# Patient Record
Sex: Female | Born: 1955 | Race: Black or African American | Hispanic: No | Marital: Single | Smoking: Former smoker
Health system: Southern US, Community
[De-identification: ages and names within clinical notes are randomized; demographics above are authoritative.]

## PROBLEM LIST (undated history)

## (undated) DIAGNOSIS — H548 Legal blindness, as defined in USA: Secondary | ICD-10-CM

## (undated) DIAGNOSIS — I10 Essential (primary) hypertension: Secondary | ICD-10-CM

## (undated) DIAGNOSIS — G459 Transient cerebral ischemic attack, unspecified: Secondary | ICD-10-CM

## (undated) DIAGNOSIS — E119 Type 2 diabetes mellitus without complications: Secondary | ICD-10-CM

## (undated) DIAGNOSIS — S99929A Unspecified injury of unspecified foot, initial encounter: Secondary | ICD-10-CM

## (undated) HISTORY — PX: OTHER SURGICAL HISTORY: SHX169

---

## 2020-09-17 ENCOUNTER — Other Ambulatory Visit: Payer: Self-pay

## 2020-09-17 ENCOUNTER — Inpatient Hospital Stay (HOSPITAL_COMMUNITY)
Admission: EM | Admit: 2020-09-17 | Discharge: 2020-09-30 | DRG: 637 | Disposition: A | Discharge status: Skilled Nursing Facility | Payer: Medicare Other | Attending: Internal Medicine | Admitting: Internal Medicine

## 2020-09-17 ENCOUNTER — Emergency Department (HOSPITAL_COMMUNITY): Payer: Medicare Other

## 2020-09-17 ENCOUNTER — Encounter (HOSPITAL_COMMUNITY): Payer: Self-pay | Admitting: Emergency Medicine

## 2020-09-17 ENCOUNTER — Observation Stay (HOSPITAL_COMMUNITY): Payer: Medicare Other

## 2020-09-17 DIAGNOSIS — E1010 Type 1 diabetes mellitus with ketoacidosis without coma: Secondary | ICD-10-CM | POA: Diagnosis not present

## 2020-09-17 DIAGNOSIS — R748 Abnormal levels of other serum enzymes: Secondary | ICD-10-CM | POA: Diagnosis present

## 2020-09-17 DIAGNOSIS — M79674 Pain in right toe(s): Secondary | ICD-10-CM

## 2020-09-17 DIAGNOSIS — M7989 Other specified soft tissue disorders: Secondary | ICD-10-CM

## 2020-09-17 DIAGNOSIS — Z9119 Patient's noncompliance with other medical treatment and regimen: Secondary | ICD-10-CM

## 2020-09-17 DIAGNOSIS — F32A Depression, unspecified: Secondary | ICD-10-CM | POA: Diagnosis present

## 2020-09-17 DIAGNOSIS — Z7902 Long term (current) use of antithrombotics/antiplatelets: Secondary | ICD-10-CM

## 2020-09-17 DIAGNOSIS — Z87891 Personal history of nicotine dependence: Secondary | ICD-10-CM

## 2020-09-17 DIAGNOSIS — R4182 Altered mental status, unspecified: Secondary | ICD-10-CM

## 2020-09-17 DIAGNOSIS — Z515 Encounter for palliative care: Secondary | ICD-10-CM

## 2020-09-17 DIAGNOSIS — Z823 Family history of stroke: Secondary | ICD-10-CM

## 2020-09-17 DIAGNOSIS — R778 Other specified abnormalities of plasma proteins: Secondary | ICD-10-CM | POA: Diagnosis present

## 2020-09-17 DIAGNOSIS — Z794 Long term (current) use of insulin: Secondary | ICD-10-CM

## 2020-09-17 DIAGNOSIS — I10 Essential (primary) hypertension: Secondary | ICD-10-CM | POA: Diagnosis present

## 2020-09-17 DIAGNOSIS — R7989 Other specified abnormal findings of blood chemistry: Secondary | ICD-10-CM | POA: Diagnosis present

## 2020-09-17 DIAGNOSIS — E43 Unspecified severe protein-calorie malnutrition: Secondary | ICD-10-CM | POA: Diagnosis present

## 2020-09-17 DIAGNOSIS — E785 Hyperlipidemia, unspecified: Secondary | ICD-10-CM | POA: Diagnosis present

## 2020-09-17 DIAGNOSIS — G9340 Encephalopathy, unspecified: Secondary | ICD-10-CM | POA: Diagnosis present

## 2020-09-17 DIAGNOSIS — Z8673 Personal history of transient ischemic attack (TIA), and cerebral infarction without residual deficits: Secondary | ICD-10-CM

## 2020-09-17 DIAGNOSIS — G9341 Metabolic encephalopathy: Secondary | ICD-10-CM | POA: Diagnosis present

## 2020-09-17 DIAGNOSIS — H548 Legal blindness, as defined in USA: Secondary | ICD-10-CM | POA: Diagnosis present

## 2020-09-17 DIAGNOSIS — G928 Other toxic encephalopathy: Secondary | ICD-10-CM | POA: Diagnosis present

## 2020-09-17 DIAGNOSIS — Z79899 Other long term (current) drug therapy: Secondary | ICD-10-CM

## 2020-09-17 DIAGNOSIS — E876 Hypokalemia: Secondary | ICD-10-CM | POA: Diagnosis present

## 2020-09-17 DIAGNOSIS — F05 Delirium due to known physiological condition: Secondary | ICD-10-CM | POA: Diagnosis present

## 2020-09-17 DIAGNOSIS — E1110 Type 2 diabetes mellitus with ketoacidosis without coma: Secondary | ICD-10-CM | POA: Diagnosis present

## 2020-09-17 DIAGNOSIS — Z20822 Contact with and (suspected) exposure to covid-19: Secondary | ICD-10-CM | POA: Diagnosis present

## 2020-09-17 DIAGNOSIS — Z781 Physical restraint status: Secondary | ICD-10-CM

## 2020-09-17 DIAGNOSIS — D72829 Elevated white blood cell count, unspecified: Secondary | ICD-10-CM | POA: Diagnosis present

## 2020-09-17 DIAGNOSIS — M6282 Rhabdomyolysis: Secondary | ICD-10-CM | POA: Diagnosis present

## 2020-09-17 DIAGNOSIS — Z9114 Patient's other noncompliance with medication regimen: Secondary | ICD-10-CM

## 2020-09-17 DIAGNOSIS — R627 Adult failure to thrive: Secondary | ICD-10-CM | POA: Diagnosis present

## 2020-09-17 DIAGNOSIS — E860 Dehydration: Secondary | ICD-10-CM | POA: Diagnosis present

## 2020-09-17 HISTORY — DX: Transient cerebral ischemic attack, unspecified: G45.9

## 2020-09-17 HISTORY — DX: Unspecified injury of unspecified foot, initial encounter: S99.929A

## 2020-09-17 HISTORY — DX: Type 2 diabetes mellitus without complications: E11.9

## 2020-09-17 HISTORY — DX: Essential (primary) hypertension: I10

## 2020-09-17 HISTORY — DX: Legal blindness, as defined in USA: H54.8

## 2020-09-17 LAB — CBC WITH DIFFERENTIAL/PLATELET
Abs Immature Granulocytes: 0.17 10*3/uL — ABNORMAL HIGH (ref 0.00–0.07)
Basophils Absolute: 0 10*3/uL (ref 0.0–0.1)
Basophils Relative: 0 %
Eosinophils Absolute: 0 10*3/uL (ref 0.0–0.5)
Eosinophils Relative: 0 %
HCT: 41.8 % (ref 36.0–46.0)
Hemoglobin: 12.4 g/dL (ref 12.0–15.0)
Immature Granulocytes: 1 %
Lymphocytes Relative: 6 %
Lymphs Abs: 1.3 10*3/uL (ref 0.7–4.0)
MCH: 24.9 pg — ABNORMAL LOW (ref 26.0–34.0)
MCHC: 29.7 g/dL — ABNORMAL LOW (ref 30.0–36.0)
MCV: 84.1 fL (ref 80.0–100.0)
Monocytes Absolute: 1.4 10*3/uL — ABNORMAL HIGH (ref 0.1–1.0)
Monocytes Relative: 7 %
Neutro Abs: 17.9 10*3/uL — ABNORMAL HIGH (ref 1.7–7.7)
Neutrophils Relative %: 86 %
Platelets: 254 10*3/uL (ref 150–400)
RBC: 4.97 MIL/uL (ref 3.87–5.11)
RDW: 15.3 % (ref 11.5–15.5)
WBC: 20.7 10*3/uL — ABNORMAL HIGH (ref 4.0–10.5)
nRBC: 0 % (ref 0.0–0.2)

## 2020-09-17 LAB — URINALYSIS, ROUTINE W REFLEX MICROSCOPIC
Bilirubin Urine: NEGATIVE
Glucose, UA: >=500 mg/dL — AB
Ketones, ur: 80 mg/dL — AB
Leukocytes,Ua: NEGATIVE
Nitrite: NEGATIVE
Protein, ur: NEGATIVE mg/dL
Specific Gravity, Urine: 1.024 (ref 1.005–1.030)
pH: 5 (ref 5.0–8.0)

## 2020-09-17 LAB — BASIC METABOLIC PANEL
Anion gap: 20 — ABNORMAL HIGH (ref 5–15)
BUN: 25 mg/dL — ABNORMAL HIGH (ref 8–23)
CO2: 11 mmol/L — ABNORMAL LOW (ref 22–32)
Calcium: 9.6 mg/dL (ref 8.9–10.3)
Chloride: 111 mmol/L (ref 98–111)
Creatinine, Ser: 1.16 mg/dL — ABNORMAL HIGH (ref 0.44–1.00)
GFR, Estimated: 53 mL/min — ABNORMAL LOW (ref >60)
Glucose, Bld: 437 mg/dL — ABNORMAL HIGH (ref 70–99)
Potassium: 4.7 mmol/L (ref 3.5–5.1)
Sodium: 142 mmol/L (ref 135–145)

## 2020-09-17 LAB — COMPREHENSIVE METABOLIC PANEL
ALT: 30 U/L (ref 0–44)
AST: 40 U/L (ref 15–41)
Albumin: 4.3 g/dL (ref 3.5–5.0)
Alkaline Phosphatase: 76 U/L (ref 38–126)
Anion gap: 24 — ABNORMAL HIGH (ref 5–15)
BUN: 23 mg/dL (ref 8–23)
CO2: 11 mmol/L — ABNORMAL LOW (ref 22–32)
Calcium: 10 mg/dL (ref 8.9–10.3)
Chloride: 106 mmol/L (ref 98–111)
Creatinine, Ser: 1.11 mg/dL — ABNORMAL HIGH (ref 0.44–1.00)
GFR, Estimated: 56 mL/min — ABNORMAL LOW (ref >60)
Glucose, Bld: 510 mg/dL (ref 70–99)
Potassium: 4.7 mmol/L (ref 3.5–5.1)
Sodium: 141 mmol/L (ref 135–145)
Total Bilirubin: 1.9 mg/dL — ABNORMAL HIGH (ref 0.3–1.2)
Total Protein: 7.6 g/dL (ref 6.5–8.1)

## 2020-09-17 LAB — RAPID URINE DRUG SCREEN, HOSP PERFORMED
Amphetamines: NOT DETECTED
Barbiturates: NOT DETECTED
Benzodiazepines: NOT DETECTED
Cocaine: NOT DETECTED
Opiates: NOT DETECTED
Tetrahydrocannabinol: NOT DETECTED

## 2020-09-17 LAB — CBG MONITORING, ED
Glucose-Capillary: 141 mg/dL — ABNORMAL HIGH (ref 70–99)
Glucose-Capillary: 144 mg/dL — ABNORMAL HIGH (ref 70–99)
Glucose-Capillary: 257 mg/dL — ABNORMAL HIGH (ref 70–99)
Glucose-Capillary: 420 mg/dL — ABNORMAL HIGH (ref 70–99)
Glucose-Capillary: 447 mg/dL — ABNORMAL HIGH (ref 70–99)
Glucose-Capillary: 460 mg/dL — ABNORMAL HIGH (ref 70–99)
Glucose-Capillary: 471 mg/dL — ABNORMAL HIGH (ref 70–99)

## 2020-09-17 LAB — BLOOD GAS, VENOUS
Acid-base deficit: 14.1 mmol/L — ABNORMAL HIGH (ref 0.0–2.0)
Bicarbonate: 11.4 mmol/L — ABNORMAL LOW (ref 20.0–28.0)
O2 Saturation: 76.3 %
Patient temperature: 98.6
pCO2, Ven: 26.1 mmHg — ABNORMAL LOW (ref 44.0–60.0)
pH, Ven: 7.262 (ref 7.250–7.430)
pO2, Ven: 47.7 mmHg — ABNORMAL HIGH (ref 32.0–45.0)

## 2020-09-17 LAB — RESP PANEL BY RT-PCR (FLU A&B, COVID) ARPGX2
Influenza A by PCR: NEGATIVE
Influenza B by PCR: NEGATIVE
SARS Coronavirus 2 by RT PCR: NEGATIVE

## 2020-09-17 LAB — I-STAT CHEM 8, ED
BUN: 23 mg/dL (ref 8–23)
Calcium, Ion: 1.23 mmol/L (ref 1.15–1.40)
Chloride: 112 mmol/L — ABNORMAL HIGH (ref 98–111)
Creatinine, Ser: 0.7 mg/dL (ref 0.44–1.00)
Glucose, Bld: 522 mg/dL (ref 70–99)
HCT: 42 % (ref 36.0–46.0)
Hemoglobin: 14.3 g/dL (ref 12.0–15.0)
Potassium: 4.9 mmol/L (ref 3.5–5.1)
Sodium: 142 mmol/L (ref 135–145)
TCO2: 13 mmol/L — ABNORMAL LOW (ref 22–32)

## 2020-09-17 LAB — TROPONIN I (HIGH SENSITIVITY): Troponin I (High Sensitivity): 41 ng/L — ABNORMAL HIGH (ref <18)

## 2020-09-17 LAB — LACTIC ACID, PLASMA
Lactic Acid, Venous: 3.1 mmol/L (ref 0.5–1.9)
Lactic Acid, Venous: 2.4 mmol/L (ref 0.5–1.9)

## 2020-09-17 LAB — BETA-HYDROXYBUTYRIC ACID: Beta-Hydroxybutyric Acid: >8 mmol/L — ABNORMAL HIGH (ref 0.05–0.27)

## 2020-09-17 LAB — ETHANOL: Alcohol, Ethyl (B): <10 mg/dL (ref <10)

## 2020-09-17 LAB — CK: Total CK: 529 U/L — ABNORMAL HIGH (ref 38–234)

## 2020-09-17 MED ORDER — DEXTROSE IN LACTATED RINGERS 5 % IV SOLN: INTRAVENOUS | Status: DC

## 2020-09-17 MED ORDER — LACTATED RINGERS IV SOLN: INTRAVENOUS | Status: DC

## 2020-09-17 MED ORDER — INSULIN REGULAR(HUMAN) IN NACL 100-0.9 UT/100ML-% IV SOLN
INTRAVENOUS | Status: DC
  Administered 2020-09-17: 14 [IU]/h via INTRAVENOUS
  Administered 2020-09-18: 2.2 [IU]/h via INTRAVENOUS
  Filled 2020-09-17 (×2): qty 100

## 2020-09-17 MED ORDER — DEXTROSE 50 % IV SOLN
0.0000 mL | INTRAVENOUS | Status: DC | PRN
  Administered 2020-09-22: 50 mL via INTRAVENOUS

## 2020-09-17 MED ORDER — LORAZEPAM 2 MG/ML IJ SOLN
0.5000 mg | Freq: Once | INTRAMUSCULAR | Status: AC
  Administered 2020-09-17: 0.5 mg via INTRAVENOUS
  Filled 2020-09-17: qty 1

## 2020-09-17 MED ORDER — ONDANSETRON HCL 4 MG/2ML IJ SOLN
4.0000 mg | Freq: Once | INTRAMUSCULAR | Status: AC
  Administered 2020-09-17: 4 mg via INTRAVENOUS
  Filled 2020-09-17: qty 2

## 2020-09-17 MED ORDER — SODIUM CHLORIDE 0.9 % IV BOLUS
1000.0000 mL | Freq: Once | INTRAVENOUS | Status: AC
  Administered 2020-09-17: 1000 mL via INTRAVENOUS

## 2020-09-17 MED ORDER — LORAZEPAM 2 MG/ML IJ SOLN
0.5000 mg | Freq: Once | INTRAMUSCULAR | Status: AC
  Administered 2020-09-17: 0.5 mg via INTRAVENOUS

## 2020-09-17 MED ORDER — POTASSIUM CHLORIDE 10 MEQ/100ML IV SOLN
10.0000 meq | INTRAVENOUS | Status: AC
  Administered 2020-09-17 (×2): 10 meq via INTRAVENOUS
  Filled 2020-09-17 (×2): qty 100

## 2020-09-17 NOTE — ED Notes (Signed)
Attempted to take patient to CT, unsuccessful due to patients agitation and movement will try again later. Provider notified

## 2020-09-17 NOTE — ED Notes (Signed)
Patient transported to CT 

## 2020-09-17 NOTE — ED Provider Notes (Signed)
[facility]   CSN: [id number] Arrival date & time: 09/17/20  1717     History Chief Complaint  Patient presents with   Altered Mental Status    Donna AlarCheryl Stairs is a 65 y.o. female.  HPI      64yo female Type 1 DM, htn, depression, history of medication noncompliance presents with concern for altered mental status.   Per EMS they were called out for welfare check when family had not heard from her and found her laying on the floor, covered in feces and urine and altered. Initially only responsive to painful stimuli but becoming more awake and agitated during transport. IV fluid gien.  History limited by altered mental status.  Per sister on phone:   [place] [facility] [place], family begged her to not because she does not take care of herself, particularly with her insulin. She does not take it as she is supposed to, doesn't eat the way she should, tries to live off of lettuce and shrimp.  Ambulance was at her mother's house 3 times a week in [place] due to hypoglycemic episodes when she was living with her mother. Either takes too much insulin or not enough. Family was worried about her moving to [place] as they would not be able to help her.  Lives alone, no family here.  Has been a diabetic since she was 65yo. Thinks depression played role in he rmoving.   No drugs, no etoh while she was at her mom's, no cigarettes Coffee, tea, was drinking beer/wine occasional   They last spoke with her 2 days ago and she did not describe any medical concerns. Sister facetimed with her Sunday for brother's birthday and she was doing well.  Sister does report she has a history of leaving the hospital AMA once she has started improving.    Past Medical History:  Diagnosis Date   Diabetes [facility](HCC)    Hypertension     Patient Active Problem List   Diagnosis Date Noted   Essential hypertension 09/18/2020   Acute metabolic encephalopathy  09/18/2020   Elevated troponin 09/18/2020   Elevated CK 09/18/2020   Elevated lactic acid level 09/18/2020   DKA (diabetic ketoacidosis) (HCC) 09/17/2020    History reviewed. No pertinent surgical history.   OB History   No obstetric history on file.     History reviewed. No pertinent family history.  Social History   Tobacco Use   Smoking status: Never Smoker   Smokeless tobacco: Never Used  Substance Use Topics   Alcohol use: Not Currently    Home Medications Prior to Admission medications   Medication Sig Start Date End Date Taking? Authorizing Provider  amLODipine (NORVASC) 10 MG tablet Take 10 mg by mouth daily. 09/10/20   [provider]  atorvastatin (LIPITOR) 40 MG tablet Take 40 mg by mouth daily. 06/13/20   [provider]  clobetasol cream (TEMOVATE) 0.05 % Apply 1 application topically every other day. 09/05/20   [provider]  clopidogrel (PLAVIX) 75 MG tablet Take 75 mg by mouth daily. 06/13/20   [provider]  hydrocortisone valerate cream (WESTCORT) 0.2 % Apply 1 application topically See admin instructions. APPLY DAILY TO FACE FOR 5 DAY, THEN EVERY OTHER DAY FOR A WEEK, THEN REPEAT 09/08/20   [provider]  insulin lispro (HUMALOG) 100 UNIT/ML KwikPen SMARTSIG:Unit(s) SUB-Q As Directed 09/11/20   [provider]  LANTUS SOLOSTAR 100 UNIT/ML Solostar Pen Inject 8 Units  into the skin at bedtime. 09/05/20   [provider]  lisinopril (ZESTRIL) 20 MG tablet Take 20 mg by mouth daily. 05/30/20   [provider]    Allergies    Patient has no allergy information on record.  Review of Systems   Review of Systems  Unable to perform ROS: Mental status change  Constitutional: Negative for fever (per family 2 days ago).  Respiratory: Negative for cough (per family she did not report this 2 days ago).     Physical Exam Updated Vital Signs BP (!) 141/60    Pulse (!) 113    Temp 99.3  F (37.4 C) (Axillary)    Resp 16    Ht 5\' 5"  (1.651 m)    Wt 65.8 kg    SpO2 99%    BMI 24.13 kg/m   Physical Exam Vitals and nursing note reviewed.  Constitutional:      General: She is not in acute distress.    Appearance: She is well-developed and well-nourished. She is not diaphoretic.  HENT:     Head: Normocephalic.     Comments: Small erythematous mark on forehead Dry mucous membranes Eyes:     Extraocular Movements: EOM normal.     Conjunctiva/sclera: Conjunctivae normal.     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 Tachycardia present.     Pulses: Intact distal pulses.     Heart sounds: Normal heart sounds. No murmur heard. No friction rub. No gallop.   Pulmonary:     Effort: Pulmonary effort is normal. Tachypnea present. No respiratory distress.     Breath sounds: Normal breath sounds. No wheezing or rales.  Abdominal:     General: There is no distension.     Palpations: Abdomen is soft.     Tenderness: There is no abdominal tenderness. There is no guarding.  Musculoskeletal:        General: No tenderness or edema.     Cervical back: Normal range of motion.  Skin:    General: Skin is warm and dry.     Findings: No erythema or rash (a few erythematous papules lower leg).  Neurological:     Mental Status: She is alert.     Comments: States name, location, sleepy, not answering other questions     ED Results / Procedures / Treatments   Labs (all labs ordered are listed, but only abnormal results are displayed) Labs Reviewed  BETA-HYDROXYBUTYRIC ACID - Abnormal; Notable for the following components:      Result Value   Beta-Hydroxybutyric Acid >8.00 (*)    All other components within normal limits  CBC WITH DIFFERENTIAL/PLATELET - Abnormal; Notable for the following components:   WBC 20.7 (*)    MCH 24.9 (*)    MCHC 29.7 (*)    Neutro Abs 17.9 (*)    Monocytes Absolute 1.4 (*)    Abs Immature  Granulocytes 0.17 (*)    All other components within normal limits  URINALYSIS, ROUTINE W REFLEX MICROSCOPIC - Abnormal; Notable for the following components:   Color, Urine STRAW (*)    Glucose, UA >=500 (*)    Hgb urine dipstick SMALL (*)    Ketones, ur 80 (*)    Bacteria, UA RARE (*)    All other components within normal limits  LACTIC ACID, PLASMA - Abnormal; Notable for the following components:   Lactic Acid, Venous 3.1 (*)    All other components  within normal limits  COMPREHENSIVE METABOLIC PANEL - Abnormal; Notable for the following components:   CO2 11 (*)    Glucose, Bld 510 (*)    Creatinine, Ser 1.11 (*)    Total Bilirubin 1.9 (*)    GFR, Estimated 56 (*)    Anion gap 24 (*)    All other components within normal limits  CK - Abnormal; Notable for the following components:   Total CK 529 (*)    All other components within normal limits  BLOOD GAS, VENOUS - Abnormal; Notable for the following components:   pCO2, Ven 26.1 (*)    pO2, Ven 47.7 (*)    Bicarbonate 11.4 (*)    Acid-base deficit 14.1 (*)    All other components within normal limits  BASIC METABOLIC PANEL - Abnormal; Notable for the following components:   CO2 11 (*)    Glucose, Bld 437 (*)    BUN 25 (*)    Creatinine, Ser 1.16 (*)    GFR, Estimated 53 (*)    Anion gap 20 (*)    All other components within normal limits  LACTIC ACID, PLASMA - Abnormal; Notable for the following components:   Lactic Acid, Venous 2.4 (*)    All other components within normal limits  LACTIC ACID, PLASMA - Abnormal; Notable for the following components:   Lactic Acid, Venous 3.6 (*)    All other components within normal limits  BASIC METABOLIC PANEL - Abnormal; Notable for the following components:   Sodium 146 (*)    Chloride 116 (*)    CO2 15 (*)    Glucose, Bld 139 (*)    All other components within normal limits  BASIC METABOLIC PANEL - Abnormal; Notable for the following components:   Sodium 147 (*)    Chloride  120 (*)    CO2 20 (*)    Glucose, Bld 146 (*)    All other components within normal limits  BETA-HYDROXYBUTYRIC ACID - Abnormal; Notable for the following components:   Beta-Hydroxybutyric Acid 3.59 (*)    All other components within normal limits  HEMOGLOBIN A1C - Abnormal; Notable for the following components:   Hgb A1c MFr Bld 7.0 (*)    All other components within normal limits  CK - Abnormal; Notable for the following components:   Total CK 941 (*)    All other components within normal limits  MAGNESIUM - Abnormal; Notable for the following components:   Magnesium 2.5 (*)    All other components within normal limits  GLUCOSE, CAPILLARY - Abnormal; Notable for the following components:   Glucose-Capillary 124 (*)    All other components within normal limits  GLUCOSE, CAPILLARY - Abnormal; Notable for the following components:   Glucose-Capillary 165 (*)    All other components within normal limits  GLUCOSE, CAPILLARY - Abnormal; Notable for the following components:   Glucose-Capillary 156 (*)    All other components within normal limits  GLUCOSE, CAPILLARY - Abnormal; Notable for the following components:   Glucose-Capillary 125 (*)    All other components within normal limits  GLUCOSE, CAPILLARY - Abnormal; Notable for the following components:   Glucose-Capillary 135 (*)    All other components within normal limits  GLUCOSE, CAPILLARY - Abnormal; Notable for the following components:   Glucose-Capillary 165 (*)    All other components within normal limits  GLUCOSE, CAPILLARY - Abnormal; Notable for the following components:   Glucose-Capillary 133 (*)    All other components within  normal limits  CBG MONITORING, ED - Abnormal; Notable for the following components:   Glucose-Capillary 471 (*)    All other components within normal limits  I-STAT CHEM 8, ED - Abnormal; Notable for the following components:   Chloride 112 (*)    Glucose, Bld 522 (*)    TCO2 13 (*)     All other components within normal limits  CBG MONITORING, ED - Abnormal; Notable for the following components:   Glucose-Capillary 447 (*)    All other components within normal limits  CBG MONITORING, ED - Abnormal; Notable for the following components:   Glucose-Capillary 460 (*)    All other components within normal limits  CBG MONITORING, ED - Abnormal; Notable for the following components:   Glucose-Capillary 420 (*)    All other components within normal limits  CBG MONITORING, ED - Abnormal; Notable for the following components:   Glucose-Capillary 257 (*)    All other components within normal limits  CBG MONITORING, ED - Abnormal; Notable for the following components:   Glucose-Capillary 144 (*)    All other components within normal limits  CBG MONITORING, ED - Abnormal; Notable for the following components:   Glucose-Capillary 141 (*)    All other components within normal limits  TROPONIN I (HIGH SENSITIVITY) - Abnormal; Notable for the following components:   Troponin I (High Sensitivity) 41 (*)    All other components within normal limits  TROPONIN I (HIGH SENSITIVITY) - Abnormal; Notable for the following components:   Troponin I (High Sensitivity) 333 (*)    All other components within normal limits  TROPONIN I (HIGH SENSITIVITY) - Abnormal; Notable for the following components:   Troponin I (High Sensitivity) 383 (*)    All other components within normal limits  RESP PANEL BY RT-PCR (FLU A&B, COVID) ARPGX2  MRSA PCR SCREENING  URINE CULTURE  RAPID URINE DRUG SCREEN, HOSP PERFORMED  ETHANOL  AMMONIA  PHOSPHORUS  LACTIC ACID, PLASMA  HIV ANTIBODY (ROUTINE TESTING W REFLEX)  BASIC METABOLIC PANEL  BASIC METABOLIC PANEL  BETA-HYDROXYBUTYRIC ACID  BETA-HYDROXYBUTYRIC ACID  VITAMIN B1  TROPONIN I (HIGH SENSITIVITY)    EKG EKG Interpretation  Date/Time:  Wednesday September 17 2020 18:28:56 EST Ventricular Rate:  126 PR Interval:    QRS Duration: 79 QT  Interval:  309 QTC Calculation: 448 R Axis:   89 Text Interpretation: Sinus tachycardia LAE, consider biatrial enlargement Borderline right axis deviation Minimal ST depression, anterolateral leads No previous ECGs available Confirmed by Alvira Monday ([id number]) on 09/17/2020 10:27:13 PM   Radiology CT Head Wo Contrast  Result Date: 09/17/2020 CLINICAL DATA:  Found on floor altered EXAM: CT HEAD WITHOUT CONTRAST TECHNIQUE: Contiguous axial images were obtained from the base of the skull through the vertex without intravenous contrast. COMPARISON:  None. FINDINGS: Brain: No acute territorial infarction, hemorrhage, or intracranial mass. Mild atrophy. Minimal white matter hypodensity likely chronic small vessel ischemic change. The ventricles are nonenlarged Vascular: No hyperdense vessels.  Carotid vascular calcification Skull: Normal. Negative for fracture or focal lesion. Sinuses/Orbits: Abnormal hyperdense right globe with scleral band. Other: None IMPRESSION: 1. No CT evidence for acute intracranial abnormality. 2. Atrophy and minimal small vessel ischemic changes of the white matter. 3. Abnormal hyperdense right globe, correlate with eye exam Electronically Signed   By: Jasmine Pang M.D.   On: 09/17/2020 23:40   CT Cervical Spine Wo Contrast  Result Date: 09/17/2020 CLINICAL DATA:  Found on floor EXAM: CT CERVICAL SPINE WITHOUT CONTRAST TECHNIQUE:  Multidetector CT imaging of the cervical spine was performed without intravenous contrast. Multiplanar CT image reconstructions were also generated. COMPARISON:  None. FINDINGS: Alignment: Sagittal alignment shows trace anterolisthesis C3 on C4. Facet alignment is within normal limits. There is marked rotation of C1 on C2 presumably related to head positioning. Skull base and vertebrae: No acute fracture. No primary bone lesion or focal pathologic process. Soft tissues and spinal canal: No prevertebral fluid or swelling. No visible canal hematoma. Disc  levels: Mild degenerative changes C4-C5 and C6-C7. Facet degenerative change at multiple levels Upper chest: Negative. Other: None IMPRESSION: No fracture identified. There is marked rotation of C1 on C2 which is presumably related to head positioning. Electronically Signed   By: Jasmine Pang M.D.   On: 09/17/2020 23:46   DG Chest Portable 1 View  Result Date: 09/17/2020 CLINICAL DATA:  Altered level of consciousness, found down EXAM: PORTABLE CHEST 1 VIEW COMPARISON:  None. FINDINGS: The heart size and mediastinal contours are within normal limits. Both lungs are clear. The visualized skeletal structures are unremarkable. IMPRESSION: No active disease. Electronically Signed   By: Sharlet Salina M.D.   On: 09/17/2020 18:33   DG Foot 2 Views Right  Result Date: 09/17/2020 CLINICAL DATA:  Found down, hyperglycemia, pain and swelling right foot EXAM: RIGHT FOOT - 2 VIEW COMPARISON:  None. FINDINGS: Frontal and lateral views of the right foot are obtained. Postsurgical changes are seen from prior ankle and hindfoot fusion. There is diffuse osteoarthritis throughout the midfoot and forefoot. Hammertoe deformities are noted. There are no acute displaced fractures. Diffuse soft tissue edema. No destructive bony lesions or periosteal reaction to suggest osteomyelitis. IMPRESSION: 1. Postsurgical changes from ankle and hindfoot fusion. 2. Diffuse osteoarthritis.  No acute or destructive bony lesions. 3. Diffuse soft tissue swelling. Electronically Signed   By: Sharlet Salina M.D.   On: 09/17/2020 22:56    Procedures .Critical Care Performed by: Alvira Monday, MD Authorized by: Alvira Monday, MD   Critical care provider statement:    Critical care time (minutes):  45   Critical care was time spent personally by me on the following activities:  Discussions with consultants, evaluation of patient's response to treatment, examination of patient, ordering and performing treatments and interventions,  ordering and review of laboratory studies, ordering and review of radiographic studies, pulse oximetry, re-evaluation of patient's condition, obtaining history from patient or surrogate and review of old charts     Medications Ordered in ED Medications  insulin regular, human (MYXREDLIN) 100 units/ 100 mL infusion ( Intravenous Infusion Verify 09/18/20 0806)  lactated ringers infusion (0 mLs Intravenous Stopped 09/17/20 2305)  dextrose 5 % in lactated ringers infusion ( Intravenous New Bag/Given 09/17/20 2309)  dextrose 50 % solution 0-50 mL (has no administration in time range)  lactated ringers infusion (0 mLs Intravenous Stopped 09/18/20 0139)  dextrose 5 % in lactated ringers infusion (0 mLs Intravenous Hold 09/18/20 0053)  Chlorhexidine Gluconate Cloth 2 % PADS 6 each (has no administration in time range)  chlorhexidine (PERIDEX) 0.12 % solution 15 mL (15 mLs Mouth Rinse Given 09/18/20 0139)  MEDLINE mouth rinse (has no administration in time range)  MEDLINE mouth rinse (has no administration in time range)  thiamine (B-1) injection 100 mg (has no administration in time range)  sodium chloride 0.9 % bolus 1,000 mL (0 mLs Intravenous Stopped 09/17/20 2023)  LORazepam (ATIVAN) injection 0.5 mg (0.5 mg Intravenous Given 09/17/20 1832)  potassium chloride 10 mEq in 100 mL IVPB (  0 mEq Intravenous Stopped 09/17/20 2200)  ondansetron (ZOFRAN) injection 4 mg (4 mg Intravenous Given 09/17/20 1945)  LORazepam (ATIVAN) injection 0.5 mg (0.5 mg Intravenous Given 09/17/20 2158)  sodium chloride 0.9 % bolus 1,000 mL (0 mLs Intravenous Stopped 09/17/20 2300)  sodium chloride 0.9 % bolus 1,000 mL ( Intravenous Infusion Verify 09/18/20 0806)    ED Course  I have reviewed the triage vital signs and the nursing notes.  Pertinent labs & imaging results that were available during my care of the patient were reviewed by me and considered in my medical decision making (see chart for details).    MDM Rules/Calculators/A&P                           64yo female Type 1 DM, htn, depression, history of medication noncompliance presents with concern for altered mental status.  Labs consistent with DKA and per sister she has a history of nonadherence to insulin regimens and suspect this is etiology. When they last spoke with her she had no infectious symptoms, does not have signs of UTI or pneumonia.  Suspect leukocytosis and lactic acidosis are secondary to DKA and dehydration.  Given IV fluids, insulin gtt.  Is encephalopathic however protecting her airway and improving from arrival. Suspect encephalopathy also secondary to hyperglycemia and DKA.  Admitted to hospitalist service for further care.  CT head and CSpine ordered given possible fall.   Final Clinical Impression(s) / ED Diagnoses Final diagnoses:  Diabetic ketoacidosis without coma associated with type 1 diabetes mellitus (HCC)  Encephalopathy    Rx / DC Orders ED Discharge Orders    None       Alvira Monday, MD 09/18/20 [phone]

## 2020-09-17 NOTE — ED Triage Notes (Signed)
BIBA Per EMS:  Pt coming from home with altered mental status. Pt was found on floor by ems. Pt CBG was 474 upon EMS arrival. Pt hx diabetes. 20G R hand

## 2020-09-17 NOTE — H&P (Signed)
Donna Harrell [id number] DOB: October 06, 1955 DOA: 09/17/2020    PCP: Oneita HurtPcp, No   Outpatient Specialists:  NONE    Patient arrived to ER on 09/17/20 at 1717 Referred by Attending Alvira MondaySchlossman, Erin, MD   Patient coming from: home Lives alone,     Chief Complaint:  Chief Complaint  Patient presents with   Altered Mental Status    HPI: Donna AlarCheryl Harrell is a 65 y.o. female with medical history significant of DM1, depression, hypertension    Presented with confusion.  Patient does not have regular follow-up.  She lives alone per family has not been consistent in taking her insulin. Was found down after family requested safety check.  On EMS arrival noted to be covered in feces and urine.  Unclear how long she has been in that state. On EMS blood sugar was 474 [place] here on her own in December against her family's wishes. Family stated that she has had very poor diet mainly eating lettuce and shrimp. 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 likely secondary to medical noncompliance resulting in frequent hypoglycemic episodes Family denies that she has history of alcohol or drugs or smoking      Initial COVID TEST  NEGATIVE   Lab Results  Component Value Date   SARSCOV2NAA NEGATIVE 09/17/2020     Regarding pertinent Chronic problems:     HTN on unsure if taking any medications  DM 1- supposed to be on insulin but at this time unable to provide dosages or type of insulin she is taking    While in ER: Noted to be in DKA   Tachycardic up to 121 Beta hydroxybutyric acid above 8 Noted to have leukocytosis likely secondary to hemoconcentration no evidence of infection was noted no fever CK elevated at 529 Given IV bolus fluids  Patient was started on insulin drip Attempted to obtain CT of the head but patient was noncompliant pulling IVs and unable to lay still     Hospitalist was called for admission for DKA  The following Work  up has been ordered so far:  Orders Placed This Encounter  Procedures   Urine culture   Resp Panel by RT-PCR (Flu A&B, Covid) Nasopharyngeal Swab   DG Chest Portable 1 View   CT Head Wo Contrast   CT Cervical Spine Wo Contrast   CBC with Differential (PNL)   Urinalysis, Routine w reflex microscopic   Lactic acid, plasma   Comprehensive metabolic panel   Rapid urine drug screen (hospital performed)   Ethanol   CK   Blood gas, venous   Basic metabolic panel   Diet NPO time specified   Cardiac monitoring   Initiate Carrier Fluid Protocol   Notify physician   If present, discontinue Insulin Pump after IV Insulin is initiated.   Do NOT use lab glucose values in [facility].  If CBG meter reads "Critical High", enter 600.   Upon IV fluid bolus completion, place order for STAT BMET (LAB15) and call provider with results.   Notify physician   If present, discontinue Insulin Pump after IV Insulin is initiated.   Do NOT use lab glucose values in [employer].  If CBG meter reads "Critical High", enter 600.   IV insulin infusion with sufficient glucose should be continued until MD determines acidosis is corrected and places transition orders.   Upon IV fluid bolus completion, place order for STAT BMET (LAB15) and call provider with results.   Consult to  hospitalist   Airborne and Contact precautions   Pulse oximetry, continuous   CBG monitoring, ED   CBG monitoring, ED   I-stat chem 8, ED (not at [facility] or [facility])   CBG monitoring, ED   CBG monitoring, ED   CBG monitoring, ED   CBG monitoring, ED   ED EKG   Insert peripheral IV   Insert peripheral IV    Following Medications were ordered in ER: Medications  insulin regular, human (MYXREDLIN) 100 units/ 100 mL infusion (14 Units/hr Intravenous New Bag/Given 09/17/20 1900)  lactated ringers infusion ( Intravenous New Bag/Given 09/17/20 1849)  dextrose 5 % in lactated ringers infusion (0 mLs Intravenous Hold  09/17/20 1945)  dextrose 50 % solution 0-50 mL (has no administration in time range)  LORazepam (ATIVAN) injection 0.5 mg (has no administration in time range)  sodium chloride 0.9 % bolus 1,000 mL (0 mLs Intravenous Stopped 09/17/20 2023)  LORazepam (ATIVAN) injection 0.5 mg (0.5 mg Intravenous Given 09/17/20 1832)  potassium chloride 10 mEq in 100 mL IVPB (10 mEq Intravenous New Bag/Given 09/17/20 1858)  ondansetron (ZOFRAN) injection 4 mg (4 mg Intravenous Given 09/17/20 1945)        Consult Orders  (From admission, onward)         Start     Ordered   09/17/20 2128  Consult to hospitalist  Once       Provider:  (Not yet assigned)  Question Answer Comment  Place call to: [facility]   Reason for Consult Admit      09/17/20 2127          Significant initial  Findings: Abnormal Labs Reviewed  BETA-HYDROXYBUTYRIC ACID - Abnormal; Notable for the following components:      Result Value   Beta-Hydroxybutyric Acid >8.00 (*)    All other components within normal limits  CBC WITH DIFFERENTIAL/PLATELET - Abnormal; Notable for the following components:   WBC 20.7 (*)    MCH 24.9 (*)    MCHC 29.7 (*)    Neutro Abs 17.9 (*)    Monocytes Absolute 1.4 (*)    Abs Immature Granulocytes 0.17 (*)    All other components within normal limits  URINALYSIS, ROUTINE W REFLEX MICROSCOPIC - Abnormal; Notable for the following components:   Color, Urine STRAW (*)    Glucose, UA >=500 (*)    Hgb urine dipstick SMALL (*)    Ketones, ur 80 (*)    Bacteria, UA RARE (*)    All other components within normal limits  LACTIC ACID, PLASMA - Abnormal; Notable for the following components:   Lactic Acid, Venous 3.1 (*)    All other components within normal limits  COMPREHENSIVE METABOLIC PANEL - Abnormal; Notable for the following components:   CO2 11 (*)    Glucose, Bld 510 (*)    Creatinine, Ser 1.11 (*)    Total Bilirubin 1.9 (*)    GFR, Estimated 56 (*)    Anion gap 24 (*)    All other  components within normal limits  CK - Abnormal; Notable for the following components:   Total CK 529 (*)    All other components within normal limits  BLOOD GAS, VENOUS - Abnormal; Notable for the following components:   pCO2, Ven 26.1 (*)    pO2, Ven 47.7 (*)    Bicarbonate 11.4 (*)    Acid-base deficit 14.1 (*)    All other components within normal limits  BASIC METABOLIC PANEL - Abnormal; Notable for the  following components:   CO2 11 (*)    Glucose, Bld 437 (*)    BUN 25 (*)    Creatinine, Ser 1.16 (*)    GFR, Estimated 53 (*)    Anion gap 20 (*)    All other components within normal limits  CBG MONITORING, ED - Abnormal; Notable for the following components:   Glucose-Capillary 471 (*)    All other components within normal limits  I-STAT CHEM 8, ED - Abnormal; Notable for the following components:   Chloride 112 (*)    Glucose, Bld 522 (*)    TCO2 13 (*)    All other components within normal limits  CBG MONITORING, ED - Abnormal; Notable for the following components:   Glucose-Capillary 447 (*)    All other components within normal limits  CBG MONITORING, ED - Abnormal; Notable for the following components:   Glucose-Capillary 460 (*)    All other components within normal limits  CBG MONITORING, ED - Abnormal; Notable for the following components:   Glucose-Capillary 420 (*)    All other components within normal limits    Otherwise labs showing:    Recent Labs  Lab 09/17/20 1720 09/17/20 1829 09/17/20 2045  NA 141 142 142  K 4.7 4.9 4.7  CO2 11*  --  11*  GLUCOSE 510* 522* 437*  BUN 23 23 25*  CREATININE 1.11* 0.70 1.16*  CALCIUM 10.0  --  9.6    Cr    Up from baseline see below Lab Results  Component Value Date   CREATININE 1.16 (H) 09/17/2020   CREATININE 0.70 09/17/2020   CREATININE 1.11 (H) 09/17/2020    Recent Labs  Lab 09/17/20 1720  AST 40  ALT 30  ALKPHOS 76  BILITOT 1.9*  PROT 7.6  ALBUMIN 4.3   Lab Results  Component Value Date    CALCIUM 9.6 09/17/2020        WBC      Component Value Date/Time   WBC 20.7 (H) 09/17/2020 1720   LYMPHSABS 1.3 09/17/2020 1720   MONOABS 1.4 (H) 09/17/2020 1720   EOSABS 0.0 09/17/2020 1720   BASOSABS 0.0 09/17/2020 1720     Plt: Lab Results  Component Value Date   PLT 254 09/17/2020     Lactic Acid, Venous    Component Value Date/Time   LATICACIDVEN 3.1 (HH) 09/17/2020 1932   Lactic Acid, Venous    Component Value Date/Time   LATICACIDVEN 2.4 (HH) 09/17/2020 2310    Venous  Blood Gas result:  pH 7.262 pCO2 26    ABG    Component Value Date/Time   HCO3 11.4 (L) 09/17/2020 2045   TCO2 13 (L) 09/17/2020 1829   ACIDBASEDEF 14.1 (H) 09/17/2020 2045   O2SAT 76.3 09/17/2020 2045      HG/HCT  stable,       Component Value Date/Time   HGB 14.3 09/17/2020 1829   HCT 42.0 09/17/2020 1829   MCV 84.1 09/17/2020 1720      Troponin 41 Cardiac Panel (last 3 results) Recent Labs    09/17/20 1720  CKTOTAL 529*       ECG: Ordered Personally reviewed by me showing: HR : 126 Rhythm:  Sinus tachycardia    , no evidence of ischemic changes QTC 448    DM  labs:  HbA1C: No results for input(s): HGBA1C in the last 8760 hours.     CBG (last 3)  Recent Labs    09/17/20 2154 09/17/20 2302 09/17/20 2354  GLUCAP 257* 144* 141*       UA no evidence of UTI      Urine analysis:    Component Value Date/Time   COLORURINE STRAW (A) 09/17/2020 1932   APPEARANCEUR CLEAR 09/17/2020 1932   LABSPEC 1.024 09/17/2020 1932   PHURINE 5.0 09/17/2020 1932   GLUCOSEU >=500 (A) 09/17/2020 1932   HGBUR SMALL (A) 09/17/2020 1932   BILIRUBINUR NEGATIVE 09/17/2020 1932   KETONESUR 80 (A) 09/17/2020 1932   PROTEINUR NEGATIVE 09/17/2020 1932   NITRITE NEGATIVE 09/17/2020 1932   LEUKOCYTESUR NEGATIVE 09/17/2020 1932    CT HEAD  Ordered   CXR - NON acute  Right foot - imaging ordered    ED Triage Vitals  Enc Vitals Group     BP 09/17/20 1807 (!) 153/71     Pulse  Rate 09/17/20 1807 (!) 126     Resp 09/17/20 1807 13     Temp 09/17/20 1807 98.7 F (37.1 C)     Temp Source 09/17/20 1807 Rectal     SpO2 09/17/20 1807 100 %     Weight 09/17/20 1856 145 lb (65.8 kg)     Height 09/17/20 1856  (1.651 m)     Head Circumference --      Peak Flow --      Pain Score 09/17/20 1757 0     Pain Loc --      Pain Edu? --      Excl. in GC? --   TMAX(24)@       Latest  Blood pressure (!) 143/115, pulse (!) 122, temperature 98.7 F (37.1 C), temperature source Rectal, resp. rate 19, height  (1.651 m), weight 65.8 kg, SpO2 100 %.      Review of Systems:    Pertinent positives include: confusion  Constitutional:  No weight loss, night sweats, Fevers, chills, fatigue, weight loss  HEENT:  No headaches, Difficulty swallowing,Tooth/dental problems,Sore throat,  No sneezing, itching, ear ache, nasal congestion, post nasal drip,  Cardio-vascular:  No chest pain, Orthopnea, PND, anasarca, dizziness, palpitations.no Bilateral lower extremity swelling  GI:  No heartburn, indigestion, abdominal pain, nausea, vomiting, diarrhea, change in bowel habits, loss of appetite, melena, blood in stool, hematemesis Resp:  no shortness of breath at rest. No dyspnea on exertion, No excess mucus, no productive cough, No non-productive cough, No coughing up of blood.No change in color of mucus.No wheezing. Skin:  no rash or lesions. No jaundice GU:  no dysuria, change in color of urine, no urgency or frequency. No straining to urinate.  No flank pain.  Musculoskeletal:  No joint pain or no joint swelling. No decreased range of motion. No back pain.  Psych:  No change in mood or affect. No depression or anxiety. No memory loss.  Neuro: no localizing neurological complaints, no tingling, no weakness, no double vision, no gait abnormality, no slurred speech, no confusion  All systems reviewed and apart from HOPI all are negative  Past Medical History:  History  reviewed. No pertinent past medical history.     Social History:  Ambulatory   Independently     reports that she has never smoked. She has never used smokeless tobacco. She reports previous alcohol use. No history on file for drug use.     Family History: Unable to obtain due to pt mental status Allergies: patient unable to provide, family unreachable by phone  Prior to Admission medications   Not on File   Physical Exam: Vitals with BMI  09/17/2020 09/17/2020 09/17/2020  Height - - -  Weight - - -  BMI - - -  Systolic 143 122 604  Diastolic 115 63 79  Pulse 122 540 127     1. General:  in   Acute distress  agitated  Chronically ill  -appearing 2. Psychological: Alert not Oriented 3. Head/ENT:     Dry Mucous Membranes                          Head Non traumatic, neck supple                            Poor Dentition                               PERLA 4. SKIN:  decreased Skin turgor,  Skin clean Dry and intact no rash 5. Heart: Regular rate and rhythm no  Murmur, no Rub or gallop 6. Lungs:  no wheezes or crackles   7. Abdomen: Soft,  non-tender, Non distended bowel sounds present 8. Lower extremities: no clubbing, cyanosis,trace edema 9. Neurologically Grossly intact, moving all 4 extremities equally   10. MSK: Normal range of motion   All other LABS:     Recent Labs  Lab 09/17/20 1720 09/17/20 1829  WBC 20.7*  --   NEUTROABS 17.9*  --   HGB 12.4 14.3  HCT 41.8 42.0  MCV 84.1  --   PLT 254  --      Recent Labs  Lab 09/17/20 1720 09/17/20 1829 09/17/20 2045  NA 141 142 142  K 4.7 4.9 4.7  CL 106 112* 111  CO2 11*  --  11*  GLUCOSE 510* 522* 437*  BUN 23 23 25*  CREATININE 1.11* 0.70 1.16*  CALCIUM 10.0  --  9.6     Recent Labs  Lab 09/17/20 1720  AST 40  ALT 30  ALKPHOS 76  BILITOT 1.9*  PROT 7.6  ALBUMIN 4.3       Cultures: No results found for: SDES, SPECREQUEST, CULT, REPTSTATUS   Radiological Exams on Admission: DG Chest Portable  1 View  Result Date: 09/17/2020 CLINICAL DATA:  Altered level of consciousness, found down EXAM: PORTABLE CHEST 1 VIEW COMPARISON:  None. FINDINGS: The heart size and mediastinal contours are within normal limits. Both lungs are clear. The visualized skeletal structures are unremarkable. IMPRESSION: No active disease. Electronically Signed   By: Sharlet Salina M.D.   On: 09/17/2020 18:33    Chart has been reviewed    Assessment/Plan  65 y.o. female with medical history significant of DM1, depression, hypertension  Admitted for DKA and acute encephalopathy  Present on Admission:  DKA (diabetic ketoacidosis) (HCC) - will admit per DKA  protocol, obtain serial BMET, start on glucosestabalizer, aggressive IVF.   Change IVF to D5 1/2Na after BG <250 .  So far work up of possible causes of DKA/HSS with CXR, ECG one set of cardiac enzymes, UA.   Most likely cause been noncompliance Monitor in Stepdown. Replace potassium as needed.    Consult diabetes coordinator     Essential hypertension - continue to monitor BP currently stable   Acute metabolic encephalopathy -likely combination of dehydration DKA, unclear baseline Check ammonia level CT head showed no acute intracranial abnormalities If patient able to tolerate may need MRI in the future  CT head abnormal- right eye hyperdensity of unclear significance Pupil appeared to be reactive patient unable to provide any history She does have underlying poorly controlled diabetes At this point would not tolerate ophthalmological examination Would obtain ophthalmology consult in the a.m. once patient mental status hopefully improves No evidence of trauma around the eye noted   Elevated troponin -no evidence of ischemia on EKG continue to follow cycle cardiac enzymes obtain echogram   Elevated CK -rehydrate and continue to follow   Elevated lactic acid level likely in the setting of dehydration will rehydrate and  follow  Leukocytosis unclear etiology at this point no evidence of infectious process we will continue to follow  Patient was on Plavix will need to speak with family once able to to obtain more history of why patient is taking it. Given abnormal CT hold off on Plavix for tonight until cleared by ophthalmology  Other plan as per orders.  DVT prophylaxis:  SCD     Code Status:    Code Status: Not on file FULL CODE     Family Communication:   Family not at  Bedside  attempted to call with no answer  Disposition Plan:       To home once workup is complete and patient is stable   Following barriers for discharge:                            Electrolytes corrected                                                         Will need consultants to evaluate patient prior to discharge                         Would benefit from PT/OT eval prior to DC  Ordered                                       Diabetes care coordinator                  Consults called: none, will need ophthalmology consult in AM   Admission status:  ED Disposition    ED Disposition Condition Comment   Admit  Hospital Area: [facility] [[id number]]  Level of Care: Stepdown [14]  Admit to SDU based on following criteria: Hemodynamic compromise or significant risk of instability:  Patient requiring short term acute titration and management of vasoactive drips, and invasive monitoring (i.e., CVP and Arterial line).  Covid Evaluation: Confirmed COVID Negative  Diagnosis: DKA (diabetic ketoacidosis) (HCC) [891694]  Admitting Physician: Therisa Doyne [[id number]]  Attending Physician: Therisa Doyne [[id number]]       Obs     Level of care         SDU tele indefinitely please discontinue once patient no longer qualifies COVID-19 Labs    No results found for: SARSCOV2NAA   Precautions: admitted as  Covid Negative     PPE: Used by the provider:   N95  eye Goggles,  Gloves     Pal Shell 09/17/2020, 12:50 AM    [facility]     after 2  AM please page floor coverage PA If 7AM-7PM, please contact the day team taking care of the patient using [email or web address]   Patient was evaluated in the context of the global COVID-19 pandemic, which necessitated consideration that the patient might be at risk for infection with the SARS-CoV-2 virus that causes COVID-19. Institutional protocols and algorithms that pertain to the evaluation of patients at risk for COVID-19 are in a state of rapid change based on information released by regulatory bodies including the CDC and federal and state organizations. These policies and algorithms were followed during the patient's care.

## 2020-09-17 NOTE — ED Notes (Signed)
XR at bedside

## 2020-09-18 ENCOUNTER — Inpatient Hospital Stay (HOSPITAL_COMMUNITY): Payer: Medicare Other

## 2020-09-18 ENCOUNTER — Encounter (HOSPITAL_COMMUNITY): Payer: Self-pay | Admitting: Internal Medicine

## 2020-09-18 ENCOUNTER — Observation Stay (HOSPITAL_COMMUNITY): Payer: Medicare Other

## 2020-09-18 ENCOUNTER — Observation Stay (HOSPITAL_COMMUNITY)
Admit: 2020-09-18 | Discharge: 2020-09-18 | Disposition: A | Discharge status: Home / Self Care | Payer: Medicare Other | Attending: Family Medicine | Admitting: Family Medicine

## 2020-09-18 ENCOUNTER — Other Ambulatory Visit: Payer: Self-pay

## 2020-09-18 DIAGNOSIS — G9340 Encephalopathy, unspecified: Secondary | ICD-10-CM | POA: Diagnosis present

## 2020-09-18 DIAGNOSIS — Z9114 Patient's other noncompliance with medication regimen: Secondary | ICD-10-CM | POA: Diagnosis not present

## 2020-09-18 DIAGNOSIS — R7989 Other specified abnormal findings of blood chemistry: Secondary | ICD-10-CM

## 2020-09-18 DIAGNOSIS — G9341 Metabolic encephalopathy: Secondary | ICD-10-CM | POA: Diagnosis present

## 2020-09-18 DIAGNOSIS — R000 Tachycardia, unspecified: Secondary | ICD-10-CM

## 2020-09-18 DIAGNOSIS — R627 Adult failure to thrive: Secondary | ICD-10-CM | POA: Diagnosis present

## 2020-09-18 DIAGNOSIS — E785 Hyperlipidemia, unspecified: Secondary | ICD-10-CM | POA: Diagnosis present

## 2020-09-18 DIAGNOSIS — E876 Hypokalemia: Secondary | ICD-10-CM | POA: Diagnosis present

## 2020-09-18 DIAGNOSIS — D72829 Elevated white blood cell count, unspecified: Secondary | ICD-10-CM | POA: Diagnosis present

## 2020-09-18 DIAGNOSIS — I10 Essential (primary) hypertension: Secondary | ICD-10-CM

## 2020-09-18 DIAGNOSIS — M6282 Rhabdomyolysis: Secondary | ICD-10-CM | POA: Diagnosis present

## 2020-09-18 DIAGNOSIS — Z515 Encounter for palliative care: Secondary | ICD-10-CM | POA: Diagnosis not present

## 2020-09-18 DIAGNOSIS — E1010 Type 1 diabetes mellitus with ketoacidosis without coma: Secondary | ICD-10-CM | POA: Diagnosis present

## 2020-09-18 DIAGNOSIS — H548 Legal blindness, as defined in USA: Secondary | ICD-10-CM | POA: Diagnosis present

## 2020-09-18 DIAGNOSIS — R748 Abnormal levels of other serum enzymes: Secondary | ICD-10-CM | POA: Diagnosis not present

## 2020-09-18 DIAGNOSIS — F32A Depression, unspecified: Secondary | ICD-10-CM | POA: Diagnosis present

## 2020-09-18 DIAGNOSIS — R630 Anorexia: Secondary | ICD-10-CM | POA: Diagnosis not present

## 2020-09-18 DIAGNOSIS — R778 Other specified abnormalities of plasma proteins: Secondary | ICD-10-CM

## 2020-09-18 DIAGNOSIS — Z20822 Contact with and (suspected) exposure to covid-19: Secondary | ICD-10-CM | POA: Diagnosis present

## 2020-09-18 DIAGNOSIS — Z823 Family history of stroke: Secondary | ICD-10-CM | POA: Diagnosis not present

## 2020-09-18 DIAGNOSIS — M7989 Other specified soft tissue disorders: Secondary | ICD-10-CM | POA: Diagnosis not present

## 2020-09-18 DIAGNOSIS — E860 Dehydration: Secondary | ICD-10-CM | POA: Diagnosis present

## 2020-09-18 DIAGNOSIS — Z8673 Personal history of transient ischemic attack (TIA), and cerebral infarction without residual deficits: Secondary | ICD-10-CM | POA: Diagnosis not present

## 2020-09-18 DIAGNOSIS — Z7902 Long term (current) use of antithrombotics/antiplatelets: Secondary | ICD-10-CM | POA: Diagnosis not present

## 2020-09-18 DIAGNOSIS — G928 Other toxic encephalopathy: Secondary | ICD-10-CM | POA: Diagnosis present

## 2020-09-18 DIAGNOSIS — Z794 Long term (current) use of insulin: Secondary | ICD-10-CM | POA: Diagnosis not present

## 2020-09-18 DIAGNOSIS — F05 Delirium due to known physiological condition: Secondary | ICD-10-CM | POA: Diagnosis present

## 2020-09-18 DIAGNOSIS — R609 Edema, unspecified: Secondary | ICD-10-CM | POA: Diagnosis not present

## 2020-09-18 DIAGNOSIS — Z9119 Patient's noncompliance with other medical treatment and regimen: Secondary | ICD-10-CM | POA: Diagnosis not present

## 2020-09-18 DIAGNOSIS — R4182 Altered mental status, unspecified: Secondary | ICD-10-CM | POA: Diagnosis present

## 2020-09-18 DIAGNOSIS — E43 Unspecified severe protein-calorie malnutrition: Secondary | ICD-10-CM | POA: Diagnosis present

## 2020-09-18 DIAGNOSIS — Z87891 Personal history of nicotine dependence: Secondary | ICD-10-CM | POA: Diagnosis not present

## 2020-09-18 DIAGNOSIS — Z79899 Other long term (current) drug therapy: Secondary | ICD-10-CM | POA: Diagnosis not present

## 2020-09-18 LAB — BETA-HYDROXYBUTYRIC ACID
Beta-Hydroxybutyric Acid: 3.59 mmol/L — ABNORMAL HIGH (ref 0.05–0.27)
Beta-Hydroxybutyric Acid: 0.55 mmol/L — ABNORMAL HIGH (ref 0.05–0.27)
Beta-Hydroxybutyric Acid: 0.16 mmol/L (ref 0.05–0.27)

## 2020-09-18 LAB — BASIC METABOLIC PANEL
Anion gap: 15 (ref 5–15)
Anion gap: 11 (ref 5–15)
Anion gap: 8 (ref 5–15)
BUN: 22 mg/dL (ref 8–23)
BUN: 18 mg/dL (ref 8–23)
BUN: 16 mg/dL (ref 8–23)
CO2: 15 mmol/L — ABNORMAL LOW (ref 22–32)
CO2: 16 mmol/L — ABNORMAL LOW (ref 22–32)
CO2: 21 mmol/L — ABNORMAL LOW (ref 22–32)
Calcium: 9.4 mg/dL (ref 8.9–10.3)
Calcium: 9.2 mg/dL (ref 8.9–10.3)
Calcium: 9.5 mg/dL (ref 8.9–10.3)
Chloride: 116 mmol/L — ABNORMAL HIGH (ref 98–111)
Chloride: 119 mmol/L — ABNORMAL HIGH (ref 98–111)
Chloride: 119 mmol/L — ABNORMAL HIGH (ref 98–111)
Creatinine, Ser: 0.86 mg/dL (ref 0.44–1.00)
Creatinine, Ser: 0.81 mg/dL (ref 0.44–1.00)
Creatinine, Ser: 0.79 mg/dL (ref 0.44–1.00)
GFR, Estimated: >60 mL/min (ref >60)
GFR, Estimated: >60 mL/min (ref >60)
GFR, Estimated: >60 mL/min (ref >60)
Glucose, Bld: 139 mg/dL — ABNORMAL HIGH (ref 70–99)
Glucose, Bld: 281 mg/dL — ABNORMAL HIGH (ref 70–99)
Glucose, Bld: 186 mg/dL — ABNORMAL HIGH (ref 70–99)
Potassium: 4.8 mmol/L (ref 3.5–5.1)
Potassium: 4.8 mmol/L (ref 3.5–5.1)
Potassium: 3.8 mmol/L (ref 3.5–5.1)
Sodium: 146 mmol/L — ABNORMAL HIGH (ref 135–145)
Sodium: 146 mmol/L — ABNORMAL HIGH (ref 135–145)
Sodium: 148 mmol/L — ABNORMAL HIGH (ref 135–145)

## 2020-09-18 LAB — PHOSPHORUS: Phosphorus: 2.9 mg/dL (ref 2.5–4.6)

## 2020-09-18 LAB — GLUCOSE, CAPILLARY
Glucose-Capillary: 124 mg/dL — ABNORMAL HIGH (ref 70–99)
Glucose-Capillary: 165 mg/dL — ABNORMAL HIGH (ref 70–99)
Glucose-Capillary: 156 mg/dL — ABNORMAL HIGH (ref 70–99)
Glucose-Capillary: 125 mg/dL — ABNORMAL HIGH (ref 70–99)
Glucose-Capillary: 135 mg/dL — ABNORMAL HIGH (ref 70–99)
Glucose-Capillary: 165 mg/dL — ABNORMAL HIGH (ref 70–99)
Glucose-Capillary: 133 mg/dL — ABNORMAL HIGH (ref 70–99)
Glucose-Capillary: 138 mg/dL — ABNORMAL HIGH (ref 70–99)
Glucose-Capillary: 173 mg/dL — ABNORMAL HIGH (ref 70–99)
Glucose-Capillary: 187 mg/dL — ABNORMAL HIGH (ref 70–99)
Glucose-Capillary: 204 mg/dL — ABNORMAL HIGH (ref 70–99)
Glucose-Capillary: 182 mg/dL — ABNORMAL HIGH (ref 70–99)
Glucose-Capillary: 198 mg/dL — ABNORMAL HIGH (ref 70–99)
Glucose-Capillary: 164 mg/dL — ABNORMAL HIGH (ref 70–99)
Glucose-Capillary: 186 mg/dL — ABNORMAL HIGH (ref 70–99)
Glucose-Capillary: 162 mg/dL — ABNORMAL HIGH (ref 70–99)
Glucose-Capillary: 147 mg/dL — ABNORMAL HIGH (ref 70–99)
Glucose-Capillary: 179 mg/dL — ABNORMAL HIGH (ref 70–99)
Glucose-Capillary: 185 mg/dL — ABNORMAL HIGH (ref 70–99)
Glucose-Capillary: 176 mg/dL — ABNORMAL HIGH (ref 70–99)
Glucose-Capillary: 196 mg/dL — ABNORMAL HIGH (ref 70–99)
Glucose-Capillary: 209 mg/dL — ABNORMAL HIGH (ref 70–99)

## 2020-09-18 LAB — BASIC METABOLIC PANEL WITH GFR
Anion gap: 7 (ref 5–15)
BUN: 20 mg/dL (ref 8–23)
CO2: 20 mmol/L — ABNORMAL LOW (ref 22–32)
Calcium: 9 mg/dL (ref 8.9–10.3)
Chloride: 120 mmol/L — ABNORMAL HIGH (ref 98–111)
Creatinine, Ser: 0.82 mg/dL (ref 0.44–1.00)
GFR, Estimated: >60 mL/min
Glucose, Bld: 146 mg/dL — ABNORMAL HIGH (ref 70–99)
Potassium: 3.9 mmol/L (ref 3.5–5.1)
Sodium: 147 mmol/L — ABNORMAL HIGH (ref 135–145)

## 2020-09-18 LAB — TROPONIN I (HIGH SENSITIVITY)
Troponin I (High Sensitivity): 333 ng/L (ref <18)
Troponin I (High Sensitivity): 383 ng/L (ref <18)
Troponin I (High Sensitivity): 652 ng/L (ref <18)
Troponin I (High Sensitivity): 683 ng/L
Troponin I (High Sensitivity): 698 ng/L (ref <18)
Troponin I (High Sensitivity): 759 ng/L (ref <18)

## 2020-09-18 LAB — ECHOCARDIOGRAM COMPLETE
Area-P 1/2: 3.27 cm2
Height: 65 in
S' Lateral: 1.7 cm
Weight: 2320 [oz_av]

## 2020-09-18 LAB — MRSA PCR SCREENING: MRSA by PCR: NEGATIVE

## 2020-09-18 LAB — HEMOGLOBIN A1C
Hgb A1c MFr Bld: 7 % — ABNORMAL HIGH (ref 4.8–5.6)
Mean Plasma Glucose: 154.2 mg/dL

## 2020-09-18 LAB — MAGNESIUM: Magnesium: 2.5 mg/dL — ABNORMAL HIGH (ref 1.7–2.4)

## 2020-09-18 LAB — HIV ANTIBODY (ROUTINE TESTING W REFLEX): HIV Screen 4th Generation wRfx: NONREACTIVE

## 2020-09-18 LAB — AMMONIA: Ammonia: 11 umol/L (ref 9–35)

## 2020-09-18 LAB — CK: Total CK: 941 U/L — ABNORMAL HIGH (ref 38–234)

## 2020-09-18 LAB — LACTIC ACID, PLASMA
Lactic Acid, Venous: 3.6 mmol/L (ref 0.5–1.9)
Lactic Acid, Venous: 1.1 mmol/L (ref 0.5–1.9)

## 2020-09-18 MED ORDER — ORAL CARE MOUTH RINSE
15.0000 mL | Freq: Two times a day (BID) | OROMUCOSAL | Status: DC
  Administered 2020-09-19: 15 mL via OROMUCOSAL

## 2020-09-18 MED ORDER — ACETAMINOPHEN 650 MG RE SUPP
650.0000 mg | Freq: Four times a day (QID) | RECTAL | Status: DC | PRN
  Administered 2020-09-18: 650 mg via RECTAL
  Filled 2020-09-18: qty 1

## 2020-09-18 MED ORDER — THIAMINE HCL 100 MG/ML IJ SOLN: 100.0000 mg | Freq: Every day | INTRAMUSCULAR | Status: DC

## 2020-09-18 MED ORDER — HALOPERIDOL LACTATE 5 MG/ML IJ SOLN
5.0000 mg | Freq: Once | INTRAMUSCULAR | Status: AC
  Administered 2020-09-18: 5 mg via INTRAVENOUS
  Filled 2020-09-18: qty 1

## 2020-09-18 MED ORDER — ORAL CARE MOUTH RINSE
15.0000 mL | Freq: Two times a day (BID) | OROMUCOSAL | Status: DC
  Administered 2020-09-18 – 2020-09-26 (×13): 15 mL via OROMUCOSAL

## 2020-09-18 MED ORDER — THIAMINE HCL 100 MG/ML IJ SOLN: 500.0000 mg | Freq: Three times a day (TID) | INTRAVENOUS | Status: DC

## 2020-09-18 MED ORDER — SODIUM CHLORIDE 0.9 % IV SOLN
INTRAVENOUS | Status: DC | PRN
  Administered 2020-09-18 (×2): 500 mL via INTRAVENOUS

## 2020-09-18 MED ORDER — THIAMINE HCL 100 MG/ML IJ SOLN
500.0000 mg | Freq: Three times a day (TID) | INTRAVENOUS | Status: AC
  Administered 2020-09-18 – 2020-09-20 (×9): 500 mg via INTRAVENOUS
  Filled 2020-09-18 (×9): qty 5

## 2020-09-18 MED ORDER — METOPROLOL TARTRATE 5 MG/5ML IV SOLN
2.5000 mg | Freq: Three times a day (TID) | INTRAVENOUS | Status: DC
  Administered 2020-09-18: 2.5 mg via INTRAVENOUS
  Filled 2020-09-18: qty 5

## 2020-09-18 MED ORDER — CHLORHEXIDINE GLUCONATE 0.12 % MT SOLN
15.0000 mL | Freq: Two times a day (BID) | OROMUCOSAL | Status: DC
  Administered 2020-09-18 – 2020-09-29 (×17): 15 mL via OROMUCOSAL
  Filled 2020-09-18 (×19): qty 15

## 2020-09-18 MED ORDER — DEXTROSE IN LACTATED RINGERS 5 % IV SOLN: INTRAVENOUS | Status: DC

## 2020-09-18 MED ORDER — SODIUM CHLORIDE 0.9 % IV BOLUS
1000.0000 mL | Freq: Once | INTRAVENOUS | Status: AC
  Administered 2020-09-18: 1000 mL via INTRAVENOUS

## 2020-09-18 MED ORDER — CHLORHEXIDINE GLUCONATE CLOTH 2 % EX PADS
6.0000 | MEDICATED_PAD | Freq: Every day | CUTANEOUS | Status: DC
  Administered 2020-09-18 – 2020-09-25 (×8): 6 via TOPICAL

## 2020-09-18 MED ORDER — LACTATED RINGERS IV SOLN: INTRAVENOUS | Status: DC

## 2020-09-18 NOTE — Progress Notes (Signed)
Spoke with RN. MRI on hold until RN is able to come down.

## 2020-09-18 NOTE — Progress Notes (Signed)
Date and time results received: 09/18/20 1300 (use smartphrase ".now" to insert current time)  Test: troponin  Critical Value: 698   Name of Provider Notified: [place]    Orders Received? Or Actions Taken?: Actions Taken: continue to carefully monitor

## 2020-09-18 NOTE — Procedures (Signed)
Patient Name: Reniyah Gootee  MRN: [id number]  Epilepsy Attending: Charlsie Quest  Referring Physician/Provider: Dr Mauro Kaufmann Date: 09/18/2020 Duration: 24.21 mins  Patient history: 65 year old female with altered mental status.  EEG evaluate for seizures.  Level of alertness: Awake, asleep  AEDs during EEG study: None  Technical aspects: This EEG study was done with scalp electrodes positioned according to the 10-20 International system of electrode placement. Electrical activity was acquired at a sampling rate of 500Hz  and reviewed with a high frequency filter of 70Hz  and a low frequency filter of 1Hz . EEG data were recorded continuously and digitally stored.   Description: No clear posterior dominant rhythm was seen.  Sleep was characterized by vertex waves, sleep spindles (12 to 14 Hz), maximal frontocentral region.  EEG showed continuous generalized 5 to 6 Hz theta slowing as well as intermittent generalized 2 to 3 Hz delta slowing. Hyperventilation and photic stimulation were not performed.     ABNORMALITY -Continuous slow, generalized  IMPRESSION: This study is suggestive of moderate diffuse encephalopathy, nonspecific etiology. No seizures or epileptiform discharges were seen throughout the recording.  Hadlei Stitt 

## 2020-09-18 NOTE — Progress Notes (Signed)
[facility]  PROGRESS NOTE  Donna Harrell ESP:[id number] DOB: 1955/11/11 DOA: 09/17/2020 PCP: Pcp, No   Brief HPI:   65 year old female with medical history of diabetes mellitus type 1, depression, hypertension, was presented with confusion.  Patient lives alone per family and has not been consistent taking her insulin.  Also patient has been very insistent on losing diet and has been eating lettuce and shame.  She has had multiple episodes of hypoglycemia as per sister and many times EMS has been called for that.  She is also not compliant with taking insulin.  When EMS was called to home she was found covered in feces and urine.  As per patient's sister for past 2 days they were unable to contact her. In the ED patient was found to be in DKA also altered mental status.  She was started on IV insulin per DKA protocol.  CT head was unremarkable.    Subjective   Patient seen and examined, she is more alert however not oriented x3.  She just mumbles, not following commands.   Assessment/Plan:     1. Metabolic encephalopathy-unclear etiology, likely recurrent hypoglycemic episodes.  As per patient's sister she was having multiple hypoglycemic episodes at home for which EMS was called repeatedly.  Patient was insistent on losing weight and has been only eating lettuce and shrimp 4 days.  Also not been compliant with insulin.  CT head was unremarkable, will obtain MRI brain, EEG.  I have called and discussed with neurology, Dr. Amada Jupiter, will also start her on thiamine 500 mg IV every 8 hours for 3 days.  Thiamine level has already been drawn.  We will follow labs.  Ammonia level is 11, HIV nonreactive.  Encephalopathy could also be due to DKA, will continue to follow. 2. Diabetic ketoacidosis-resolved, patient presented with diabetic ketoacidosis.  Started on IV insulin per DKA protocol.  We will continue with IV insulin, she still has acidosis with CO2 16.  We will continue with IV  insulin till acidosis resolved. 3. Elevated troponin/?  NSTEMI-patient's troponin went up to 683, EKG showed nonspecific ST changes.  Will consult cardiology for further recommendations. 4. Elevated CK-likely from being on the floor for 2 days.  Started on rehydration as above for DKA.  Follow CK level in a.m. 5. Lactic acidosis-patient has complained anion gap metabolic acidosis as well as non-anion gap metabolic acidosis.  Likely from dehydration.  Will follow lactic acid level after rehydration. 6. 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  Patient's right eyeball was found to be hyperdense on CT head.  I called and discussed with patient's sister who says that she had 2 surgeries on right eye but her vision could not be restored.      COVID-19 Labs  No results for input(s): DDIMER, FERRITIN, LDH, CRP in the last 72 hours.  Lab Results  Component Value Date   SARSCOV2NAA NEGATIVE 09/17/2020     Scheduled medications:   . chlorhexidine  15 mL Mouth Rinse BID  . Chlorhexidine Gluconate Cloth  6 each Topical Daily  . mouth rinse  15 mL Mouth Rinse q12n4p  . mouth rinse  15 mL Mouth Rinse q12n4p         CBG: Recent Labs  Lab 09/18/20 0802 09/18/20 0908 09/18/20 1010 09/18/20 1111 09/18/20 1212  GLUCAP 133* 138* 173* 187* 204*    SpO2: 100 %    CBC: Recent Labs  Lab 09/17/20 1720 09/17/20 1829  WBC 20.7*  --  NEUTROABS 17.9*  --   HGB 12.4 14.3  HCT 41.8 42.0  MCV 84.1  --   PLT 254  --     Basic Metabolic Panel: Recent Labs  Lab 09/17/20 1720 09/17/20 1829 09/17/20 2045 09/18/20 0130 09/18/20 0757 09/18/20 1118  NA 141 142 142 146* 147* 146*  K 4.7 4.9 4.7 4.8 3.9 4.8  CL 106 112* 111 116* 120* 119*  CO2 11*  --  11* 15* 20* 16*  GLUCOSE 510* 522* 437* 139* 146* 281*  BUN 23 23 25* 22 20 18   CREATININE 1.11* 0.70 1.16* 0.86 0.82 0.81  CALCIUM 10.0  --  9.6 9.4 9.0 9.2  MG  --   --   --  2.5*  --   --   PHOS  --   --   --  2.9   --   --      Liver Function Tests: Recent Labs  Lab 09/17/20 1720  AST 40  ALT 30  ALKPHOS 76  BILITOT 1.9*  PROT 7.6  ALBUMIN 4.3     Antibiotics: Anti-infectives (From admission, onward)   None       DVT prophylaxis: SCDs  Code Status: Full code  Family Communication: Discussed with patient sister on phone   Consultants:    Procedures:      Objective   Vitals:   09/18/20 0900 09/18/20 1000 09/18/20 1100 09/18/20 1200  BP: (!) 173/64 (!) 167/58 (!) 164/65 (!) 173/62  Pulse: (!) 106 (!) 106 (!) 106 (!) 106  Resp: 15 15 17 17   Temp:    99.5 F (37.5 C)  TempSrc:    Axillary  SpO2: 100% 100% 100% 100%  Weight:      Height:        Intake/Output Summary (Last 24 hours) at 09/18/2020 1321 Last data filed at 09/18/2020 0806 Gross per 24 hour  Intake 5072.85 ml  Output 200 ml  Net 4872.85 ml    03/01 1901 - 03/03 0700 In: 3792.5 [I.V.:592.5] Out: 200 [Urine:200]  Filed Weights   09/17/20 1856  Weight: 65.8 kg    Physical Examination:    General: Appears lethargic  Cardiovascular: S1-S2, regular, no murmur auscultated  Respiratory: Clear to auscultation bilaterally  Abdomen: Abdomen is soft, nontender, no organomegaly  Extremities: No edema in the lower extremities,  Neurologic: Alert, not oriented x3, withdraws extremities to pain, not following commands   Status is: Inpatient  Dispo: The patient is from: Home              Anticipated d/c is to: Skilled nursing facility              Anticipated d/c date is: 09/22/2020              Patient currently not stable for discharge  Barrier to discharge-ongoing treatment for metabolic encephalopathy      Data Reviewed:   Recent Results (from the past 240 hour(s))  Resp Panel by RT-PCR (Flu A&B, Covid) Nasopharyngeal Swab     Status: None   Collection Time: 09/17/20  8:45 PM   Specimen: Nasopharyngeal Swab; Nasopharyngeal(NP) swabs in vial transport medium  Result Value Ref Range  Status   SARS Coronavirus 2 by RT PCR NEGATIVE NEGATIVE Final    Comment: (NOTE) SARS-CoV-2 target nucleic acids are NOT DETECTED.  The SARS-CoV-2 RNA is generally detectable in upper respiratory specimens during the acute phase of infection. The lowest concentration of SARS-CoV-2 viral copies this assay can detect  is 138 copies/mL. A negative result does not preclude SARS-Cov-2 infection and should not be used as the sole basis for treatment or other patient management decisions. A negative result may occur with  improper specimen collection/handling, submission of specimen other than nasopharyngeal swab, presence of viral mutation(s) within the areas targeted by this assay, and inadequate number of viral copies(<138 copies/mL). A negative result must be combined with clinical observations, patient history, and epidemiological information. The expected result is Negative.  Fact Sheet for Patients:  BloggerCourse.com  Fact Sheet for Healthcare Providers:  SeriousBroker.it  This test is no t yet approved or cleared by the [place] FDA and  has been authorized for detection and/or diagnosis of SARS-CoV-2 by FDA under an Emergency Use Authorization (EUA). This EUA will remain  in effect (meaning this test can be used) for the duration of the COVID-19 declaration under Section 564(b)(1) of the Act, 21 U.S.C.section 360bbb-3(b)(1), unless the authorization is terminated  or revoked sooner.       Influenza A by PCR NEGATIVE NEGATIVE Final   Influenza B by PCR NEGATIVE NEGATIVE Final    Comment: (NOTE) The Xpert Xpress SARS-CoV-2/FLU/RSV plus assay is intended as an aid in the diagnosis of influenza from Nasopharyngeal swab specimens and should not be used as a sole basis for treatment. Nasal washings and aspirates are unacceptable for Xpert Xpress SARS-CoV-2/FLU/RSV testing.  Fact Sheet for  Patients: BloggerCourse.com  Fact Sheet for Healthcare Providers: SeriousBroker.it  This test is not yet approved or cleared by the [place] FDA and has been authorized for detection and/or diagnosis of SARS-CoV-2 by FDA under an Emergency Use Authorization (EUA). This EUA will remain in effect (meaning this test can be used) for the duration of the COVID-19 declaration under Section 564(b)(1) of the Act, 21 U.S.C. section 360bbb-3(b)(1), unless the authorization is terminated or revoked.  Performed at [facility], [address] [place], [place] [address]   MRSA PCR Screening     Status: None   Collection Time: 09/18/20 12:21 AM   Specimen: Nasopharyngeal  Result Value Ref Range Status   MRSA by PCR NEGATIVE NEGATIVE Final    Comment:        The GeneXpert MRSA Assay (FDA approved for NASAL specimens only), is one component of a comprehensive MRSA colonization surveillance program. It is not intended to diagnose MRSA infection nor to guide or monitor treatment for MRSA infections. Performed at [facility], [address], [place], [place] [address]     No results for input(s): LIPASE, AMYLASE in the last 168 hours. Recent Labs  Lab 09/18/20 0130  AMMONIA 11    Cardiac Enzymes: Recent Labs  Lab 09/17/20 1720 09/18/20 0130  CKTOTAL 529* 941*   BNP (last 3 results) No results for input(s): BNP in the last 8760 hours.  ProBNP (last 3 results) No results for input(s): PROBNP in the last 8760 hours.  Studies:  CT Head Wo Contrast  Result Date: 09/17/2020 CLINICAL DATA:  Found on floor altered EXAM: CT HEAD WITHOUT CONTRAST TECHNIQUE: Contiguous axial images were obtained from the base of the skull through the vertex without intravenous contrast. COMPARISON:  None. FINDINGS: Brain: No acute territorial infarction, hemorrhage, or intracranial mass. Mild atrophy. Minimal  white matter hypodensity likely chronic small vessel ischemic change. The ventricles are nonenlarged Vascular: No hyperdense vessels.  Carotid vascular calcification Skull: Normal. Negative for fracture or focal lesion. Sinuses/Orbits: Abnormal hyperdense right globe with scleral band. Other: None IMPRESSION:  1. No CT evidence for acute intracranial abnormality. 2. Atrophy and minimal small vessel ischemic changes of the white matter. 3. Abnormal hyperdense right globe, correlate with eye exam Electronically Signed   By: Jasmine PangKim  Fujinaga M.D.   On: 09/17/2020 23:40   CT Cervical Spine Wo Contrast  Result Date: 09/17/2020 CLINICAL DATA:  Found on floor EXAM: CT CERVICAL SPINE WITHOUT CONTRAST TECHNIQUE: Multidetector CT imaging of the cervical spine was performed without intravenous contrast. Multiplanar CT image reconstructions were also generated. COMPARISON:  None. FINDINGS: Alignment: Sagittal alignment shows trace anterolisthesis C3 on C4. Facet alignment is within normal limits. There is marked rotation of C1 on C2 presumably related to head positioning. Skull base and vertebrae: No acute fracture. No primary bone lesion or focal pathologic process. Soft tissues and spinal canal: No prevertebral fluid or swelling. No visible canal hematoma. Disc levels: Mild degenerative changes C4-C5 and C6-C7. Facet degenerative change at multiple levels Upper chest: Negative. Other: None IMPRESSION: No fracture identified. There is marked rotation of C1 on C2 which is presumably related to head positioning. Electronically Signed   By: Jasmine PangKim  Fujinaga M.D.   On: 09/17/2020 23:46   DG Chest Portable 1 View  Result Date: 09/17/2020 CLINICAL DATA:  Altered level of consciousness, found down EXAM: PORTABLE CHEST 1 VIEW COMPARISON:  None. FINDINGS: The heart size and mediastinal contours are within normal limits. Both lungs are clear. The visualized skeletal structures are unremarkable. IMPRESSION: No active disease.  Electronically Signed   By: Sharlet SalinaMichael  Brown M.D.   On: 09/17/2020 18:33   DG Foot 2 Views Right  Result Date: 09/17/2020 CLINICAL DATA:  Found down, hyperglycemia, pain and swelling right foot EXAM: RIGHT FOOT - 2 VIEW COMPARISON:  None. FINDINGS: Frontal and lateral views of the right foot are obtained. Postsurgical changes are seen from prior ankle and hindfoot fusion. There is diffuse osteoarthritis throughout the midfoot and forefoot. Hammertoe deformities are noted. There are no acute displaced fractures. Diffuse soft tissue edema. No destructive bony lesions or periosteal reaction to suggest osteomyelitis. IMPRESSION: 1. Postsurgical changes from ankle and hindfoot fusion. 2. Diffuse osteoarthritis.  No acute or destructive bony lesions. 3. Diffuse soft tissue swelling. Electronically Signed   By: Sharlet SalinaMichael  Brown M.D.   On: 09/17/2020 22:56       Abegail Kloeppel S Pasty Manninen   [facility] If 7PM-7AM, please contact night-coverage at [email or web address], Office  [phone]   09/18/2020, 1:21 PM  LOS: 0 days

## 2020-09-18 NOTE — Progress Notes (Signed)
Inpatient Diabetes Program Recommendations  AACE/ADA: New Consensus Statement on Inpatient Glycemic Control (2015)  Target Ranges:  Prepandial:   less than 140 mg/dL      Peak postprandial:   less than 180 mg/dL (1-2 hours)      Critically ill patients:  140 - 180 mg/dL   Lab Results  Component Value Date   GLUCAP 186 (H) 09/18/2020   HGBA1C 7.0 (H) 09/18/2020    Review of Glycemic Control  Diabetes history: DM1 Outpatient Diabetes medications: Lantus 8 units QHS, Humalog - ? dose Current orders for Inpatient glycemic control: IV insulin  HgbA1C - 7% DKA and AMS Continues to be disoriented. Has soft wrist restraints. Will see when appropriate. Hx multiple hypoglycemic episodes at home. Pt trying to lose weight and only eating shrimp and lettuce. Hx non-compliance with insulin. Blind in R eye.  AG - 11   CO2 16  Inpatient Diabetes Program Recommendations:     Continue IV insulin until acidosis resolved.  Give Lantus 2H prior to discontinuation of insulin drip when appropriate, along with Novolog 0-9 units Q4H. Will need meal coverage when eating.  Will follow.   Thank you. Ailene Ards, RD, LDN, CDE Inpatient Diabetes Coordinator [phone]

## 2020-09-18 NOTE — Progress Notes (Signed)
Date and time results received: 09/18/20 1012 (use smartphrase ".now" to insert current time)  Test: troponin Critical Value: 683  Name of Provider Notified: [place]  Orders Received? Or Actions Taken?: Actions Taken: continue to carefully monitor

## 2020-09-18 NOTE — Consult Note (Addendum)
Cardiology Consultation:   Patient ID: Donna Harrell MRN: [id number]; DOB: [birth year]  Admit date: 09/17/2020 Date of Consult: 09/18/2020  PCP:  Aviva Kluver   [facility]  Cardiologist:  New to [facility] Advanced Practice Provider:  No care team member to display Electrophysiologist:  None    Patient Profile:   Donna Harrell is a 65 y.o. female with a hx of DM 1, hypertension, depression, and hyperlipidemia who is being seen today for the evaluation of elevated troponin at the request of Dr. Sharl Ma.  History of Present Illness:   Donna Harrell is a 65 year old female with past medical history of DM 1, hypertension, depression, and hyperlipidemia.  She was found down on the floor in her home on 09/17/2020 after family requested a safety check.  At the time of EMS arrival, she was covered in feces and urine.  It is unclear how long the patient has been down as she is not coherent.  Apparently the patient moved to [place] on her own in December against the family's wishes.  She is noncompliant with her insulin and has a very poor diet.    On arrival to the hospital, glucose was 510.  Total CK 529.  High-sensitivity troponin 41.  White blood cell count 20.7.  Beta hydroxybutyrate acid > 8.  Alcohol level negative.  Urine drug screen negative.  EKG showed a sinus tachycardia with heart rate in the 120s, no significant ST-T wave changes.  Chest x-ray normal.  Urinalysis shows greater than 500 glucose, rare bacteria, elevated ketones but negative leukocyte.  Patient was aggressively hydrated and given insulin.  Overnight, high-sensitivity serial troponin trended up to 652.  Cardiology has been consulted for elevated troponin.  Note, CT of head and neck shows no fracture and no acute process in the head, there was a hyperdensity in the right eye, ophthalmology service consulted.  She is taking Plavix for some unknown reason.  Unfortunately patient cannot communicate and  I am unable to reach the sister either.  At this time, patient is aware that she is in the hospital however does not know which city or state she is in.  She denies any recent chest pain.  She cannot recall why she is on Plavix.   Past Medical History:  Diagnosis Date  . Diabetes (HCC)   . 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 Hypertension   . Legally blind    per sister  . TIA (transient ischemic attack)    per sister    Past Surgical History:  Procedure Laterality Date  . 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 Authorizing Provider  amLODipine (NORVASC) 10 MG tablet Take 10 mg by mouth daily. 09/10/20   [provider]  atorvastatin (LIPITOR) 40 MG tablet Take 40 mg by mouth daily. 06/13/20   [provider]  clobetasol cream (TEMOVATE) 0.05 % Apply 1 application topically every other day. 09/05/20   [provider]  clopidogrel (PLAVIX) 75 MG tablet Take 75 mg by mouth daily. 06/13/20   [provider]  hydrocortisone valerate cream (WESTCORT) 0.2 % Apply 1 application topically See admin instructions. APPLY DAILY TO FACE FOR 5 DAY, THEN EVERY OTHER DAY FOR A WEEK, THEN REPEAT 09/08/20  [provider]  insulin lispro (HUMALOG) 100 UNIT/ML KwikPen SMARTSIG:Unit(s) SUB-Q As Directed 09/11/20   [provider]  LANTUS SOLOSTAR 100 UNIT/ML Solostar Pen Inject 8 Units into the skin at bedtime. 09/05/20   [provider]  lisinopril (ZESTRIL) 20 MG tablet Take 20 mg by mouth daily. 05/30/20   [provider]    Inpatient Medications: Scheduled Meds: . chlorhexidine  15 mL Mouth Rinse BID  . Chlorhexidine Gluconate Cloth  6 each Topical Daily  . mouth rinse  15 mL Mouth Rinse q12n4p  . mouth rinse  15 mL Mouth Rinse  q12n4p   Continuous Infusions: . dextrose 5% lactated ringers 125 mL/hr at 09/17/20 2309  . dextrose 5% lactated ringers Stopped (09/18/20 0053)  . insulin 0.6 mL/hr at 09/18/20 0806  . lactated ringers Stopped (09/17/20 2305)  . lactated ringers Stopped (09/18/20 0139)  . thiamine injection     PRN Meds: dextrose  Allergies:   Not on File  Social History:   Social History   Socioeconomic History  . Marital status: Single    Spouse name: Not on file  . Number of children: Not on file  . Years of education: Not on file  . Highest education level: Not on file  Occupational History  . Not on file  Tobacco Use  . Smoking status: Former Games developermoker  . Smokeless tobacco: Never Used  . Tobacco comment: quit 20 years ago  Substance and Sexual Activity  . Alcohol use: Not Currently  . Drug use: Not Currently    Comment: used to use marijuana as a teenager  . Sexual activity: Not on file  Other Topics Concern  . Not on file  Social History Narrative  . Not on file   Social Determinants of Health   Financial Resource Strain: Not on file  Food Insecurity: Not on file  Transportation Needs: Not on file  Physical Activity: Not on file  Stress: Not on file  Social Connections: Not on file  Intimate Partner Violence: Not on file    Family History:   Family History  Problem Relation Age of Onset  . High blood pressure Mother   . High blood pressure Sister   . Stroke Sister      ROS:  Please see the history of present illness.   All other ROS reviewed and negative.     Physical Exam/Data:   Vitals:   09/18/20 0100 09/18/20 0400 09/18/20 0700 09/18/20 0800  BP: (!) 141/60  (!) 158/59 (!) 164/65  Pulse: (!) 113  (!) 101 (!) 101  Resp: 16  16 15   Temp:  99 F (37.2 C)  99.3 F (37.4 C)  TempSrc:  Axillary  Axillary  SpO2: 99%  100% 100%  Weight:      Height:        Intake/Output Summary (Last 24 hours) at 09/18/2020 1059 Last data filed at 09/18/2020 0806 Gross per  24 hour  Intake 5072.85 ml  Output 200 ml  Net 4872.85 ml   Last 3 Weights 09/17/2020  Weight (lbs) 145 lb  Weight (kg) 65.772 kg     Body mass index is 24.13 kg/m.  General: Confused, able to answer simple yes or no questions, cannot communicate very effectively or give any detail to the story HEENT: normal Lymph: no adenopathy Neck: no JVD Endocrine:  No thryomegaly Vascular: No carotid bruits; FA pulses 2+ bilaterally without bruits  Cardiac:  normal S1, S2; RRR; no murmur  Lungs:  clear to auscultation bilaterally, no wheezing, rhonchi or rales  Abd: soft, no hepatomegaly  Ext: no edema Musculoskeletal:  No deformities, BUE and BLE strength normal and equal Skin: warm and dry  Neuro:  CNs 2-12 intact, no focal abnormalities noted Psych:  Normal affect   EKG:  The EKG was personally reviewed and demonstrates: Sinus tachycardia, no significant ST-T wave changes Telemetry:  Telemetry was personally reviewed and demonstrates: Sinus tachycardia, initial heart rate in the 110s to 120s, overnight, heart rate came down to 100-110s.  Relevant CV Studies:  N/A  Laboratory Data:  High Sensitivity Troponin:   Recent Labs  Lab 09/17/20 1720 09/17/20 2356 09/18/20 0130 09/18/20 0757 09/18/20 1012  TROPONINIHS 41* 333* 383* 652* 683*     Chemistry Recent Labs  Lab 09/17/20 2045 09/18/20 0130 09/18/20 0757  NA 142 146* 147*  K 4.7 4.8 3.9  CL 111 116* 120*  CO2 11* 15* 20*  GLUCOSE 437* 139* 146*  BUN 25* 22 20  CREATININE 1.16* 0.86 0.82  CALCIUM 9.6 9.4 9.0  GFRNONAA 53* >60 >60  ANIONGAP 20* 15 7    Recent Labs  Lab 09/17/20 1720  PROT 7.6  ALBUMIN 4.3  AST 40  ALT 30  ALKPHOS 76  BILITOT 1.9*   Hematology Recent Labs  Lab 09/17/20 1720 09/17/20 1829  WBC 20.7*  --   RBC 4.97  --   HGB 12.4 14.3  HCT 41.8 42.0  MCV 84.1  --   MCH 24.9*  --   MCHC 29.7*  --   RDW 15.3  --   PLT 254  --    BNPNo results for input(s): BNP, PROBNP in the last  168 hours.  DDimer No results for input(s): DDIMER in the last 168 hours.   Radiology/Studies:  CT Head Wo Contrast  Result Date: 09/17/2020 CLINICAL DATA:  Found on floor altered EXAM: CT HEAD WITHOUT CONTRAST TECHNIQUE: Contiguous axial images were obtained from the base of the skull through the vertex without intravenous contrast. COMPARISON:  None. FINDINGS: Brain: No acute territorial infarction, hemorrhage, or intracranial mass. Mild atrophy. Minimal white matter hypodensity likely chronic small vessel ischemic change. The ventricles are nonenlarged Vascular: No hyperdense vessels.  Carotid vascular calcification Skull: Normal. Negative for fracture or focal lesion. Sinuses/Orbits: Abnormal hyperdense right globe with scleral band. Other: None IMPRESSION: 1. No CT evidence for acute intracranial abnormality. 2. Atrophy and minimal small vessel ischemic changes of the white matter. 3. Abnormal hyperdense right globe, correlate with eye exam Electronically Signed   By: Jasmine Pang M.D.   On: 09/17/2020 23:40   CT Cervical Spine Wo Contrast  Result Date: 09/17/2020 CLINICAL DATA:  Found on floor EXAM: CT CERVICAL SPINE WITHOUT CONTRAST TECHNIQUE: Multidetector CT imaging of the cervical spine was performed without intravenous contrast. Multiplanar CT image reconstructions were also generated. COMPARISON:  None. FINDINGS: Alignment: Sagittal alignment shows trace anterolisthesis C3 on C4. Facet alignment is within normal limits. There is marked rotation of C1 on C2 presumably related to head positioning. Skull base and vertebrae: No acute fracture. No primary bone lesion or focal pathologic process. Soft tissues and spinal canal: No prevertebral fluid or swelling. No visible canal hematoma. Disc levels: Mild degenerative changes C4-C5 and C6-C7. Facet degenerative change at multiple levels Upper chest: Negative. Other: None IMPRESSION: No fracture identified. There is marked rotation of C1 on C2 which  is presumably related to head positioning. Electronically Signed   By: Adrian Prows.D.  On: 09/17/2020 23:46   DG Chest Portable 1 View  Result Date: 09/17/2020 CLINICAL DATA:  Altered level of consciousness, found down EXAM: PORTABLE CHEST 1 VIEW COMPARISON:  None. FINDINGS: The heart size and mediastinal contours are within normal limits. Both lungs are clear. The visualized skeletal structures are unremarkable. IMPRESSION: No active disease. Electronically Signed   By: Sharlet Salina M.D.   On: 09/17/2020 18:33   DG Foot 2 Views Right  Result Date: 09/17/2020 CLINICAL DATA:  Found down, hyperglycemia, pain and swelling right foot EXAM: RIGHT FOOT - 2 VIEW COMPARISON:  None. FINDINGS: Frontal and lateral views of the right foot are obtained. Postsurgical changes are seen from prior ankle and hindfoot fusion. There is diffuse osteoarthritis throughout the midfoot and forefoot. Hammertoe deformities are noted. There are no acute displaced fractures. Diffuse soft tissue edema. No destructive bony lesions or periosteal reaction to suggest osteomyelitis. IMPRESSION: 1. Postsurgical changes from ankle and hindfoot fusion. 2. Diffuse osteoarthritis.  No acute or destructive bony lesions. 3. Diffuse soft tissue swelling. Electronically Signed   By: Sharlet Salina M.D.   On: 09/17/2020 22:56     Assessment and Plan:   1. Elevated troponin  -At this point, patient can barely communicate.  She did mention she has no chest pain, however her story is not reliable  -Heart rate in the 110s to 120s on arrival, now is down to 100-110s.  -Elevated troponin could be driven by the tachycardia, however will need more information from family and upcoming echocardiogram before further decision  -Overall, patient is a very poor candidate for any invasive intervention as she is not compliant.   2. Diabetic ketoacidosis: Managed by hospitalist service.  Getting insulin IV infusion and aggressive  hydration.  3. Hypertension: On amlodipine and lisinopril at home.  4. Hyperlipidemia: On Lipitor 40 mg daily at home  5. History of noncompliance: known history of noncompliance per family, which make management of her diabetes very difficult.  6. ?  Plavix usage for unknown reason: Attempted reaching out to her sisters this morning was unsuccessful, I will reach out to the sister again   Addendum: I was eventually able to reach the patient's sister Donna Harrell.  She is unaware of any previous cardiac issue but does mention she has possible mini strokes.  She received majority of her previous care in [place] prior to moving to [place].  Most recent record is likely resides with [facility]  7. Hyperdensity in the right eye: Seen on CT of the head.  Ophthalmology evaluation  8. Social situation: Patient was found down on the floor covered in urine and feces.  She apparently moved to [place] in December against family's wishes, she does not seems to have any local family support.   COVID status: negative on 3/2   Risk Assessment/Risk Scores:     TIMI Risk Score for Unstable Angina or Non-ST Elevation MI:   The patient's TIMI risk score is 2, which indicates a 8% risk of all cause mortality, new or recurrent myocardial infarction or need for urgent revascularization in the next 14 days.          For questions or updates, please contact [facility] Please consult [email or web address] for contact info under    Ramond Dial, [place]  09/18/2020 10:59 AM

## 2020-09-18 NOTE — Plan of Care (Signed)
  Problem: Safety: Goal: Non-violent Restraint(s) Outcome: Progressing   Problem: Education: Goal: Knowledge of General Education information will improve Description: Including pain rating scale, medication(s)/side effects and non-pharmacologic comfort measures Outcome: Progressing   Problem: Health Behavior/Discharge Planning: Goal: Ability to manage health-related needs will improve Outcome: Progressing   Problem: Clinical Measurements: Goal: Ability to maintain clinical measurements within normal limits will improve Outcome: Progressing Goal: Will remain free from infection Outcome: Progressing Goal: Diagnostic test results will improve Outcome: Progressing Goal: Respiratory complications will improve Outcome: Progressing Goal: Cardiovascular complication will be avoided Outcome: Progressing   Problem: Activity: Goal: Risk for activity intolerance will decrease Outcome: Progressing   Problem: Nutrition: Goal: Adequate nutrition will be maintained Outcome: Progressing   Problem: Coping: Goal: Level of anxiety will decrease Outcome: Progressing   Problem: Elimination: Goal: Will not experience complications related to bowel motility Outcome: Progressing Goal: Will not experience complications related to urinary retention Outcome: Progressing   Problem: Pain Managment: Goal: General experience of comfort will improve Outcome: Progressing   Problem: Safety: Goal: Ability to remain free from injury will improve Outcome: Progressing   Problem: Skin Integrity: Goal: Risk for impaired skin integrity will decrease Outcome: Progressing   Problem: Education: Goal: Ability to describe self-care measures that may prevent or decrease complications (Diabetes Survival Skills Education) will improve Outcome: Progressing Goal: Individualized Educational Video(s) Outcome: Progressing   Problem: Cardiac: Goal: Ability to maintain an adequate cardiac output will  improve Outcome: Progressing   Problem: Health Behavior/Discharge Planning: Goal: Ability to identify and utilize available resources and services will improve Outcome: Progressing Goal: Ability to manage health-related needs will improve Outcome: Progressing   Problem: Fluid Volume: Goal: Ability to achieve a balanced intake and output will improve Outcome: Progressing   Problem: Metabolic: Goal: Ability to maintain appropriate glucose levels will improve Outcome: Progressing   Problem: Nutritional: Goal: Maintenance of adequate nutrition will improve Outcome: Progressing Goal: Maintenance of adequate weight for body size and type will improve Outcome: Progressing   Problem: Respiratory: Goal: Will regain and/or maintain adequate ventilation Outcome: Progressing   Problem: Urinary Elimination: Goal: Ability to achieve and maintain adequate renal perfusion and functioning will improve Outcome: Progressing

## 2020-09-18 NOTE — Progress Notes (Signed)
  Echocardiogram 2D Echocardiogram has been performed.  Donna Harrell 09/18/2020, 12:26 PM

## 2020-09-18 NOTE — Progress Notes (Signed)
Spoke with RN. Unable to come down at 6pm. Will attempt at 9pm. Per RN, MRI may have to be attempted tomorrow.

## 2020-09-18 NOTE — Progress Notes (Signed)
Initial Nutrition Assessment  DOCUMENTATION CODES:   Not applicable  INTERVENTION:  - diet advancement as medically feasible.  NUTRITION DIAGNOSIS:   Inadequate oral intake related to inability to eat as evidenced by NPO status.  GOAL:   Patient will meet greater than or equal to 90% of their needs  MONITOR:   Diet advancement,Labs,Weight trends  REASON FOR ASSESSMENT:   Consult Assessment of nutrition requirement/status  ASSESSMENT:   65 year old female with medical history of type 1 DM, depression, and HTN. She presented to the ED with confusion. Her family reported that she lives alone. She was found down at the time of a wellness check and was found covered in urine and feces. Her family reported that she has been eating poorly and mainly consuming shrimp and lettuce. She moved to [place] in 06/2020. She was admitted for DKA and AMS. CT head was unremarkable.  She has been NPO since admission. Patient noted to be disoriented x4. No family/visitors present.   Patient laying in bed with soft wrist restraints and bilateral mittens. She awakes to name call x2. She denies abdominal pain, nausea, or abdominal pressure. She is unable to provide any other information but states "milk" multiple times.  Weight yesterday was 145 lb and appears to be a stated weight. No other weights recorded in the chart.   Per notes: - metabolic encephalopathy--s/p EEG and Neurologist reported it is suggestive of moderate diffuse encephalopathy without seizures or epileptiform - DKA--resolved - questionable NSTEMI - lactic acidosis    Labs reviewed; CBGs: 125-204 mg/dl since midnight, Na: 300 mmol/l, Cl: 120 mmol/l. Medications reviewed; 500 mg IV thiamine TID x3 days (3/3-3/5). IVF; D5-LR @ 125 ml/hr (510 kcal).    NUTRITION - FOCUSED PHYSICAL EXAM:  completed; no muscle or fat depletions, no edema noted at this time.   Diet Order:   Diet Order            Diet NPO time  specified  Diet effective now                 EDUCATION NEEDS:   Not appropriate for education at this time  Skin:  Skin Assessment: Reviewed RN Assessment  Last BM:  PTA/unknown  Height:   Ht Readings from Last 1 Encounters:  09/17/20 5\' 5"  (1.651 m)    Weight:   Wt Readings from Last 1 Encounters:  09/17/20 65.8 kg    Estimated Nutritional Needs:  Kcal:  1900-2100 kcal Protein:  85-100 grams Fluid:  >/= 2.2 L/day      11/17/20, MS, RD, LDN, CNSC Inpatient Clinical Dietitian RD pager # available in AMION  After hours/weekend pager # available in [facility]

## 2020-09-18 NOTE — Progress Notes (Signed)
Date and time results received: 09/18/20 0901 (use smartphrase ".now" to insert current time)  Test: Troponin  Critical Value: 652   Name of Provider Notified: [place]   Orders Received? Or Actions Taken?: repeat in 2 hours   RN will continue to carefully monitor

## 2020-09-18 NOTE — Progress Notes (Signed)
EEG completed, results pending. 

## 2020-09-18 NOTE — TOC Initial Note (Signed)
Transition of Care [facility] - Initial/Assessment Note    Patient Details  Name: Donna Harrell MRN: [id number] Date of Birth: 08/12/1955  Transition of Care [facility]) CM/SW Contact:    Golda Acre, RN Phone Number: 09/18/2020, 7:44 AM  Clinical Narrative:                  65 y.o. female with medical history significant of DM1, depression, hypertension    Presented with confusion.  Patient does not have regular follow-up.  She lives alone per family has not been consistent in taking her insulin. Was found down after family requested safety check.  On EMS arrival noted to be covered in feces and urine.  Unclear how long she has been in that state. On EMS blood sugar was 474 Patient originally not from [place] but moved here on her own in December against her family's wishes. Family stated that she has had very poor diet mainly eating lettuce and shrimp. 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 likely secondary to medical noncompliance resulting in frequent hypoglycemic episodes Family denies that she has history of alcohol or drugs or smoking      Initial COVID TEST  NEGATIVE  Plan to folloe for progression and toc needs May need diabetic coordinator to see Expected Discharge Plan: Home/Self Care Barriers to Discharge: Continued Medical Work up   Patient Goals and CMS Choice Patient states their goals for this hospitalization and ongoing recovery are:: to go back home      Expected Discharge Plan and Services Expected Discharge Plan: Home/Self Care   Discharge Planning Services: CM Consult   Living arrangements for the past 2 months: Apartment                                      Prior Living Arrangements/Services Living arrangements for the past 2 months: Apartment Lives with:: Self Patient language and need for interpreter reviewed:: Yes Do you feel safe going back to the place where you live?: Yes      Need for Family Participation in Patient Care: No  (Comment) Care giver support system in place?: No (comment)   Criminal Activity/Legal Involvement Pertinent to Current Situation/Hospitalization: No - Comment as needed  Activities of Daily Living      Permission Sought/Granted                  Emotional Assessment Appearance:: Appears stated age Attitude/Demeanor/Rapport: Engaged Affect (typically observed): Calm Orientation: : Oriented to Place,Oriented to Self,Oriented to  Time,Oriented to Situation Alcohol / Substance Use: Not Applicable Psych Involvement: No (comment)  Admission diagnosis:  DKA (diabetic ketoacidosis) (HCC) [E11.10] Pain and swelling of toe of right foot [id number], M79.89] Patient Active Problem List   Diagnosis Date Noted  . Essential hypertension 09/18/2020  . Acute metabolic encephalopathy 09/18/2020  . Elevated troponin 09/18/2020  . Elevated CK 09/18/2020  . Elevated lactic acid level 09/18/2020  . DKA (diabetic ketoacidosis) (HCC) 09/17/2020   PCP:  Oneita Hurt, No Pharmacy:   [id number] [place] - [id number] AT [facility] OF [facility] [address] [place] [address] Phone: [phone] Fax: [phone]     Social Determinants of Health (SDOH) Interventions    Readmission Risk Interventions No flowsheet data found.

## 2020-09-18 NOTE — Progress Notes (Signed)
Spoke with RN, patient still unable to travel to MRI at this time.

## 2020-09-18 NOTE — Progress Notes (Signed)
Date and time results received: 09/18/20 1624 (use smartphrase ".now" to insert current time)  Test: Troponin Critical Value: 759  Name of Provider Notified: [place]  Orders Received? Or Actions Taken?: Actions Taken: continue to carefully monitor, no escalation of care at this time

## 2020-09-19 ENCOUNTER — Other Ambulatory Visit: Payer: Self-pay

## 2020-09-19 DIAGNOSIS — R748 Abnormal levels of other serum enzymes: Secondary | ICD-10-CM | POA: Diagnosis not present

## 2020-09-19 DIAGNOSIS — E1010 Type 1 diabetes mellitus with ketoacidosis without coma: Secondary | ICD-10-CM | POA: Diagnosis not present

## 2020-09-19 DIAGNOSIS — R778 Other specified abnormalities of plasma proteins: Secondary | ICD-10-CM | POA: Diagnosis not present

## 2020-09-19 LAB — GLUCOSE, CAPILLARY
Glucose-Capillary: 105 mg/dL — ABNORMAL HIGH (ref 70–99)
Glucose-Capillary: 113 mg/dL — ABNORMAL HIGH (ref 70–99)
Glucose-Capillary: 116 mg/dL — ABNORMAL HIGH (ref 70–99)
Glucose-Capillary: 128 mg/dL — ABNORMAL HIGH (ref 70–99)
Glucose-Capillary: 129 mg/dL — ABNORMAL HIGH (ref 70–99)
Glucose-Capillary: 139 mg/dL — ABNORMAL HIGH (ref 70–99)
Glucose-Capillary: 143 mg/dL — ABNORMAL HIGH (ref 70–99)
Glucose-Capillary: 150 mg/dL — ABNORMAL HIGH (ref 70–99)
Glucose-Capillary: 157 mg/dL — ABNORMAL HIGH (ref 70–99)
Glucose-Capillary: 157 mg/dL — ABNORMAL HIGH (ref 70–99)
Glucose-Capillary: 161 mg/dL — ABNORMAL HIGH (ref 70–99)
Glucose-Capillary: 164 mg/dL — ABNORMAL HIGH (ref 70–99)
Glucose-Capillary: 166 mg/dL — ABNORMAL HIGH (ref 70–99)
Glucose-Capillary: 170 mg/dL — ABNORMAL HIGH (ref 70–99)
Glucose-Capillary: 173 mg/dL — ABNORMAL HIGH (ref 70–99)
Glucose-Capillary: 173 mg/dL — ABNORMAL HIGH (ref 70–99)
Glucose-Capillary: 184 mg/dL — ABNORMAL HIGH (ref 70–99)
Glucose-Capillary: 184 mg/dL — ABNORMAL HIGH (ref 70–99)
Glucose-Capillary: 186 mg/dL — ABNORMAL HIGH (ref 70–99)
Glucose-Capillary: 495 mg/dL — ABNORMAL HIGH (ref 70–99)
Glucose-Capillary: 99 mg/dL (ref 70–99)

## 2020-09-19 LAB — BASIC METABOLIC PANEL
Anion gap: 9 (ref 5–15)
Anion gap: 11 (ref 5–15)
BUN: 7 mg/dL — ABNORMAL LOW (ref 8–23)
BUN: 6 mg/dL — ABNORMAL LOW (ref 8–23)
CO2: 22 mmol/L (ref 22–32)
CO2: 20 mmol/L — ABNORMAL LOW (ref 22–32)
Calcium: 9.5 mg/dL (ref 8.9–10.3)
Calcium: 9.3 mg/dL (ref 8.9–10.3)
Chloride: 116 mmol/L — ABNORMAL HIGH (ref 98–111)
Chloride: 116 mmol/L — ABNORMAL HIGH (ref 98–111)
Creatinine, Ser: 0.61 mg/dL (ref 0.44–1.00)
Creatinine, Ser: 0.65 mg/dL (ref 0.44–1.00)
GFR, Estimated: >60 mL/min (ref >60)
GFR, Estimated: >60 mL/min (ref >60)
Glucose, Bld: 157 mg/dL — ABNORMAL HIGH (ref 70–99)
Glucose, Bld: 116 mg/dL — ABNORMAL HIGH (ref 70–99)
Potassium: 3.8 mmol/L (ref 3.5–5.1)
Potassium: 4 mmol/L (ref 3.5–5.1)
Sodium: 147 mmol/L — ABNORMAL HIGH (ref 135–145)
Sodium: 147 mmol/L — ABNORMAL HIGH (ref 135–145)

## 2020-09-19 LAB — COMPREHENSIVE METABOLIC PANEL
ALT: 32 U/L (ref 0–44)
AST: 70 U/L — ABNORMAL HIGH (ref 15–41)
Albumin: 3.4 g/dL — ABNORMAL LOW (ref 3.5–5.0)
Alkaline Phosphatase: 58 U/L (ref 38–126)
Anion gap: 11 (ref 5–15)
BUN: 10 mg/dL (ref 8–23)
CO2: 18 mmol/L — ABNORMAL LOW (ref 22–32)
Calcium: 9.6 mg/dL (ref 8.9–10.3)
Chloride: 119 mmol/L — ABNORMAL HIGH (ref 98–111)
Creatinine, Ser: 0.74 mg/dL (ref 0.44–1.00)
GFR, Estimated: >60 mL/min (ref >60)
Glucose, Bld: 133 mg/dL — ABNORMAL HIGH (ref 70–99)
Potassium: 5.4 mmol/L — ABNORMAL HIGH (ref 3.5–5.1)
Sodium: 148 mmol/L — ABNORMAL HIGH (ref 135–145)
Total Bilirubin: 0.9 mg/dL (ref 0.3–1.2)
Total Protein: 6.2 g/dL — ABNORMAL LOW (ref 6.5–8.1)

## 2020-09-19 LAB — URINE CULTURE

## 2020-09-19 LAB — CBC
HCT: 40.4 % (ref 36.0–46.0)
Hemoglobin: 12.4 g/dL (ref 12.0–15.0)
MCH: 25.1 pg — ABNORMAL LOW (ref 26.0–34.0)
MCHC: 30.7 g/dL (ref 30.0–36.0)
MCV: 81.6 fL (ref 80.0–100.0)
Platelets: 167 10*3/uL (ref 150–400)
RBC: 4.95 MIL/uL (ref 3.87–5.11)
RDW: 15.5 % (ref 11.5–15.5)
WBC: 14.8 10*3/uL — ABNORMAL HIGH (ref 4.0–10.5)
nRBC: 0 % (ref 0.0–0.2)

## 2020-09-19 LAB — CK: Total CK: 2089 U/L — ABNORMAL HIGH (ref 38–234)

## 2020-09-19 LAB — HEMOGLOBIN A1C
Hgb A1c MFr Bld: 7.1 % — ABNORMAL HIGH (ref 4.8–5.6)
Mean Plasma Glucose: 157.07 mg/dL

## 2020-09-19 LAB — TROPONIN I (HIGH SENSITIVITY): Troponin I (High Sensitivity): 313 ng/L (ref <18)

## 2020-09-19 LAB — BETA-HYDROXYBUTYRIC ACID
Beta-Hydroxybutyric Acid: 1.38 mmol/L — ABNORMAL HIGH (ref 0.05–0.27)
Beta-Hydroxybutyric Acid: 0.23 mmol/L (ref 0.05–0.27)

## 2020-09-19 LAB — POTASSIUM: Potassium: 3.7 mmol/L (ref 3.5–5.1)

## 2020-09-19 MED ORDER — HYDRALAZINE HCL 20 MG/ML IJ SOLN
10.0000 mg | INTRAMUSCULAR | Status: DC | PRN
  Administered 2020-09-19 – 2020-09-23 (×6): 10 mg via INTRAVENOUS
  Filled 2020-09-19 (×7): qty 1

## 2020-09-19 MED ORDER — INSULIN GLARGINE 100 UNIT/ML ~~LOC~~ SOLN
10.0000 [IU] | Freq: Every day | SUBCUTANEOUS | Status: DC
  Administered 2020-09-19 – 2020-09-29 (×11): 10 [IU] via SUBCUTANEOUS
  Filled 2020-09-19 (×12): qty 0.1

## 2020-09-19 MED ORDER — METOPROLOL TARTRATE 5 MG/5ML IV SOLN
5.0000 mg | Freq: Four times a day (QID) | INTRAVENOUS | Status: DC
  Administered 2020-09-19 – 2020-09-27 (×31): 5 mg via INTRAVENOUS
  Filled 2020-09-19 (×31): qty 5

## 2020-09-19 MED ORDER — LACTATED RINGERS IV BOLUS: 1000.0000 mL | Freq: Once | INTRAVENOUS | Status: DC

## 2020-09-19 MED ORDER — LORAZEPAM 2 MG/ML IJ SOLN
1.0000 mg | Freq: Once | INTRAMUSCULAR | Status: AC
  Administered 2020-09-19: 1 mg via INTRAVENOUS
  Filled 2020-09-19: qty 1

## 2020-09-19 MED ORDER — INSULIN ASPART 100 UNIT/ML ~~LOC~~ SOLN
0.0000 [IU] | SUBCUTANEOUS | Status: DC
  Administered 2020-09-19: 1 [IU] via SUBCUTANEOUS
  Administered 2020-09-19 – 2020-09-20 (×2): 2 [IU] via SUBCUTANEOUS
  Administered 2020-09-20: 7 [IU] via SUBCUTANEOUS
  Administered 2020-09-20: 3 [IU] via SUBCUTANEOUS
  Administered 2020-09-20 (×2): 2 [IU] via SUBCUTANEOUS
  Administered 2020-09-21: 1 [IU] via SUBCUTANEOUS
  Administered 2020-09-21: 2 [IU] via SUBCUTANEOUS
  Administered 2020-09-21: 1 [IU] via SUBCUTANEOUS
  Administered 2020-09-21 (×2): 3 [IU] via SUBCUTANEOUS
  Administered 2020-09-22: 1 [IU] via SUBCUTANEOUS

## 2020-09-19 MED ORDER — HALOPERIDOL LACTATE 5 MG/ML IJ SOLN
5.0000 mg | Freq: Once | INTRAMUSCULAR | Status: AC
  Administered 2020-09-19: 5 mg via INTRAVENOUS
  Filled 2020-09-19: qty 1

## 2020-09-19 NOTE — Progress Notes (Signed)
Pt has remained very restless/agitated throughout the night, attempting to get OOB and thrashing around in the bed. This RN received verbal orders from Dr. Sharl Ma to renew bilateral soft wrist restraints and give a one time dose of 1 mg ativan IVP for agitation. This RN will continue to carefully monitor pt.

## 2020-09-19 NOTE — Progress Notes (Signed)
Inpatient Diabetes Program Recommendations  AACE/ADA: New Consensus Statement on Inpatient Glycemic Control (2015)  Target Ranges:  Prepandial:   less than 140 mg/dL      Peak postprandial:   less than 180 mg/dL (1-2 hours)      Critically ill patients:  140 - 180 mg/dL   Lab Results  Component Value Date   GLUCAP 157 (H) 09/19/2020   HGBA1C 7.0 (H) 09/18/2020    Review of Glycemic Control Results for Donna Harrell, Donna Harrell (MRN [id number]) as of 09/19/2020 10:22  Ref. Range 09/19/2020 05:08 09/19/2020 06:08 09/19/2020 06:56 09/19/2020 09:05  Glucose-Capillary Latest Ref Range: 70 - 99 mg/dL 315 (H) 176 (H) 160 (H) 157 (H)    Diabetes history: DM1 Outpatient Diabetes medications: Lantus 8 units QHS, Humalog - ? dose Current orders for Inpatient glycemic control: IV insulin  HgbA1C - 7% DKA and AMS Continues to be disoriented. Has soft wrist restraints. Will see when appropriate. Hx multiple hypoglycemic episodes at home. Pt trying to lose weight and only eating shrimp and lettuce. Hx non-compliance with insulin. Blind in R eye.  AG - 11   CO2 16  Inpatient Diabetes Program Recommendations:     Continue IV insulin until acidosis resolved.  When ready to transition consider:  -Lantus 12 units two hours prior to discontinuation of insulin drip, then QD follow - along with Novolog 0-9 units Q4H. Will need meal coverage when eating.  Will follow.   Thanks, Lujean Rave, MSN, RNC-OB Diabetes Coordinator [phone] (8a-5p)

## 2020-09-19 NOTE — Progress Notes (Signed)
Patient's SBP maintaining in 180's. Dr. Sharl Ma notified by this RN; awaiting orders at this time. Q4 BMP results also sent to provider. RN will continue to carefully monitor pt.

## 2020-09-19 NOTE — Evaluation (Signed)
SLP Cancellation Note  Patient Details Name: Donna Harrell MRN: [id number] DOB: 1956-04-23   Cancelled treatment:       Reason Eval/Treat Not Completed: Other (comment);Fatigue/lethargy limiting ability to participate (She is very agitated and restless not following commands per RN, will reattempt later today.)  Rolena Infante, MS [facility] SLP Acute Rehab Services Office [phone] Pager [phone]   Chales Abrahams 09/19/2020, 12:47 PM

## 2020-09-19 NOTE — Progress Notes (Addendum)
[facility]  PROGRESS NOTE  Donna Harrell ZOX:[id number] DOB: [birth year] DOA: 09/17/2020 PCP: Pcp, No   Brief HPI:   65 year old female with medical history of diabetes mellitus type 1, depression, hypertension, was presented with confusion.  Patient lives alone per family and has not been consistent taking her insulin.  Also patient has been very insistent on losing diet and has been eating lettuce and shame.  She has had multiple episodes of hypoglycemia as per sister and many times EMS has been called for that.  She is also not compliant with taking insulin.  When EMS was called to home she was found covered in feces and urine.  As per patient's sister for past 2 days they were unable to contact her. In the ED patient was found to be in DKA also altered mental status.  She was started on IV insulin per DKA protocol.  CT head was unremarkable.    Subjective   Patient seen and examined, she is more alert, answering questions appropriately.  She knows her name, she knows she is in the hospital and was able to tell me the correct year.  MRI brain was unremarkable.   Assessment/Plan:     1. Metabolic encephalopathy-slowly improving, likely due to underlying DKA also having recurrent hypoglycemic episodes as per family.  As per patient's sister she was having multiple hypoglycemic episodes at home for which EMS was called repeatedly.  Patient was insistent on losing weight and has been only eating lettuce and shrimp 4 days.  Also not been compliant with insulin.  CT head was unremarkable, MRI and EEG were obtained.  MRI was unremarkable, EEG showed diffuse slowing, no epileptiform discharges noted.   I called and discussed with neurology, Dr. Amada Jupiter, patient started on  thiamine 500 mg IV every 8 hours for 3 days.  Thiamine level has already been drawn.  We will follow labs.  Ammonia level is 11, HIV nonreactive.   2. Diabetic ketoacidosis-resolved, patient presented with diabetic  ketoacidosis.  Started on IV insulin per DKA protocol.  We will continue with IV insulin, acidosis is slowly improving.  Will get swallow evaluation, before switching her on p.o. diet.  We will continue with IV insulin for now. 3. Elevated troponin/?  NSTEMI-patient's troponin went up to 683, EKG showed nonspecific ST changes.  Cardiology was consulted, no intervention recommended at this time. 4. Elevated CK-likely from being on the floor for 2 days.  Started on rehydration as above for DKA.  Follow CK level in a.m. 5. Lactic acidosis-patient has complained anion gap metabolic acidosis as well as non-anion gap metabolic acidosis.  Likely from dehydration.  Will follow lactic acid level after rehydration. 6. 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  Patient's right eyeball was found to be hyperdense on CT head.  I called and discussed with patient's sister who says that she had 2 surgeries on right eye but her vision could not be restored. 7. Parotid gland lesion-MRI shows 1.6 cm lesion within the superficial lobe of the left parotid gland, partially visualized.  Could be artifact versus primary neoplasm.  Dedicated CT soft tissue neck with contrast suggested when patient is more able to tolerate.      COVID-19 Labs  No results for input(s): DDIMER, FERRITIN, LDH, CRP in the last 72 hours.  Lab Results  Component Value Date   SARSCOV2NAA NEGATIVE 09/17/2020     Scheduled medications:   . chlorhexidine  15 mL Mouth Rinse BID  . Chlorhexidine  Gluconate Cloth  6 each Topical Daily  . mouth rinse  15 mL Mouth Rinse q12n4p  . mouth rinse  15 mL Mouth Rinse q12n4p  . metoprolol tartrate  5 mg Intravenous Q6H         CBG: Recent Labs  Lab 09/19/20 0508 09/19/20 0608 09/19/20 0656 09/19/20 0905 09/19/20 1057  GLUCAP 166* 157* 143* 157* 129*    SpO2: 100 %    CBC: Recent Labs  Lab 09/17/20 1720 09/17/20 1829 09/19/20 0217  WBC 20.7*  --  14.8*  NEUTROABS  17.9*  --   --   HGB 12.4 14.3 12.4  HCT 41.8 42.0 40.4  MCV 84.1  --  81.6  PLT 254  --  167    Basic Metabolic Panel: Recent Labs  Lab 09/18/20 0130 09/18/20 0757 09/18/20 1118 09/18/20 1624 09/19/20 0217 09/19/20 0439 09/19/20 0923  NA 146* 147* 146* 148* 148*  --  147*  K 4.8 3.9 4.8 3.8 5.4* 3.7 3.8  CL 116* 120* 119* 119* 119*  --  116*  CO2 15* 20* 16* 21* 18*  --  22  GLUCOSE 139* 146* 281* 186* 133*  --  157*  BUN 22 20 18 16 10   --  7*  CREATININE 0.86 0.82 0.81 0.79 0.74  --  0.61  CALCIUM 9.4 9.0 9.2 9.5 9.6  --  9.5  MG 2.5*  --   --   --   --   --   --   PHOS 2.9  --   --   --   --   --   --      Liver Function Tests: Recent Labs  Lab 09/17/20 1720 09/19/20 0217  AST 40 70*  ALT 30 32  ALKPHOS 76 58  BILITOT 1.9* 0.9  PROT 7.6 6.2*  ALBUMIN 4.3 3.4*     Antibiotics: Anti-infectives (From admission, onward)   None       DVT prophylaxis: SCDs  Code Status: Full code  Family Communication: Discussed with patient sister on phone   Consultants:    Procedures:      Objective   Vitals:   09/19/20 0700 09/19/20 0800 09/19/20 0900 09/19/20 1000  BP: (!) 181/69 (!) 167/77 (!) 164/136 129/61  Pulse: 80 70 84 64  Resp: 12 12 16 14   Temp:  98 F (36.7 C)    TempSrc:      SpO2: 100% 100% 100% 100%  Weight:      Height:        Intake/Output Summary (Last 24 hours) at 09/19/2020 1103 Last data filed at 09/19/2020 0944 Gross per 24 hour  Intake 3619.91 ml  Output 600 ml  Net 3019.91 ml    03/02 1901 - 03/04 0700 In: 7846.7 [I.V.:3218] Out: 800 [Urine:800]  Filed Weights   09/17/20 1856  Weight: 65.8 kg    Physical Examination:    General-appears in no acute distress  Heart-S1-S2, regular, no murmur auscultated  Lungs-clear to auscultation bilaterally, no wheezing or crackles auscultated  Abdomen-soft, nontender, no organomegaly  Extremities-no edema in the lower extremities  Neuro-alert, oriented x 3, moving  all extremities   Status is: Inpatient  Dispo: The patient is from: Home              Anticipated d/c is to: Skilled nursing facility              Anticipated d/c date is: 09/22/2020  Patient currently not stable for discharge  Barrier to discharge-ongoing treatment for metabolic encephalopathy      Data Reviewed:   Recent Results (from the past 240 hour(s))  Urine culture     Status: Abnormal   Collection Time: 09/17/20  7:32 PM   Specimen: Urine, Random  Result Value Ref Range Status   Specimen Description   Final    URINE, RANDOM Performed at [facility], [address], [place], [place] [address]    Special Requests   Final    NONE Performed at [facility], [address], [place], [place] [address]    Culture MULTIPLE SPECIES PRESENT, SUGGEST RECOLLECTION (A)  Final   Report Status 09/19/2020 FINAL  Final  Resp Panel by RT-PCR (Flu A&B, Covid) Nasopharyngeal Swab     Status: None   Collection Time: 09/17/20  8:45 PM   Specimen: Nasopharyngeal Swab; Nasopharyngeal(NP) swabs in vial transport medium  Result Value Ref Range Status   SARS Coronavirus 2 by RT PCR NEGATIVE NEGATIVE Final    Comment: (NOTE) SARS-CoV-2 target nucleic acids are NOT DETECTED.  The SARS-CoV-2 RNA is generally detectable in upper respiratory specimens during the acute phase of infection. The lowest concentration of SARS-CoV-2 viral copies this assay can detect is 138 copies/mL. A negative result does not preclude SARS-Cov-2 infection and should not be used as the sole basis for treatment or other patient management decisions. A negative result may occur with  improper specimen collection/handling, submission of specimen other than nasopharyngeal swab, presence of viral mutation(s) within the areas targeted by this assay, and inadequate number of viral copies(<138 copies/mL). A negative result must be combined with clinical observations,  patient history, and epidemiological information. The expected result is Negative.  Fact Sheet for Patients:  BloggerCourse.com  Fact Sheet for Healthcare Providers:  SeriousBroker.it  This test is no t yet approved or cleared by the [place] FDA and  has been authorized for detection and/or diagnosis of SARS-CoV-2 by FDA under an Emergency Use Authorization (EUA). This EUA will remain  in effect (meaning this test can be used) for the duration of the COVID-19 declaration under Section 564(b)(1) of the Act, 21 U.S.C.section 360bbb-3(b)(1), unless the authorization is terminated  or revoked sooner.       Influenza A by PCR NEGATIVE NEGATIVE Final   Influenza B by PCR NEGATIVE NEGATIVE Final    Comment: (NOTE) The Xpert Xpress SARS-CoV-2/FLU/RSV plus assay is intended as an aid in the diagnosis of influenza from Nasopharyngeal swab specimens and should not be used as a sole basis for treatment. Nasal washings and aspirates are unacceptable for Xpert Xpress SARS-CoV-2/FLU/RSV testing.  Fact Sheet for Patients: BloggerCourse.com  Fact Sheet for Healthcare Providers: SeriousBroker.it  This test is not yet approved or cleared by the [place] FDA and has been authorized for detection and/or diagnosis of SARS-CoV-2 by FDA under an Emergency Use Authorization (EUA). This EUA will remain in effect (meaning this test can be used) for the duration of the COVID-19 declaration under Section 564(b)(1) of the Act, 21 U.S.C. section 360bbb-3(b)(1), unless the authorization is terminated or revoked.  Performed at [facility], [address] [place], [place] [address]   MRSA PCR Screening     Status: None   Collection Time: 09/18/20 12:21 AM   Specimen: Nasopharyngeal  Result Value Ref Range Status   MRSA by PCR NEGATIVE NEGATIVE Final    Comment:  The  GeneXpert MRSA Assay (FDA approved for NASAL specimens only), is one component of a comprehensive MRSA colonization surveillance program. It is not intended to diagnose MRSA infection nor to guide or monitor treatment for MRSA infections. Performed at [facility], [address] [place], [place] [address]     No results for input(s): LIPASE, AMYLASE in the last 168 hours. Recent Labs  Lab 09/18/20 0130  AMMONIA 11    Cardiac Enzymes: Recent Labs  Lab 09/17/20 1720 09/18/20 0130  CKTOTAL 529* 941*   BNP (last 3 results) No results for input(s): BNP in the last 8760 hours.  ProBNP (last 3 results) No results for input(s): PROBNP in the last 8760 hours.  Studies:  CT Head Wo Contrast  Result Date: 09/17/2020 CLINICAL DATA:  Found on floor altered EXAM: CT HEAD WITHOUT CONTRAST TECHNIQUE: Contiguous axial images were obtained from the base of the skull through the vertex without intravenous contrast. COMPARISON:  None. FINDINGS: Brain: No acute territorial infarction, hemorrhage, or intracranial mass. Mild atrophy. Minimal white matter hypodensity likely chronic small vessel ischemic change. The ventricles are nonenlarged Vascular: No hyperdense vessels.  Carotid vascular calcification Skull: Normal. Negative for fracture or focal lesion. Sinuses/Orbits: Abnormal hyperdense right globe with scleral band. Other: None IMPRESSION: 1. No CT evidence for acute intracranial abnormality. 2. Atrophy and minimal small vessel ischemic changes of the white matter. 3. Abnormal hyperdense right globe, correlate with eye exam Electronically Signed   By: Jasmine Pang M.D.   On: 09/17/2020 23:40   CT Cervical Spine Wo Contrast  Result Date: 09/17/2020 CLINICAL DATA:  Found on floor EXAM: CT CERVICAL SPINE WITHOUT CONTRAST TECHNIQUE: Multidetector CT imaging of the cervical spine was performed without intravenous contrast. Multiplanar CT image reconstructions were also  generated. COMPARISON:  None. FINDINGS: Alignment: Sagittal alignment shows trace anterolisthesis C3 on C4. Facet alignment is within normal limits. There is marked rotation of C1 on C2 presumably related to head positioning. Skull base and vertebrae: No acute fracture. No primary bone lesion or focal pathologic process. Soft tissues and spinal canal: No prevertebral fluid or swelling. No visible canal hematoma. Disc levels: Mild degenerative changes C4-C5 and C6-C7. Facet degenerative change at multiple levels Upper chest: Negative. Other: None IMPRESSION: No fracture identified. There is marked rotation of C1 on C2 which is presumably related to head positioning. Electronically Signed   By: Jasmine Pang M.D.   On: 09/17/2020 23:46   MR BRAIN WO CONTRAST  Result Date: 09/18/2020 CLINICAL DATA:  Initial evaluation for acute mental status change. EXAM: MRI HEAD WITHOUT CONTRAST TECHNIQUE: Multiplanar, multiecho pulse sequences of the brain and surrounding structures were obtained without intravenous contrast. COMPARISON:  Prior head CT from 09/17/2020. FINDINGS: Brain: Examination technically limited as the patient was unable to tolerate the full length of the exam. Diffusion-weighted imaging, FLAIR, provided images are markedly degraded by motion artifact. Cerebral volume within normal limits for age. No abnormal foci of restricted diffusion to suggest acute or subacute ischemia. Gray-white matter differentiation maintained. No encephalomalacia to suggest chronic cortical infarction. No evidence for acute or chronic intracranial hemorrhage. No visible mass lesion, mass effect or midline shift. No hydrocephalus or extra-axial fluid collection. And gradient echo sequence only were performed. Additionally, Vascular: Major intracranial vascular flow voids are grossly maintained at the skull base. Skull and upper cervical spine: Craniocervical junction not well assessed on this limited examination. Bone marrow signal  intensity grossly within normal limits. No scalp soft tissue abnormality. Sinuses/Orbits:  Postsurgical changes present at the right globe. Globes orbital soft tissues demonstrate no acute finding. Paranasal sinuses are grossly clear. No mastoid effusion. Other: There is question of a 1.6 cm FLAIR hyperintense lesion within the superficial lobe of the left parotid gland (series 7, image 1), partially visualized. Finding not entirely certain on this limited examination. IMPRESSION: 1. Technically limited exam due to the patient's inability to tolerate the full length of the study and motion artifact. 2. Grossly negative brain MRI for age. No acute intracranial abnormality identified. 3. Question 1.6 cm lesion within the superficial lobe of the left parotid gland, partially visualized. While this finding may be artifactual in nature, a possible primary parotid neoplasm could also be considered. Further evaluation with dedicated CT soft tissue neck with contrast suggested for further evaluation when the patient is able to tolerate. Electronically Signed   By: Rise Mu M.D.   On: 09/18/2020 21:43   DG Chest Portable 1 View  Result Date: 09/17/2020 CLINICAL DATA:  Altered level of consciousness, found down EXAM: PORTABLE CHEST 1 VIEW COMPARISON:  None. FINDINGS: The heart size and mediastinal contours are within normal limits. Both lungs are clear. The visualized skeletal structures are unremarkable. IMPRESSION: No active disease. Electronically Signed   By: Sharlet Salina M.D.   On: 09/17/2020 18:33   DG Foot 2 Views Right  Result Date: 09/17/2020 CLINICAL DATA:  Found down, hyperglycemia, pain and swelling right foot EXAM: RIGHT FOOT - 2 VIEW COMPARISON:  None. FINDINGS: Frontal and lateral views of the right foot are obtained. Postsurgical changes are seen from prior ankle and hindfoot fusion. There is diffuse osteoarthritis throughout the midfoot and forefoot. Hammertoe deformities are noted. There  are no acute displaced fractures. Diffuse soft tissue edema. No destructive bony lesions or periosteal reaction to suggest osteomyelitis. IMPRESSION: 1. Postsurgical changes from ankle and hindfoot fusion. 2. Diffuse osteoarthritis.  No acute or destructive bony lesions. 3. Diffuse soft tissue swelling. Electronically Signed   By: Sharlet Salina M.D.   On: 09/17/2020 22:56   EEG adult  Result Date: 09/18/2020 Charlsie Quest, MD     09/18/2020  2:12 PM Patient Name: Donna Harrell MRN: [id number] Epilepsy Attending: Charlsie Quest Referring Physician/Provider: Dr Mauro Kaufmann Date: 09/18/2020 Duration: 24.21 mins Patient history: 65 year old female with altered mental status.  EEG evaluate for seizures. Level of alertness: Awake, asleep AEDs during EEG study: None Technical aspects: This EEG study was done with scalp electrodes positioned according to the 10-20 International system of electrode placement. Electrical activity was acquired at a sampling rate of 500Hz  and reviewed with a high frequency filter of 70Hz  and a low frequency filter of 1Hz . EEG data were recorded continuously and digitally stored. Description: No clear posterior dominant rhythm was seen.  Sleep was characterized by vertex waves, sleep spindles (12 to 14 Hz), maximal frontocentral region.  EEG showed continuous generalized 5 to 6 Hz theta slowing as well as intermittent generalized 2 to 3 Hz delta slowing. Hyperventilation and photic stimulation were not performed.   ABNORMALITY -Continuous slow, generalized IMPRESSION: This study is suggestive of moderate diffuse encephalopathy, nonspecific etiology. No seizures or epileptiform discharges were seen throughout the recording.   ECHOCARDIOGRAM COMPLETE  Result Date: 09/18/2020    ECHOCARDIOGRAM REPORT   Patient Name:   Donna Harrell Date of Exam: 09/18/2020 Medical Rec #:  11/18/2020       Height:       65.0 in Accession #:  [id number]      Weight:       145.0 lb Date of  Birth:  [birth year]      BSA:          1.725 m Patient Age:    64 years        BP:           164/65 mmHg Patient Gender: F               HR:           106 bpm. Exam Location:  Inpatient Procedure: 2D Echo, Cardiac Doppler and Color Doppler Indications:    Elevated Troponin.  History:        Patient has no prior history of Echocardiogram examinations.                 TIA; Risk Factors:Hypertension and Diabetes.  Sonographer:    Elmarie Shiley Dance Referring Phys: [id number] ANASTASSIA DOUTOVA IMPRESSIONS  1. Left ventricular ejection fraction, by estimation, is >75%. The left ventricle has hyperdynamic function. The left ventricle has no regional wall motion abnormalities. There is mild concentric left ventricular hypertrophy. Left ventricular diastolic parameters are consistent with Grade I diastolic dysfunction (impaired relaxation).  2. Right ventricular systolic function is normal. The right ventricular size is normal. There is normal pulmonary artery systolic pressure.  3. The mitral valve is normal in structure. Trivial mitral valve regurgitation.  4. The aortic valve is tricuspid. Aortic valve regurgitation is not visualized. No aortic stenosis is present.  5. The inferior vena cava is normal in size with greater than 50% respiratory variability, suggesting right atrial pressure of 3 mmHg. Comparison(s): No prior Echocardiogram. FINDINGS  Left Ventricle: Left ventricular ejection fraction, by estimation, is >75%. The left ventricle has hyperdynamic function. The left ventricle has no regional wall motion abnormalities. The left ventricular internal cavity size was normal in size. There is mild concentric left ventricular hypertrophy. Left ventricular diastolic parameters are consistent with Grade I diastolic dysfunction (impaired relaxation). Right Ventricle: The right ventricular size is normal. No increase in right ventricular wall thickness. Right ventricular systolic function is normal. There is normal pulmonary  artery systolic pressure. The tricuspid regurgitant velocity is 2.51 m/s, and  with an assumed right atrial pressure of 3 mmHg, the estimated right ventricular systolic pressure is 28.2 mmHg. Left Atrium: Left atrial size was normal in size. Right Atrium: Right atrial size was normal in size. Pericardium: There is no evidence of pericardial effusion. Mitral Valve: The mitral valve is normal in structure. There is mild thickening of the mitral valve leaflet(s). Trivial mitral valve regurgitation. Tricuspid Valve: The tricuspid valve is normal in structure. Tricuspid valve regurgitation is trivial. Aortic Valve: The aortic valve is tricuspid. Aortic valve regurgitation is not visualized. No aortic stenosis is present. Pulmonic Valve: The pulmonic valve was normal in structure. Pulmonic valve regurgitation is not visualized. Aorta: The aortic root and ascending aorta are structurally normal, with no evidence of dilitation. Venous: The inferior vena cava is normal in size with greater than 50% respiratory variability, suggesting right atrial pressure of 3 mmHg. IAS/Shunts: No atrial level shunt detected by color flow Doppler.  LEFT VENTRICLE PLAX 2D LVIDd:         3.70 cm  Diastology LVIDs:         1.70 cm  LV e' medial:    7.07 cm/s LV PW:         1.20 cm  LV E/e' medial:  15.3 LV  IVS:        1.00 cm  LV e' lateral:   11.20 cm/s LVOT diam:     1.90 cm  LV E/e' lateral: 9.6 LV SV:         81 LV SV Index:   47 LVOT Area:     2.84 cm  RIGHT VENTRICLE             IVC RV Basal diam:  2.50 cm     IVC diam: 1.80 cm RV S prime:     14.50 cm/s TAPSE (M-mode): 2.8 cm LEFT ATRIUM             Index       RIGHT ATRIUM           Index LA diam:        3.70 cm 2.14 cm/m  RA Area:     13.20 cm LA Vol (A2C):   35.6 ml 20.63 ml/m RA Volume:   30.60 ml  17.73 ml/m LA Vol (A4C):   32.1 ml 18.60 ml/m LA Biplane Vol: 34.1 ml 19.76 ml/m  AORTIC VALVE LVOT Vmax:   130.00 cm/s LVOT Vmean:  91.000 cm/s LVOT VTI:    0.284 m  AORTA Ao Root  diam: 2.60 cm Ao Asc diam:  2.70 cm MITRAL VALVE                TRICUSPID VALVE MV Area (PHT): 3.27 cm     TR Peak grad:   25.2 mmHg MV Decel Time: 232 msec     TR Vmax:        251.00 cm/s MV E velocity: 108.00 cm/s MV A velocity: 129.00 cm/s  SHUNTS MV E/A ratio:  0.84         Systemic VTI:  0.28 m                             Systemic Diam: 1.90 cm Laurance Flatten MD Electronically signed by Laurance Flatten MD Signature Date/Time: 09/18/2020/2:52:51 PM    Final        Meredeth Ide   [facility] If 7PM-7AM, please contact night-coverage at [email or web address], Office  [phone]   09/19/2020, 11:03 AM  LOS: 1 day

## 2020-09-19 NOTE — Evaluation (Signed)
SLP Cancellation Note  Patient Details Name: Donna Harrell MRN: [id number] DOB: Oct 18, 1955   Cancelled treatment:       Reason Eval/Treat Not Completed: Other (comment);Fatigue/lethargy limiting ability to participate   2nd attempt to see pt, pt now lethargic, did not awake to attempt po intake even when slid up in bed and with moderate verbal/tactile stim.  Will continue efforts.   Chales Abrahams 09/19/2020, 3:22 PM

## 2020-09-19 NOTE — Progress Notes (Signed)
Per [facility] instruction, insulin gtt and maintenance fluids were stopped by this RN d/t CBG being 105. Dr Sharl Ma paged by this RN to ask if he wants to transition pt to SQ insulin.   Dr Sharl Ma also notified of CK being elevated at 2089.   RN awaiting orders at this time and will continue to carefully monitor pt. Next CBG will be checked at 1300 per protocol.

## 2020-09-20 DIAGNOSIS — R778 Other specified abnormalities of plasma proteins: Secondary | ICD-10-CM | POA: Diagnosis not present

## 2020-09-20 DIAGNOSIS — G9341 Metabolic encephalopathy: Secondary | ICD-10-CM | POA: Diagnosis not present

## 2020-09-20 DIAGNOSIS — E1010 Type 1 diabetes mellitus with ketoacidosis without coma: Secondary | ICD-10-CM | POA: Diagnosis not present

## 2020-09-20 DIAGNOSIS — R748 Abnormal levels of other serum enzymes: Secondary | ICD-10-CM | POA: Diagnosis not present

## 2020-09-20 LAB — GLUCOSE, CAPILLARY
Glucose-Capillary: 159 mg/dL — ABNORMAL HIGH (ref 70–99)
Glucose-Capillary: 159 mg/dL — ABNORMAL HIGH (ref 70–99)
Glucose-Capillary: 193 mg/dL — ABNORMAL HIGH (ref 70–99)
Glucose-Capillary: 208 mg/dL — ABNORMAL HIGH (ref 70–99)
Glucose-Capillary: 209 mg/dL — ABNORMAL HIGH (ref 70–99)
Glucose-Capillary: 319 mg/dL — ABNORMAL HIGH (ref 70–99)

## 2020-09-20 LAB — BASIC METABOLIC PANEL
Anion gap: 11 (ref 5–15)
BUN: 5 mg/dL — ABNORMAL LOW (ref 8–23)
CO2: 24 mmol/L (ref 22–32)
Calcium: 9.3 mg/dL (ref 8.9–10.3)
Chloride: 105 mmol/L (ref 98–111)
Creatinine, Ser: 0.58 mg/dL (ref 0.44–1.00)
GFR, Estimated: >60 mL/min (ref >60)
Glucose, Bld: 166 mg/dL — ABNORMAL HIGH (ref 70–99)
Potassium: 3.1 mmol/L — ABNORMAL LOW (ref 3.5–5.1)
Sodium: 140 mmol/L (ref 135–145)

## 2020-09-20 LAB — VITAMIN B12: Vitamin B-12: 374 pg/mL (ref 180–914)

## 2020-09-20 LAB — TSH: TSH: 2.478 u[IU]/mL (ref 0.350–4.500)

## 2020-09-20 LAB — CK: Total CK: 2316 U/L — ABNORMAL HIGH (ref 38–234)

## 2020-09-20 MED ORDER — HYDROXYZINE HCL 50 MG/ML IM SOLN
25.0000 mg | Freq: Four times a day (QID) | INTRAMUSCULAR | Status: DC | PRN
Start: 2020-09-20 — End: 2020-09-24
  Administered 2020-09-20: 25 mg via INTRAMUSCULAR
  Filled 2020-09-20 (×4): qty 0.5

## 2020-09-20 MED ORDER — POTASSIUM CHLORIDE 10 MEQ/100ML IV SOLN
10.0000 meq | INTRAVENOUS | Status: AC
  Administered 2020-09-20 (×3): 10 meq via INTRAVENOUS
  Filled 2020-09-20 (×3): qty 100

## 2020-09-20 NOTE — Progress Notes (Signed)
[facility]  PROGRESS NOTE  Donna Harrell WUJ:[id number] DOB: 21-Oct-1955 DOA: 09/17/2020 PCP: Pcp, No   Brief HPI:   65 year old female with medical history of diabetes mellitus type 1, depression, hypertension, was presented with confusion.  Patient lives alone per family and has not been consistent taking her insulin.  Also patient has been very insistent on losing diet and has been eating lettuce and shame.  She has had multiple episodes of hypoglycemia as per sister and many times EMS has been called for that.  She is also not compliant with taking insulin.  When EMS was called to home she was found covered in feces and urine.  As per patient's sister for past 2 days they were unable to contact her.  In the ED patient was found to be in DKA also altered mental status.  She was started on IV insulin per DKA protocol.  CT head was unremarkable.    Subjective   Patient seen and examined, she is much more alert, communicative.  Says she is hungry.   Assessment/Plan:     1. Metabolic encephalopathy-resolved, likely multifactorial due to underlying DKA also having recurrent episodes of hypoglycemia at home.    As per patient's sister she was having multiple hypoglycemic episodes at home for which EMS was called repeatedly.  Patient was insistent on losing weight and has been only eating lettuce and shrimp 4 days.  Also not been compliant with insulin.  CT head was unremarkable, MRI and EEG were obtained.  MRI was unremarkable, EEG showed diffuse slowing, no epileptiform discharges noted.   I called and discussed with neurology, Dr. Amada JupiterKirkpatrick, patient started on  thiamine 500 mg IV every 8 hours for 3 days.  Thiamine level has already been drawn.  We will follow labs.  Ammonia level is 11, HIV nonreactive.   2. Diabetic ketoacidosis-resolved, patient presented with diabetic ketoacidosis.  Started on IV insulin per DKA protocol.  IV insulin was discontinued patient started on Lantus..   Will get swallow evaluation, before switching her on p.o. diet.  3. Hypokalemia-potassium was 3.1, will replace potassium with IV KCl 10 mg x 3.  Follow BMP in am. 4. Elevated troponin/?  NSTEMI-patient's troponin went up to 683, EKG showed nonspecific ST changes.  Cardiology was consulted, no intervention recommended at this time. 5. Elevated CK-likely from being on the floor for 2 days.  Started on rehydration as above for DKA.  CK is still elevated at 2300.  Continue IV fluids as above.  Will follow CK level in a.m.  6. Lactic acidosis-patient presented with both  anion gap metabolic acidosis as well as non-anion gap metabolic acidosis.  Likely from dehydration.  Resolved.  Bicarb is back to normal. 7. 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  Patient's right eyeball was found to be hyperdense on CT head.  I called and discussed with patient's sister who says that she had 2 surgeries on right eye but her vision could not be restored. 8. Parotid gland lesion-MRI shows 1.6 cm lesion within the superficial lobe of the left parotid gland, partially visualized.  Could be artifact versus primary neoplasm.  Dedicated CT soft tissue neck with contrast suggested when patient is more able to tolerate.      COVID-19 Labs  No results for input(s): DDIMER, FERRITIN, LDH, CRP in the last 72 hours.  Lab Results  Component Value Date   SARSCOV2NAA NEGATIVE 09/17/2020     Scheduled medications:   . chlorhexidine  15  mL Mouth Rinse BID  . Chlorhexidine Gluconate Cloth  6 each Topical Daily  . insulin aspart  0-9 Units Subcutaneous Q4H  . insulin glargine  10 Units Subcutaneous QHS  . mouth rinse  15 mL Mouth Rinse q12n4p  . metoprolol tartrate  5 mg Intravenous Q6H         CBG: Recent Labs  Lab 09/19/20 1911 09/19/20 2030 09/19/20 2258 09/20/20 0311 09/20/20 0740  GLUCAP 184* 186* 128* 159* 159*    SpO2: 100 %    CBC: Recent Labs  Lab 09/17/20 1720 09/17/20 1829  09/19/20 0217  WBC 20.7*  --  14.8*  NEUTROABS 17.9*  --   --   HGB 12.4 14.3 12.4  HCT 41.8 42.0 40.4  MCV 84.1  --  81.6  PLT 254  --  167    Basic Metabolic Panel: Recent Labs  Lab 09/18/20 0130 09/18/20 0757 09/18/20 1624 09/19/20 0217 09/19/20 0439 09/19/20 0923 09/19/20 1312 09/20/20 0311  NA 146*   < > 148* 148*  --  147* 147* 140  K 4.8   < > 3.8 5.4* 3.7 3.8 4.0 3.1*  CL 116*   < > 119* 119*  --  116* 116* 105  CO2 15*   < > 21* 18*  --  22 20* 24  GLUCOSE 139*   < > 186* 133*  --  157* 116* 166*  BUN 22   < > 16 10  --  7* 6* 5*  CREATININE 0.86   < > 0.79 0.74  --  0.61 0.65 0.58  CALCIUM 9.4   < > 9.5 9.6  --  9.5 9.3 9.3  MG 2.5*  --   --   --   --   --   --   --   PHOS 2.9  --   --   --   --   --   --   --    < > = values in this interval not displayed.     Liver Function Tests: Recent Labs  Lab 09/17/20 1720 09/19/20 0217  AST 40 70*  ALT 30 32  ALKPHOS 76 58  BILITOT 1.9* 0.9  PROT 7.6 6.2*  ALBUMIN 4.3 3.4*     Antibiotics: Anti-infectives (From admission, onward)   None       DVT prophylaxis: SCDs  Code Status: Full code  Family Communication: Discussed with patient sister on phone   Consultants:    Procedures:      Objective   Vitals:   09/20/20 0800 09/20/20 0804 09/20/20 0816 09/20/20 0915  BP: (!) 167/83  (!) 167/83 (!) 143/50  Pulse: 81   (!) 104  Resp: 15   (!) 24  Temp:  98.2 F (36.8 C)    TempSrc:  Oral    SpO2: 100%   100%  Weight:      Height:        Intake/Output Summary (Last 24 hours) at 09/20/2020 1011 Last data filed at 09/20/2020 0800 Gross per 24 hour  Intake 2039.99 ml  Output 2550 ml  Net -510.01 ml    03/03 1901 - 03/05 0700 In: 4406.8 [I.V.:4208.3] Out: 3150 [Urine:3150]  Filed Weights   09/17/20 1856  Weight: 65.8 kg    Physical Examination:    General-appears in no acute distress  Heart-S1-S2, regular, no murmur auscultated  Lungs-clear to auscultation bilaterally, no  wheezing or crackles auscultated  Abdomen-soft, nontender, no organomegaly  Extremities-no edema in the lower  extremities  Neuro-alert, oriented x3, no focal deficit noted   Status is: Inpatient  Dispo: The patient is from: Home              Anticipated d/c is to: Skilled nursing facility              Anticipated d/c date is: 09/22/2020              Patient currently not stable for discharge  Barrier to discharge-ongoing treatment for metabolic encephalopathy      Data Reviewed:   Recent Results (from the past 240 hour(s))  Urine culture     Status: Abnormal   Collection Time: 09/17/20  7:32 PM   Specimen: Urine, Random  Result Value Ref Range Status   Specimen Description   Final    URINE, RANDOM Performed at [facility], [address] [place], [place] [address]    Special Requests   Final    NONE Performed at [facility], [address], [place], [place] [address]    Culture MULTIPLE SPECIES PRESENT, SUGGEST RECOLLECTION (A)  Final   Report Status 09/19/2020 FINAL  Final  Resp Panel by RT-PCR (Flu A&B, Covid) Nasopharyngeal Swab     Status: None   Collection Time: 09/17/20  8:45 PM   Specimen: Nasopharyngeal Swab; Nasopharyngeal(NP) swabs in vial transport medium  Result Value Ref Range Status   SARS Coronavirus 2 by RT PCR NEGATIVE NEGATIVE Final    Comment: (NOTE) SARS-CoV-2 target nucleic acids are NOT DETECTED.  The SARS-CoV-2 RNA is generally detectable in upper respiratory specimens during the acute phase of infection. The lowest concentration of SARS-CoV-2 viral copies this assay can detect is 138 copies/mL. A negative result does not preclude SARS-Cov-2 infection and should not be used as the sole basis for treatment or other patient management decisions. A negative result may occur with  improper specimen collection/handling, submission of specimen other than nasopharyngeal swab, presence of viral mutation(s)  within the areas targeted by this assay, and inadequate number of viral copies(<138 copies/mL). A negative result must be combined with clinical observations, patient history, and epidemiological information. The expected result is Negative.  Fact Sheet for Patients:  BloggerCourse.com  Fact Sheet for Healthcare Providers:  SeriousBroker.it  This test is no t yet approved or cleared by the [place] FDA and  has been authorized for detection and/or diagnosis of SARS-CoV-2 by FDA under an Emergency Use Authorization (EUA). This EUA will remain  in effect (meaning this test can be used) for the duration of the COVID-19 declaration under Section 564(b)(1) of the Act, 21 U.S.C.section 360bbb-3(b)(1), unless the authorization is terminated  or revoked sooner.       Influenza A by PCR NEGATIVE NEGATIVE Final   Influenza B by PCR NEGATIVE NEGATIVE Final    Comment: (NOTE) The Xpert Xpress SARS-CoV-2/FLU/RSV plus assay is intended as an aid in the diagnosis of influenza from Nasopharyngeal swab specimens and should not be used as a sole basis for treatment. Nasal washings and aspirates are unacceptable for Xpert Xpress SARS-CoV-2/FLU/RSV testing.  Fact Sheet for Patients: BloggerCourse.com  Fact Sheet for Healthcare Providers: SeriousBroker.it  This test is not yet approved or cleared by the [place] FDA and has been authorized for detection and/or diagnosis of SARS-CoV-2 by FDA under an Emergency Use Authorization (EUA). This EUA will remain in effect (meaning this test can be used) for the duration of the COVID-19 declaration under Section 564(b)(1) of the Act,  21 U.S.C. section 360bbb-3(b)(1), unless the authorization is terminated or revoked.  Performed at [facility], [address] [place], [place] [address]   MRSA PCR Screening     Status: None    Collection Time: 09/18/20 12:21 AM   Specimen: Nasopharyngeal  Result Value Ref Range Status   MRSA by PCR NEGATIVE NEGATIVE Final    Comment:        The GeneXpert MRSA Assay (FDA approved for NASAL specimens only), is one component of a comprehensive MRSA colonization surveillance program. It is not intended to diagnose MRSA infection nor to guide or monitor treatment for MRSA infections. Performed at [facility], [address], [place], [place] [address]     No results for input(s): LIPASE, AMYLASE in the last 168 hours. Recent Labs  Lab 09/18/20 0130  AMMONIA 11    Cardiac Enzymes: Recent Labs  Lab 09/17/20 1720 09/18/20 0130 09/19/20 1123 09/20/20 0311  CKTOTAL 529* 941* 2,089* 2,316*   BNP (last 3 results) No results for input(s): BNP in the last 8760 hours.  ProBNP (last 3 results) No results for input(s): PROBNP in the last 8760 hours.  Studies:  MR BRAIN WO CONTRAST  Result Date: 09/18/2020 CLINICAL DATA:  Initial evaluation for acute mental status change. EXAM: MRI HEAD WITHOUT CONTRAST TECHNIQUE: Multiplanar, multiecho pulse sequences of the brain and surrounding structures were obtained without intravenous contrast. COMPARISON:  Prior head CT from 09/17/2020. FINDINGS: Brain: Examination technically limited as the patient was unable to tolerate the full length of the exam. Diffusion-weighted imaging, FLAIR, provided images are markedly degraded by motion artifact. Cerebral volume within normal limits for age. No abnormal foci of restricted diffusion to suggest acute or subacute ischemia. Gray-white matter differentiation maintained. No encephalomalacia to suggest chronic cortical infarction. No evidence for acute or chronic intracranial hemorrhage. No visible mass lesion, mass effect or midline shift. No hydrocephalus or extra-axial fluid collection. And gradient echo sequence only were performed. Additionally, Vascular: Major intracranial  vascular flow voids are grossly maintained at the skull base. Skull and upper cervical spine: Craniocervical junction not well assessed on this limited examination. Bone marrow signal intensity grossly within normal limits. No scalp soft tissue abnormality. Sinuses/Orbits: Postsurgical changes present at the right globe. Globes orbital soft tissues demonstrate no acute finding. Paranasal sinuses are grossly clear. No mastoid effusion. Other: There is question of a 1.6 cm FLAIR hyperintense lesion within the superficial lobe of the left parotid gland (series 7, image 1), partially visualized. Finding not entirely certain on this limited examination. IMPRESSION: 1. Technically limited exam due to the patient's inability to tolerate the full length of the study and motion artifact. 2. Grossly negative brain MRI for age. No acute intracranial abnormality identified. 3. Question 1.6 cm lesion within the superficial lobe of the left parotid gland, partially visualized. While this finding may be artifactual in nature, a possible primary parotid neoplasm could also be considered. Further evaluation with dedicated CT soft tissue neck with contrast suggested for further evaluation when the patient is able to tolerate. Electronically Signed   By: Rise Mu M.D.   On: 09/18/2020 21:43   EEG adult  Result Date: 09/18/2020 Charlsie Quest, MD     09/18/2020  2:12 PM Patient Name: Dayan Kreis MRN: [id number] Epilepsy Attending: Charlsie Quest Referring Physician/Provider: Dr Mauro Kaufmann Date: 09/18/2020 Duration: 24.21 mins Patient history: 65 year old female with altered mental status.  EEG evaluate for seizures. Level of alertness: Awake, asleep AEDs  during EEG study: None Technical aspects: This EEG study was done with scalp electrodes positioned according to the 10-20 International system of electrode placement. Electrical activity was acquired at a sampling rate of  and reviewed with a high frequency  filter of  and a low frequency filter of . EEG data were recorded continuously and digitally stored. Description: No clear posterior dominant rhythm was seen.  Sleep was characterized by vertex waves, sleep spindles (12 to 14 Hz), maximal frontocentral region.  EEG showed continuous generalized 5 to 6 Hz theta slowing as well as intermittent generalized 2 to 3 Hz delta slowing. Hyperventilation and photic stimulation were not performed.   ABNORMALITY -Continuous slow, generalized IMPRESSION: This study is suggestive of moderate diffuse encephalopathy, nonspecific etiology. No seizures or epileptiform discharges were seen throughout the recording. Charlsie Quest   ECHOCARDIOGRAM COMPLETE  Result Date: 09/18/2020    ECHOCARDIOGRAM REPORT   Patient Name:   MARTIA DALBY Date of Exam: 09/18/2020 Medical Rec #:  [id number]       Height:       65.0 in Accession #:    [id number]      Weight:       145.0 lb Date of Birth:  [birth year]      BSA:          1.725 m Patient Age:    64 years        BP:           164/65 mmHg Patient Gender: F               HR:           106 bpm. Exam Location:  Inpatient Procedure: 2D Echo, Cardiac Doppler and Color Doppler Indications:    Elevated Troponin.  History:        Patient has no prior history of Echocardiogram examinations.                 TIA; Risk Factors:Hypertension and Diabetes.  Sonographer:    Elmarie Shiley Dance Referring Phys: [id number] ANASTASSIA DOUTOVA IMPRESSIONS  1. Left ventricular ejection fraction, by estimation, is >75%. The left ventricle has hyperdynamic function. The left ventricle has no regional wall motion abnormalities. There is mild concentric left ventricular hypertrophy. Left ventricular diastolic parameters are consistent with Grade I diastolic dysfunction (impaired relaxation).  2. Right ventricular systolic function is normal. The right ventricular size is normal. There is normal pulmonary artery systolic pressure.  3. The mitral valve is normal in  structure. Trivial mitral valve regurgitation.  4. The aortic valve is tricuspid. Aortic valve regurgitation is not visualized. No aortic stenosis is present.  5. The inferior vena cava is normal in size with greater than 50% respiratory variability, suggesting right atrial pressure of 3 mmHg. Comparison(s): No prior Echocardiogram. FINDINGS  Left Ventricle: Left ventricular ejection fraction, by estimation, is >75%. The left ventricle has hyperdynamic function. The left ventricle has no regional wall motion abnormalities. The left ventricular internal cavity size was normal in size. There is mild concentric left ventricular hypertrophy. Left ventricular diastolic parameters are consistent with Grade I diastolic dysfunction (impaired relaxation). Right Ventricle: The right ventricular size is normal. No increase in right ventricular wall thickness. Right ventricular systolic function is normal. There is normal pulmonary artery systolic pressure. The tricuspid regurgitant velocity is 2.51 m/s, and  with an assumed right atrial pressure of 3 mmHg, the estimated right ventricular systolic pressure is 28.2 mmHg. Left Atrium: Left atrial size was normal in  size. Right Atrium: Right atrial size was normal in size. Pericardium: There is no evidence of pericardial effusion. Mitral Valve: The mitral valve is normal in structure. There is mild thickening of the mitral valve leaflet(s). Trivial mitral valve regurgitation. Tricuspid Valve: The tricuspid valve is normal in structure. Tricuspid valve regurgitation is trivial. Aortic Valve: The aortic valve is tricuspid. Aortic valve regurgitation is not visualized. No aortic stenosis is present. Pulmonic Valve: The pulmonic valve was normal in structure. Pulmonic valve regurgitation is not visualized. Aorta: The aortic root and ascending aorta are structurally normal, with no evidence of dilitation. Venous: The inferior vena cava is normal in size with greater than 50%  respiratory variability, suggesting right atrial pressure of 3 mmHg. IAS/Shunts: No atrial level shunt detected by color flow Doppler.  LEFT VENTRICLE PLAX 2D LVIDd:         3.70 cm  Diastology LVIDs:         1.70 cm  LV e' medial:    7.07 cm/s LV PW:         1.20 cm  LV E/e' medial:  15.3 LV IVS:        1.00 cm  LV e' lateral:   11.20 cm/s LVOT diam:     1.90 cm  LV E/e' lateral: 9.6 LV SV:         81 LV SV Index:   47 LVOT Area:     2.84 cm  RIGHT VENTRICLE             IVC RV Basal diam:  2.50 cm     IVC diam: 1.80 cm RV S prime:     14.50 cm/s TAPSE (M-mode): 2.8 cm LEFT ATRIUM             Index       RIGHT ATRIUM           Index LA diam:        3.70 cm 2.14 cm/m  RA Area:     13.20 cm LA Vol (A2C):   35.6 ml 20.63 ml/m RA Volume:   30.60 ml  17.73 ml/m LA Vol (A4C):   32.1 ml 18.60 ml/m LA Biplane Vol: 34.1 ml 19.76 ml/m  AORTIC VALVE LVOT Vmax:   130.00 cm/s LVOT Vmean:  91.000 cm/s LVOT VTI:    0.284 m  AORTA Ao Root diam: 2.60 cm Ao Asc diam:  2.70 cm MITRAL VALVE                TRICUSPID VALVE MV Area (PHT): 3.27 cm     TR Peak grad:   25.2 mmHg MV Decel Time: 232 msec     TR Vmax:        251.00 cm/s MV E velocity: 108.00 cm/s MV A velocity: 129.00 cm/s  SHUNTS MV E/A ratio:  0.84         Systemic VTI:  0.28 m                             Systemic Diam: 1.90 cm Laurance Flatten MD Electronically signed by Laurance Flatten MD Signature Date/Time: 09/18/2020/2:52:51 PM    Final        Meredeth Ide   [facility] If 7PM-7AM, please contact night-coverage at [email or web address], Office  [phone]   09/20/2020, 10:11 AM  LOS: 2 days

## 2020-09-20 NOTE — Consult Note (Addendum)
Neurology Consultation  Reason for Consult: encephalopathy Referring Physician: Drusilla Kanner, MD  CC: encepalopathy  History is obtained from: attending MD, chart, RN  HPI: Donna Harrell is a 65 y.o. female with a PMHx of IDDM I, HTN, HLD, TIA, and depression. Patient presented to [facility] ED by EMS after finding patient down on the floor after a wellness check. Patient had not been heard from in 2 days. When EMS arrived, patient was lying in feces and urine. It was unclear exactly how long she had been in this state. It was reported that patient had only been eating lettuce and shrimp for a few days and has a hx of multiple hypoglycemic episodes in the past where EMS were called. Family reported patient is a brittle diabetic and is non adherent with medications. Upon arrival, patient was in DKA with an elevated CK likely rhabdomyolysis due to being down for maybe 2 days.   Cardiology was consulted for elevated troponin which was attributed to demand ischemia from being down. Cardiology was able to reach some family who stated she was on Plavix for mini strokes and was followed in [place] before she moved here. Plavix was continued for now.   EEG was done suggestive of moderate diffuse encephalopathy, non specific. No seizures or epileptiform discharges were noted, so seizures were thought not to be the cause of this event.   Patient was treated for DKA which eventually resolved and sugars for past 2 days have been 159-184 with gap closed and normal CO2. Her CK is rising instead of falling, likely due to no drinking or eating and not being on IVF.   On 09/18/20, per attending note, she was more alert but not oriented. On 09/19/20, she was more alert and answering questions appropriately.   Attending contacted Dr. Amada Jupiter yesterday about encephalopathy and Thiamine high dose was started and we agreed to see the patient.   Today, NP woke up the patient and she was very sleepy and did not want to  participate much in exam. However, when she did become alert, Her AMS was improved over notes from yesterday. See below.    ROS: A 14 point ROS was performed and is negative except as noted in the HPI.    Past Medical History:  Diagnosis Date  . Diabetes (HCC)   . 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 Hypertension   . Legally blind    per sister  . TIA (transient ischemic attack)    per sister    Family History  Problem Relation Age of Onset  . High blood pressure Mother   . High blood pressure Sister   . Stroke Sister     Social History:   reports that she has quit smoking. She has never used smokeless tobacco. She reports previous alcohol use. She reports previous drug use.  Medications  Current Facility-Administered Medications:  .  0.9 %  sodium chloride infusion, , Intravenous, PRN, Meredeth Ide, MD, Stopped at 09/19/20 1047 .  acetaminophen (TYLENOL) suppository 650 mg, 650 mg, Rectal, Q6H PRN, Meredeth Ide, MD, 650 mg at 09/18/20 1253 .  chlorhexidine (PERIDEX) 0.12 % solution 15 mL, 15 mL, Mouth Rinse, BID, Doutova, Anastassia, MD, 15 mL at 09/20/20 0816 .  Chlorhexidine Gluconate Cloth 2 % PADS 6 each, 6 each, Topical, Daily, Doutova, Anastassia, MD, 6 each at 09/20/20 0953 .  dextrose 5 %  in lactated ringers infusion, , Intravenous, Continuous, Meredeth Ide, MD, Last Rate: 75 mL/hr at 09/20/20 0840, New Bag at 09/20/20 0840 .  dextrose 50 % solution 0-50 mL, 0-50 mL, Intravenous, PRN, Doutova, Anastassia, MD .  hydrALAZINE (APRESOLINE) injection 10 mg, 10 mg, Intravenous, Q4H PRN, Meredeth Ide, MD, 10 mg at 09/20/20 0816 .  insulin aspart (novoLOG) injection 0-9 Units, 0-9 Units, Subcutaneous, Q4H, Meredeth Ide, MD, 2 Units at 09/20/20 [phone] .  insulin glargine (LANTUS) injection 10 Units, 10 Units, Subcutaneous, QHS, Meredeth Ide, MD, 10 Units at 09/19/20 1838 .  lactated  ringers infusion, , Intravenous, Continuous, Therisa Doyne, MD, Stopped at 09/18/20 0139 .  MEDLINE mouth rinse, 15 mL, Mouth Rinse, q12n4p, Doutova, Anastassia, MD, 15 mL at 09/19/20 1625 .  metoprolol tartrate (LOPRESSOR) injection 5 mg, 5 mg, Intravenous, Q6H, Manuela Schwartz, NP, 5 mg at 09/20/20 7026 .  potassium chloride 10 mEq in 100 mL IVPB, 10 mEq, Intravenous, Q1 Hr x 3, Lama, Sarina Ill, MD, Last Rate: 100 mL/hr at 09/20/20 1034, 10 mEq at 09/20/20 1034 .  thiamine 500mg  in normal saline (82ml) IVPB, 500 mg, Intravenous, TID, 45m, Sharl Ma, MD, Stopped at 09/20/20 1023   Exam: Current vital signs: BP (!) 143/50   Pulse (!) 104   Temp 98.2 F (36.8 C) (Oral)   Resp (!) 24   Ht 5\' 5"  (1.651 m)   Wt 65.8 kg   SpO2 100%   BMI 24.13 kg/m  Vital signs in last 24 hours: Temp:  [97.5 F (36.4 C)-98.3 F (36.8 C)] 98.2 F (36.8 C) (03/05 0804) Pulse Rate:  [58-104] 104 (03/05 0915) Resp:  [11-24] 24 (03/05 0915) BP: (137-195)/(50-123) 143/50 (03/05 0915) SpO2:  [98 %-100 %] 100 % (03/05 0915)  GENERAL: Awake, alert in NAD HEENT: - Normocephalic and atraumatic LUNGS - Normal respiratory effort.  CV - RRR on tele ABDOMEN - Soft, nontender Ext: warm, well perfused Psych: Dull affect, smiling some.   NEURO: She will participate in some of exam and not others.  Mental Status: awake and later alert. Oriented to self, hospital, and year. Not oriented to city, state, day, month or date. She gave NP a thumbs up.  Speech/Language: speech is without dysarthria or aphasia. She named mask and pen and said a pen is for writing. Repeated a sentence. + bradyphrenia.  Cranial Nerves:  II: PERRL 30mm/brisk. visual fields full-can not get her to participate.  III, IV, VI: EOMI. Lid elevation symmetric and full.  V: sensation is intact and symmetrical to face.   VII: Smile is symmetrical. Able to raise eyebrows.  VIII:hearing intact to voice IX, X: will not open her mouth. Phonation  normal.  XI, XII: will not participate.    Motor: 5/5 to what she will let me test. Will wiggle toes on command.  Tone is normal. Bulk is normal.  Sensation- Intact to light touch bilaterally in all four extremities. Extinction absent to light touch to DSS.  Coordination: Holds arms up. No pronator drift.  Gait- deferred  Labs I have reviewed labs in epic and the results pertinent to this consultation are: Ammonia 11. CK 941 up to 2316. HIV neg, K 3.1. Gap 11.   CBC    Component Value Date/Time   WBC 14.8 (H) 09/19/2020 0217   RBC 4.95 09/19/2020 0217   HGB 12.4 09/19/2020 0217   HCT 40.4 09/19/2020 0217   PLT 167 09/19/2020 0217   MCV 81.6 09/19/2020 0217  MCH 25.1 (L) 09/19/2020 0217   MCHC 30.7 09/19/2020 0217   RDW 15.5 09/19/2020 0217   LYMPHSABS 1.3 09/17/2020 1720   MONOABS 1.4 (H) 09/17/2020 1720   EOSABS 0.0 09/17/2020 1720   BASOSABS 0.0 09/17/2020 1720    CMP     Component Value Date/Time   NA 140 09/20/2020 0311   K 3.1 (L) 09/20/2020 0311   CL 105 09/20/2020 0311   CO2 24 09/20/2020 0311   GLUCOSE 166 (H) 09/20/2020 0311   BUN 5 (L) 09/20/2020 0311   CREATININE 0.58 09/20/2020 0311   CALCIUM 9.3 09/20/2020 0311   PROT 6.2 (L) 09/19/2020 0217   ALBUMIN 3.4 (L) 09/19/2020 0217   AST 70 (H) 09/19/2020 0217   ALT 32 09/19/2020 0217   ALKPHOS 58 09/19/2020 0217   BILITOT 0.9 09/19/2020 0217   GFRNONAA >60 09/20/2020 [id number]    Imaging MD reviewed the images obtained  CT head 1. No CT evidence for acute intracranial abnormality. 2. Atrophy and minimal small vessel ischemic changes of the white matter. 3. Abnormal hyperdense right globe, correlate with eye exam  MRI brain 1. Technically limited exam due to the patient's inability to tolerate the full length of the study and motion artifact. 2. Grossly negative brain MRI for age. No acute intracranial abnormality identified. 3. Question 1.6 cm lesion within the superficial lobe of the left parotid  gland, partially visualized. While this finding may be artifactual in nature, a possible primary parotid neoplasm could also be considered. Further evaluation with dedicated CT soft tissue neck with contrast suggested for further evaluation when the patient is able to tolerate.   09/18/20 EEG This study is suggestive of moderate diffuse encephalopathy, nonspecific etiology. No seizures or epileptiform discharges were seen throughout the recording.  Assessment: 65 yo female who presented after being found down for ? 2 days. In DKA on arrival and family stated patient was non adherent to meds and diet. She had a lot of hypoglycemic events at home and EMS responded a lot. When she first came in, she was very confused and agitated. Her mental status has improved slowly over days, but she is still waxing and waning. We were asked to consult because of this. Her EEG showed encephalopathy without seizure activity and CT showed atrophy and small vessel ischemic changes likely from uncontrolled HTN and DM. The differentials for her noted mental status are being found down, rhabdo, DKA, multiple hypoglycemic episodes at home, poor diet with limited intake. She is on high dose Thiamine. Her gradual improvement is encouraging, but her encephalopathy may take days to weeks to resolve.   Spoke with attending at bedside who states today's exam is much improved.  Impression: 1. Toxic metabolic encephalopathy  Recommendations: -IVF for elevated CK -Continue high dose Thiamine. -check TSH, Vitamin B12 level-ordered. Treat as necessary -continue to correct metabolic derangements as you are doing.  -tight glucose control and BP control.  -DM education -hold Benzos and Haldol unless absolutely necessary or tighten parameters for nursing administration -delirium precautions  Pt seen by Jimmye NormanKaren Kirby-Graham, NP/Neuro and later by MD. Note/plan to be edited by MD as needed.  Pager: [phone]  I have seen the  patient and reviewed the above note.  Her encephalopathy is improving.  On my exam, she actually has a exotropia, and given that she is improving with thiamine, I think that Wernicke's is a consideration.  The other differential is really a multifactorial delirium due to her medical condition, which given that  she is improving, I would favor continuing supportive care.  I do not think there is any further neurodiagnostic testing which is likely to be helpful, but I would favor continuing supportive care and would continue thiamine 100 mg daily after her high-dose thiamine is complete.  Also would consider starting multivitamin.  At this time, with no further neurodiagnostic testing, neurology will be available on an as-needed basis only.  Please call with further questions or concerns.  Ritta Slot, MD [facility] [phone]  If 7pm- 7am, please page neurology on call as listed in [facility]ION.

## 2020-09-20 NOTE — Evaluation (Signed)
Clinical/Bedside Swallow Evaluation Patient Details  Name: Donna Harrell MRN: [id number] Date of Birth: July 17, 1956  Today's Date: 09/20/2020 Time: SLP Start Time (ACUTE ONLY): 1322 SLP Stop Time (ACUTE ONLY): 1338 SLP Time Calculation (min) (ACUTE ONLY): 16 min  Past Medical History:  Past Medical History:  Diagnosis Date  . Diabetes (HCC)   . 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 Hypertension   . Legally blind    per sister  . TIA (transient ischemic attack)    per sister   Past Surgical History:  Past Surgical History:  Procedure Laterality Date  . 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 Per RD note. "Her family reported that she lives alone. She was found down at the time of a wellness check and was found covered in urine and feces. Her family reported that she has been eating poorly and mainly consuming shrimp and lettuce. She moved to [place] in 06/2020. She was admitted for DKA and AMS. CT head was unremarkable".  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  CXR negative.   Assessment / Plan / Recommendation Clinical Impression  Pt was seen for a bedside swallow evaluation.  She was encountered asleep in bed and she roused to moderate verbal and tactile stimulation, but she remained lethargic throughout this session.  When asked, pt reported that she would like to participate in this evaluation.  She was seen with trials of an ice chip, thin liquid via straw, and a 1/2 tsp of puree.  Pt had decreased awareness of ice chip bolus and allowed it to spill anteriorly out of her oral cavity; however, when she was presented with water via straw sip, she independently drew liquid from the straw and consumed approximately 2-3 oz without overt s/sx of aspiration.  Pt demonstrated suspected prolonged AP  transport of the puree trial, and a delayed cough was observed >1 minute following this trial when [facility] was lowered.  Pt was unable to consume additional trials on this date secondary to fatigue.  Suspect that her tolerance of PO will greatly increase as LOA continues to improve.  Recommend initiation of a clear liquid (thin liquid) diet with medications administered via alternative means at this time.  SLP will f/u for PO trials and to monitor diet tolerance as LOA improves.  SLP Visit Diagnosis: Dysphagia, unspecified (R13.10)    Aspiration Risk  Mild aspiration risk    Diet Recommendation Thin liquid   Liquid Administration via: Straw Medication Administration: Via alternative means Supervision: Staff to assist with self feeding;Full supervision/cueing for compensatory strategies Compensations: Minimize environmental distractions;Slow rate;Small sips/bites    Other  Recommendations Oral Care Recommendations: Staff/trained caregiver to provide oral care;Oral care BID   Follow up Recommendations Other (comment) (TBD)      Frequency and Duration min 2x/week  2 weeks       Prognosis Prognosis for Safe Diet Advancement: Good      Swallow Study   General Date of Onset: 09/19/20 HPI: 65 yo female adm to [facility] with h/o 1 DM, depression, and HTN. Per RD note. "Her family reported that she lives alone. She was found down at the time of a wellness check and was found covered in urine and feces. Her family reported that she has  been eating poorly and mainly consuming shrimp and lettuce. She moved to [place] in 06/2020. She was admitted for DKA and AMS. CT head was unremarkable".  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  CXR negative. Type of Study: Bedside Swallow Evaluation Previous Swallow Assessment: None Diet Prior to this Study: NPO Temperature Spikes Noted: No Respiratory Status: Room air History of Recent Intubation: No Behavior/Cognition:  Lethargic/Drowsy;Requires cueing Oral Cavity Assessment: Within Functional Limits Oral Care Completed by SLP: No Oral Cavity - Dentition: Other (Comment) (difficult to assess, suspect adequate natural dentition) Patient Positioning: Upright in bed Baseline Vocal Quality: Normal Volitional Cough: Strong Volitional Swallow: Unable to elicit    Oral/Motor/Sensory Function Overall Oral Motor/Sensory Function: Other (comment) (Unable to assess secondary to lethargy)   Ice Chips Ice chips: Impaired Presentation: Spoon Oral Phase Impairments: Poor awareness of bolus Oral Phase Functional Implications: Left anterior spillage Other Comments: bolus spilled anteriorly beyond bilabial borders   Thin Liquid Thin Liquid: Within functional limits Presentation: Straw    Nectar Thick Nectar Thick Liquid: Not tested   Honey Thick Honey Thick Liquid: Not tested   Puree Puree: Impaired Presentation: Spoon Oral Phase Impairments: Poor awareness of bolus Oral Phase Functional Implications: Prolonged oral transit Pharyngeal Phase Impairments: Cough - Delayed   Solid     Solid: Not tested     Villa Herb M.S., CCC-SLP Acute Rehabilitation Services Office: [phone]  Shanon Rosser Aden Sek 09/20/2020,1:48 PM

## 2020-09-21 DIAGNOSIS — E1010 Type 1 diabetes mellitus with ketoacidosis without coma: Secondary | ICD-10-CM | POA: Diagnosis not present

## 2020-09-21 DIAGNOSIS — R778 Other specified abnormalities of plasma proteins: Secondary | ICD-10-CM | POA: Diagnosis not present

## 2020-09-21 DIAGNOSIS — R748 Abnormal levels of other serum enzymes: Secondary | ICD-10-CM | POA: Diagnosis not present

## 2020-09-21 LAB — GLUCOSE, CAPILLARY
Glucose-Capillary: 156 mg/dL — ABNORMAL HIGH (ref 70–99)
Glucose-Capillary: 131 mg/dL — ABNORMAL HIGH (ref 70–99)
Glucose-Capillary: 85 mg/dL (ref 70–99)
Glucose-Capillary: 139 mg/dL — ABNORMAL HIGH (ref 70–99)
Glucose-Capillary: 216 mg/dL — ABNORMAL HIGH (ref 70–99)

## 2020-09-21 LAB — BASIC METABOLIC PANEL
Anion gap: 10 (ref 5–15)
BUN: 9 mg/dL (ref 8–23)
CO2: 22 mmol/L (ref 22–32)
Calcium: 9.1 mg/dL (ref 8.9–10.3)
Chloride: 108 mmol/L (ref 98–111)
Creatinine, Ser: 0.53 mg/dL (ref 0.44–1.00)
GFR, Estimated: >60 mL/min (ref >60)
Glucose, Bld: 136 mg/dL — ABNORMAL HIGH (ref 70–99)
Potassium: 3.9 mmol/L (ref 3.5–5.1)
Sodium: 140 mmol/L (ref 135–145)

## 2020-09-21 LAB — CK: Total CK: 1631 U/L — ABNORMAL HIGH (ref 38–234)

## 2020-09-21 MED ORDER — LORAZEPAM 2 MG/ML IJ SOLN
1.0000 mg | Freq: Once | INTRAMUSCULAR | Status: AC
  Administered 2020-09-21: 1 mg via INTRAVENOUS
  Filled 2020-09-21: qty 1

## 2020-09-21 MED ORDER — HALOPERIDOL LACTATE 5 MG/ML IJ SOLN
2.0000 mg | Freq: Once | INTRAMUSCULAR | Status: AC | PRN
  Administered 2020-09-21: 2 mg via INTRAVENOUS
  Filled 2020-09-21: qty 1

## 2020-09-21 MED ORDER — LORAZEPAM 2 MG/ML IJ SOLN
0.5000 mg | Freq: Once | INTRAMUSCULAR | Status: AC
  Administered 2020-09-21: 0.5 mg via INTRAVENOUS
  Filled 2020-09-21: qty 1
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
## 2020-09-22 ENCOUNTER — Inpatient Hospital Stay (HOSPITAL_COMMUNITY): Payer: Medicare Other

## 2020-09-22 DIAGNOSIS — R630 Anorexia: Secondary | ICD-10-CM

## 2020-09-22 DIAGNOSIS — R609 Edema, unspecified: Secondary | ICD-10-CM | POA: Diagnosis not present

## 2020-09-22 DIAGNOSIS — M7989 Other specified soft tissue disorders: Secondary | ICD-10-CM | POA: Diagnosis not present

## 2020-09-22 DIAGNOSIS — E1010 Type 1 diabetes mellitus with ketoacidosis without coma: Secondary | ICD-10-CM | POA: Diagnosis not present

## 2020-09-22 DIAGNOSIS — R748 Abnormal levels of other serum enzymes: Secondary | ICD-10-CM | POA: Diagnosis not present

## 2020-09-22 DIAGNOSIS — R778 Other specified abnormalities of plasma proteins: Secondary | ICD-10-CM | POA: Diagnosis not present

## 2020-09-22 LAB — COMPREHENSIVE METABOLIC PANEL
ALT: 36 U/L (ref 0–44)
AST: 55 U/L — ABNORMAL HIGH (ref 15–41)
Albumin: 3.3 g/dL — ABNORMAL LOW (ref 3.5–5.0)
Alkaline Phosphatase: 64 U/L (ref 38–126)
Anion gap: 6 (ref 5–15)
BUN: 7 mg/dL — ABNORMAL LOW (ref 8–23)
CO2: 28 mmol/L (ref 22–32)
Calcium: 9.2 mg/dL (ref 8.9–10.3)
Chloride: 103 mmol/L (ref 98–111)
Creatinine, Ser: 0.57 mg/dL (ref 0.44–1.00)
GFR, Estimated: >60 mL/min (ref >60)
Glucose, Bld: 188 mg/dL — ABNORMAL HIGH (ref 70–99)
Potassium: 3.4 mmol/L — ABNORMAL LOW (ref 3.5–5.1)
Sodium: 137 mmol/L (ref 135–145)
Total Bilirubin: 0.7 mg/dL (ref 0.3–1.2)
Total Protein: 6.2 g/dL — ABNORMAL LOW (ref 6.5–8.1)

## 2020-09-22 LAB — CBC
HCT: 41.2 % (ref 36.0–46.0)
Hemoglobin: 12.8 g/dL (ref 12.0–15.0)
MCH: 25.4 pg — ABNORMAL LOW (ref 26.0–34.0)
MCHC: 31.1 g/dL (ref 30.0–36.0)
MCV: 81.7 fL (ref 80.0–100.0)
Platelets: 177 10*3/uL (ref 150–400)
RBC: 5.04 MIL/uL (ref 3.87–5.11)
RDW: 14.6 % (ref 11.5–15.5)
WBC: 7.9 10*3/uL (ref 4.0–10.5)
nRBC: 0 % (ref 0.0–0.2)

## 2020-09-22 LAB — CK: Total CK: 899 U/L — ABNORMAL HIGH (ref 38–234)

## 2020-09-22 LAB — GLUCOSE, CAPILLARY
Glucose-Capillary: 105 mg/dL — ABNORMAL HIGH (ref 70–99)
Glucose-Capillary: 129 mg/dL — ABNORMAL HIGH (ref 70–99)
Glucose-Capillary: 137 mg/dL — ABNORMAL HIGH (ref 70–99)
Glucose-Capillary: 216 mg/dL — ABNORMAL HIGH (ref 70–99)
Glucose-Capillary: 224 mg/dL — ABNORMAL HIGH (ref 70–99)
Glucose-Capillary: 281 mg/dL — ABNORMAL HIGH (ref 70–99)
Glucose-Capillary: 55 mg/dL — ABNORMAL LOW (ref 70–99)
Glucose-Capillary: 95 mg/dL (ref 70–99)

## 2020-09-22 MED ORDER — DEXTROSE 50 % IV SOLN
INTRAVENOUS | Status: AC
  Filled 2020-09-22: qty 50

## 2020-09-22 MED ORDER — LORAZEPAM 2 MG/ML IJ SOLN
INTRAMUSCULAR | Status: AC
  Filled 2020-09-22: qty 1

## 2020-09-22 MED ORDER — KCL IN DEXTROSE-NACL 20-5-0.9 MEQ/L-%-% IV SOLN
INTRAVENOUS | Status: DC
  Filled 2020-09-22 (×5): qty 1000

## 2020-09-22 MED ORDER — INSULIN ASPART 100 UNIT/ML ~~LOC~~ SOLN
0.0000 [IU] | SUBCUTANEOUS | Status: DC
  Administered 2020-09-22: 3 [IU] via SUBCUTANEOUS
  Administered 2020-09-22: 2 [IU] via SUBCUTANEOUS
  Administered 2020-09-23 (×2): 1 [IU] via SUBCUTANEOUS
  Administered 2020-09-23: 3 [IU] via SUBCUTANEOUS
  Administered 2020-09-23: 1 [IU] via SUBCUTANEOUS
  Administered 2020-09-23 – 2020-09-24 (×3): 2 [IU] via SUBCUTANEOUS
  Administered 2020-09-24: 4 [IU] via SUBCUTANEOUS
  Administered 2020-09-24: 3 [IU] via SUBCUTANEOUS
  Administered 2020-09-25: 1 [IU] via SUBCUTANEOUS
  Administered 2020-09-25: 3 [IU] via SUBCUTANEOUS
  Administered 2020-09-25: 6 [IU] via SUBCUTANEOUS
  Administered 2020-09-26: 10 [IU] via SUBCUTANEOUS
  Administered 2020-09-26: 1 [IU] via SUBCUTANEOUS
  Administered 2020-09-26: 2 [IU] via SUBCUTANEOUS
  Administered 2020-09-27: 4 [IU] via SUBCUTANEOUS
  Administered 2020-09-27: 3 [IU] via SUBCUTANEOUS
  Administered 2020-09-28: 2 [IU] via SUBCUTANEOUS
  Administered 2020-09-28 – 2020-09-29 (×2): 3 [IU] via SUBCUTANEOUS
  Administered 2020-09-29: 1 [IU] via SUBCUTANEOUS
  Administered 2020-09-29: 3 [IU] via SUBCUTANEOUS
  Administered 2020-09-29: 4 [IU] via SUBCUTANEOUS

## 2020-09-22 MED ORDER — ALPRAZOLAM 0.25 MG PO TABS: 0.2500 mg | ORAL_TABLET | Freq: Once | ORAL | Status: DC

## 2020-09-22 MED ORDER — LORAZEPAM 2 MG/ML IJ SOLN
1.0000 mg | Freq: Once | INTRAMUSCULAR | Status: AC
  Administered 2020-09-22: 1 mg via INTRAVENOUS

## 2020-09-22 NOTE — Progress Notes (Signed)
VASCULAR LAB    Bilateral lower extremity venous duplex has been performed.  See CV proc for preliminary results.   Delmas Faucett, RVT 09/22/2020, 2:26 PM
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
## 2020-09-22 NOTE — Progress Notes (Signed)
[facility]  PROGRESS NOTE  Donna Harrell XKG:[id number] DOB: 1955/10/27 DOA: 09/17/2020 PCP: Pcp, No   Brief HPI:   65 year old female with medical history of diabetes mellitus type 1, depression, hypertension, was presented with confusion.  Patient lives alone per family and has not been consistent taking her insulin.  Also patient has been very insistent on losing diet and has been eating lettuce and shame.  She has had multiple episodes of hypoglycemia as per sister and many times EMS has been called for that.  She is also not compliant with taking insulin.  When EMS was called to home she was found covered in feces and urine.  As per patient's sister for past 2 days they were unable to contact her.  In the ED patient was found to be in DKA also altered mental status.  She was started on IV insulin per DKA protocol.  CT head was unremarkable.    Subjective   Patient seen and examined, she is somnolent but arousable.  Refusing to eat.   Assessment/Plan:     1. Metabolic encephalopathy/delirium-, likely multifactorial due to underlying DKA also having recurrent episodes of hypoglycemia at home.    As per patient's sister she was having multiple hypoglycemic episodes at home for which EMS was called repeatedly.  Patient was insistent on losing weight and has been only eating lettuce and shrimp 4 days.  Also not been compliant with insulin.  CT head was unremarkable, MRI and EEG were obtained.  MRI was unremarkable, EEG showed diffuse slowing, no epileptiform discharges noted.   I called and discussed with neurology, Dr. Amada Jupiter, patient started on  thiamine 500 mg IV every 8 hours for 3 days.  Thiamine level has already been drawn.  We will follow labs.  Ammonia level is 11, HIV nonreactive, B12 level 374, TSH 2.478.   2. Poor p.o. intake-patient has history of poor p.o. intake at home, she has been eating only salads and shrimp as per patient sister.  She was having multiple  episodes of hypoglycemia at home.  Likely underlying behavioral disorder, will consult psychiatry to rule out possible anorexia nervosa. 3. Diabetic ketoacidosis-resolved, patient presented with diabetic ketoacidosis.  Started on IV insulin per DKA protocol.  IV insulin was discontinued patient started on Lantus.Tenna Child evaluation obtained yesterday, patient started on clear liquid diet.    4. Hypertension-blood pressure is elevated, continue hydralazine 10 mg IV every 4 hours as needed. 5. Hypoglycemia-patient having episodes of hypoglycemia due to poor p.o. intake, will switch sliding scale to very sensitive sliding scale. 6. Hypokalemia-replete 7. Elevated troponin/?  NSTEMI-patient's troponin went up to 683, EKG showed nonspecific ST changes.  Cardiology was consulted, no intervention recommended at this time. 8. Elevated CK-likely from being on the floor for 2 days.  Started on rehydration as above for DKA.  CK is now down to 899.  Continue IV normal saline.   9. Lactic acidosis-patient presented with both  anion gap metabolic acidosis as well as non-anion gap metabolic acidosis.  Likely from dehydration.  Resolved.  Bicarb is back to normal. 10. 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  Patient's right eyeball was found to be hyperdense on CT head.  I called and discussed with patient's sister who says that she had 2 surgeries on right eye but her vision could not be restored. 11. Bilateral leg swelling-patient has increased feeling of the lower extremities, left more than right.  Will obtain venous duplex of lower extremities to  rule out DVT. 12. Parotid gland lesion-MRI shows 1.6 cm lesion within the superficial lobe of the left parotid gland, partially visualized.  Could be artifact versus primary neoplasm.  Dedicated CT soft tissue neck with contrast suggested when patient is more able to tolerate.      COVID-19 Labs  No results for input(s): DDIMER, FERRITIN, LDH, CRP in  the last 72 hours.  Lab Results  Component Value Date   SARSCOV2NAA NEGATIVE 09/17/2020     Scheduled medications:   . chlorhexidine  15 mL Mouth Rinse BID  . Chlorhexidine Gluconate Cloth  6 each Topical Daily  . dextrose      . insulin aspart  0-6 Units Subcutaneous Q4H  . insulin glargine  10 Units Subcutaneous QHS  . mouth rinse  15 mL Mouth Rinse q12n4p  . metoprolol tartrate  5 mg Intravenous Q6H         CBG: Recent Labs  Lab 09/21/20 2334 09/22/20 0325 09/22/20 0452 09/22/20 0728 09/22/20 1147  GLUCAP 129* 55* 137* 95 105*    SpO2: 98 %    CBC: Recent Labs  Lab 09/17/20 1720 09/17/20 1829 09/19/20 0217 09/22/20 0354  WBC 20.7*  --  14.8* 7.9  NEUTROABS 17.9*  --   --   --   HGB 12.4 14.3 12.4 12.8  HCT 41.8 42.0 40.4 41.2  MCV 84.1  --  81.6 81.7  PLT 254  --  167 177    Basic Metabolic Panel: Recent Labs  Lab 09/18/20 0130 09/18/20 0757 09/19/20 0923 09/19/20 1312 09/20/20 0311 09/21/20 0825 09/22/20 0354  NA 146*   < > 147* 147* 140 140 137  K 4.8   < > 3.8 4.0 3.1* 3.9 3.4*  CL 116*   < > 116* 116* 105 108 103  CO2 15*   < > 22 20* 24 22 28   GLUCOSE 139*   < > 157* 116* 166* 136* 188*  BUN 22   < > 7* 6* 5* 9 7*  CREATININE 0.86   < > 0.61 0.65 0.58 0.53 0.57  CALCIUM 9.4   < > 9.5 9.3 9.3 9.1 9.2  MG 2.5*  --   --   --   --   --   --   PHOS 2.9  --   --   --   --   --   --    < > = values in this interval not displayed.     Liver Function Tests: Recent Labs  Lab 09/17/20 1720 09/19/20 0217 09/22/20 0354  AST 40 70* 55*  ALT 30 32 36  ALKPHOS 76 58 64  BILITOT 1.9* 0.9 0.7  PROT 7.6 6.2* 6.2*  ALBUMIN 4.3 3.4* 3.3*     Antibiotics: Anti-infectives (From admission, onward)   None       DVT prophylaxis: SCDs  Code Status: Full code  Family Communication: Discussed with patient sister on phone   Consultants:  Neurology  Procedures:      Objective   Vitals:   09/22/20 0400 09/22/20 0626 09/22/20  0700 09/22/20 0800  BP: (!) 176/70 (!) 156/60  (!) 188/90  Pulse: 83 85 87 91  Resp: 13 16 15  (!) 23  Temp:    98 F (36.7 C)  TempSrc:    Axillary  SpO2: 99% 99% 99% 98%  Weight:      Height:        Intake/Output Summary (Last 24 hours) at 09/22/2020 1227 Last data filed  at 09/22/2020 0920 Gross per 24 hour  Intake 0 ml  Output 1600 ml  Net -1600 ml    03/05 1901 - 03/07 0700 In: 955 [I.V.:955] Out: 1800 [Urine:1800]  Filed Weights   09/17/20 1856  Weight: 65.8 kg    Physical Examination:   General-appears in no acute distress Heart-S1-S2, regular, no murmur auscultated Lungs-clear to auscultation bilaterally, no wheezing or crackles auscultated Abdomen-soft, nontender, no organomegaly Extremities-trace edema in the lower extremities bilaterally Neuro-somnolent, arousable, follows commands.  Status is: Inpatient  Dispo: The patient is from: Home              Anticipated d/c is to: Skilled nursing facility              Anticipated d/c date is: 09/24/2020              Patient currently not stable for discharge  Barrier to discharge-ongoing treatment for metabolic encephalopathy      Data Reviewed:   Recent Results (from the past 240 hour(s))  Urine culture     Status: Abnormal   Collection Time: 09/17/20  7:32 PM   Specimen: Urine, Random  Result Value Ref Range Status   Specimen Description   Final    URINE, RANDOM Performed at [facility], [address] [place], [place] [address]    Special Requests   Final    NONE Performed at [facility], [address] [place], [place] [address]    Culture MULTIPLE SPECIES PRESENT, SUGGEST RECOLLECTION (A)  Final   Report Status 09/19/2020 FINAL  Final  Resp Panel by RT-PCR (Flu A&B, Covid) Nasopharyngeal Swab     Status: None   Collection Time: 09/17/20  8:45 PM   Specimen: Nasopharyngeal Swab; Nasopharyngeal(NP) swabs in vial transport medium  Result Value Ref Range  Status   SARS Coronavirus 2 by RT PCR NEGATIVE NEGATIVE Final    Comment: (NOTE) SARS-CoV-2 target nucleic acids are NOT DETECTED.  The SARS-CoV-2 RNA is generally detectable in upper respiratory specimens during the acute phase of infection. The lowest concentration of SARS-CoV-2 viral copies this assay can detect is 138 copies/mL. A negative result does not preclude SARS-Cov-2 infection and should not be used as the sole basis for treatment or other patient management decisions. A negative result may occur with  improper specimen collection/handling, submission of specimen other than nasopharyngeal swab, presence of viral mutation(s) within the areas targeted by this assay, and inadequate number of viral copies(<138 copies/mL). A negative result must be combined with clinical observations, patient history, and epidemiological information. The expected result is Negative.  Fact Sheet for Patients:  BloggerCourse.comhttps://www.fda.gov/media/152166/download  Fact Sheet for Healthcare Providers:  SeriousBroker.ithttps://www.fda.gov/media/152162/download  This test is no t yet approved or cleared by the [place] FDA and  has been authorized for detection and/or diagnosis of SARS-CoV-2 by FDA under an Emergency Use Authorization (EUA). This EUA will remain  in effect (meaning this test can be used) for the duration of the COVID-19 declaration under Section 564(b)(1) of the Act, 21 U.S.C.section 360bbb-3(b)(1), unless the authorization is terminated  or revoked sooner.       Influenza A by PCR NEGATIVE NEGATIVE Final   Influenza B by PCR NEGATIVE NEGATIVE Final    Comment: (NOTE) The Xpert Xpress SARS-CoV-2/FLU/RSV plus assay is intended as an aid in the diagnosis of influenza from Nasopharyngeal swab specimens and should not be used as a sole basis for treatment. Nasal washings and aspirates are unacceptable  for Xpert Xpress SARS-CoV-2/FLU/RSV testing.  Fact Sheet for  Patients: BloggerCourse.com  Fact Sheet for Healthcare Providers: SeriousBroker.it  This test is not yet approved or cleared by the [place] FDA and has been authorized for detection and/or diagnosis of SARS-CoV-2 by FDA under an Emergency Use Authorization (EUA). This EUA will remain in effect (meaning this test can be used) for the duration of the COVID-19 declaration under Section 564(b)(1) of the Act, 21 U.S.C. section 360bbb-3(b)(1), unless the authorization is terminated or revoked.  Performed at [facility] [place], [address] [place], [place] [address]   MRSA PCR Screening     Status: None   Collection Time: 09/18/20 12:21 AM   Specimen: Nasopharyngeal  Result Value Ref Range Status   MRSA by PCR NEGATIVE NEGATIVE Final    Comment:        The GeneXpert MRSA Assay (FDA approved for NASAL specimens only), is one component of a comprehensive MRSA colonization surveillance program. It is not intended to diagnose MRSA infection nor to guide or monitor treatment for MRSA infections. Performed at [facility], [address] [place], [place] [address]     No results for input(s): LIPASE, AMYLASE in the last 168 hours. Recent Labs  Lab 09/18/20 0130  AMMONIA 11    Cardiac Enzymes: Recent Labs  Lab 09/18/20 0130 09/19/20 1123 09/20/20 0311 09/21/20 0825 09/22/20 0354  CKTOTAL 941* 2,089* 2,316* 1,631* 899*   BNP (last 3 results) No results for input(s): BNP in the last 8760 hours.  ProBNP (last 3 results) No results for input(s): PROBNP in the last 8760 hours.  Studies:  No results found.     Meredeth Ide   [facility] If 7PM-7AM, please contact night-coverage at [email or web address], Office  [phone]   09/22/2020, 12:27 PM  LOS: 4 days
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
## 2020-09-22 NOTE — Plan of Care (Signed)
  Problem: Safety: Goal: Non-violent Restraint(s) Outcome: Progressing   Problem: Education: Goal: Knowledge of General Education information will improve Description: Including pain rating scale, medication(s)/side effects and non-pharmacologic comfort measures Outcome: Not Progressing   Problem: Health Behavior/Discharge Planning: Goal: Ability to manage health-related needs will improve Outcome: Not Progressing   Problem: Nutrition: Goal: Adequate nutrition will be maintained Outcome: Not Progressing   Problem: Coping: Goal: Level of anxiety will decrease Outcome: Progressing   Problem: Education: Goal: Ability to describe self-care measures that may prevent or decrease complications (Diabetes Survival Skills Education) will improve Outcome: Not Progressing

## 2020-09-22 NOTE — Progress Notes (Signed)
Inpatient Diabetes Program Recommendations  AACE/ADA: New Consensus Statement on Inpatient Glycemic Control (2015)  Target Ranges:  Prepandial:   less than 140 mg/dL      Peak postprandial:   less than 180 mg/dL (1-2 hours)      Critically ill patients:  140 - 180 mg/dL   Lab Results  Component Value Date   GLUCAP 95 09/22/2020   HGBA1C 7.1 (H) 09/19/2020    Review of Glycemic Control Results for Donna Harrell, Donna Harrell (MRN [id number]) as of 09/22/2020 09:39  Ref. Range 09/21/2020 19:35 09/21/2020 23:34 09/22/2020 03:25 09/22/2020 04:52 09/22/2020 07:28  Glucose-Capillary Latest Ref Range: 70 - 99 mg/dL 379 (H) 024 (H) 55 (L) 137 (H) 95    Diabetes history: DM1 Outpatient Diabetes medications: Lantus 8 units QHS, Humalog - ? dose Current orders for Inpatient glycemic control: Lantus 10 units + Novolog 0-9 units q 4 hrs.  Inpatient Diabetes Program Recommendations:    CBG 55 post Novolog correction -Consider decrease Novolog correction to 0-6 units  Thank you, Darel Hong E. Domonic Kimball, RN, MSN, CDE  Diabetes Coordinator Inpatient Glycemic Control Team Team Pager [phone] (8am-5pm) 09/22/2020 9:41 AM

## 2020-09-22 NOTE — TOC Progression Note (Signed)
Transition of Care [facility]) - Progression Note    Patient Details  Name: Donna Harrell MRN: [id number] Date of Birth: Aug 21, 1955  Transition of Care [facility]) CM/SW Contact  Golda Acre, RN Phone Number: 09/22/2020, 9:38 AM  Clinical Narrative:    1. Metabolic encephalopathy/delirium-, likely multifactorial due to underlying DKA also having recurrent episodes of hypoglycemia at home.    As per patient's sister she was having multiple hypoglycemic episodes at home for which EMS was called repeatedly.  Patient was insistent on losing weight and has been only eating lettuce and shrimp 4 days.  Also not been compliant with insulin.  CT head was unremarkable, MRI and EEG were obtained.  MRI was unremarkable, EEG showed diffuse slowing, no epileptiform discharges noted.   I called and discussed with neurology, Dr. Amada Jupiter, patient started on  thiamine 500 mg IV every 8 hours for 3 days.  Thiamine level has already been drawn.  We will follow labs.  Ammonia level is 11, HIV nonreactive, B12 level 374, TSH 2.478.   2. Diabetic ketoacidosis-resolved, patient presented with diabetic ketoacidosis.  Started on IV insulin per DKA protocol.  IV insulin was discontinued patient started on Lantus.Tenna Child evaluation obtained yesterday, patient started on clear liquid diet.    3. Hypokalemia-replete 4. Elevated troponin/?  NSTEMI-patient's troponin went up to 683, EKG showed nonspecific ST changes.  Cardiology was consulted, no intervention recommended at this time. 5. Elevated CK-likely from being on the floor for 2 days.  Started on rehydration as above for DKA.  CK is now down to 1631.  Continue IV normal saline.   6. Lactic acidosis-patient presented with both  anion gap metabolic acidosis as well as non-anion gap metabolic acidosis.  Likely from dehydration.  Resolved.  Bicarb is back to normal. 7. 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  Patient's right eyeball was found to be hyperdense  on CT head.  I called and discussed with patient's sister who says that she had 2 surgeries on right eye but her vision could not be restored. 8. Parotid gland lesion-MRI shows 1.6 cm lesion within the superficial lobe of the left parotid gland, partially visualized.  Could be artifact versus primary neoplasm.  Dedicated CT soft tissue neck with contrast suggested when patient is more able to tolerate. PLAN: following for toc needs   Expected Discharge Plan: Home/Self Care Barriers to Discharge: Continued Medical Work up  Expected Discharge Plan and Services Expected Discharge Plan: Home/Self Care   Discharge Planning Services: CM Consult   Living arrangements for the past 2 months: Apartment                                       Social Determinants of Health (SDOH) Interventions    Readmission Risk Interventions No flowsheet data found.

## 2020-09-22 NOTE — Progress Notes (Signed)
SLP Cancellation Note  Patient Details Name: Donna Harrell MRN: [id number] DOB: [birth year]   Cancelled treatment:       Reason Eval/Treat Not Completed: Patient declined, no reason specified. Patient agreeable only to minimal oral care with toothette sponge. She verbally refused all PO's and did not consistently respond to SLP's questions. Per RN, she has been refusing all PO's for past 2-3 days. SLP will continue to follow patient but she will likely need alternative nutrition.   Angela Nevin, MA, CCC-SLP Speech Therapy

## 2020-09-23 DIAGNOSIS — R630 Anorexia: Secondary | ICD-10-CM | POA: Diagnosis not present

## 2020-09-23 DIAGNOSIS — E1010 Type 1 diabetes mellitus with ketoacidosis without coma: Secondary | ICD-10-CM | POA: Diagnosis not present

## 2020-09-23 DIAGNOSIS — R778 Other specified abnormalities of plasma proteins: Secondary | ICD-10-CM | POA: Diagnosis not present

## 2020-09-23 DIAGNOSIS — R748 Abnormal levels of other serum enzymes: Secondary | ICD-10-CM | POA: Diagnosis not present

## 2020-09-23 LAB — BASIC METABOLIC PANEL
Anion gap: 12 (ref 5–15)
BUN: 8 mg/dL (ref 8–23)
CO2: 20 mmol/L — ABNORMAL LOW (ref 22–32)
Calcium: 9.6 mg/dL (ref 8.9–10.3)
Chloride: 107 mmol/L (ref 98–111)
Creatinine, Ser: 0.7 mg/dL (ref 0.44–1.00)
GFR, Estimated: >60 mL/min (ref >60)
Glucose, Bld: 210 mg/dL — ABNORMAL HIGH (ref 70–99)
Potassium: 4.8 mmol/L (ref 3.5–5.1)
Sodium: 139 mmol/L (ref 135–145)

## 2020-09-23 LAB — CK: Total CK: 461 U/L — ABNORMAL HIGH (ref 38–234)

## 2020-09-23 LAB — GLUCOSE, CAPILLARY
Glucose-Capillary: 207 mg/dL — ABNORMAL HIGH (ref 70–99)
Glucose-Capillary: 171 mg/dL — ABNORMAL HIGH (ref 70–99)
Glucose-Capillary: 169 mg/dL — ABNORMAL HIGH (ref 70–99)
Glucose-Capillary: 184 mg/dL — ABNORMAL HIGH (ref 70–99)
Glucose-Capillary: 288 mg/dL — ABNORMAL HIGH (ref 70–99)

## 2020-09-23 MED ORDER — QUETIAPINE FUMARATE 25 MG PO TABS
12.5000 mg | ORAL_TABLET | Freq: Two times a day (BID) | ORAL | Status: DC
  Administered 2020-09-23 – 2020-09-27 (×5): 12.5 mg via ORAL
  Filled 2020-09-23 (×12): qty 1

## 2020-09-23 MED ORDER — LIP MEDEX EX OINT
TOPICAL_OINTMENT | CUTANEOUS | Status: AC
  Filled 2020-09-23: qty 7

## 2020-09-23 MED ORDER — LORAZEPAM 0.5 MG PO TABS: 0.5000 mg | ORAL_TABLET | Freq: Two times a day (BID) | ORAL | Status: DC | PRN

## 2020-09-23 NOTE — Evaluation (Signed)
Physical Therapy Evaluation Patient Details Name: Donna Harrell MRN: [id number] DOB: [birth year] Today's Date: 09/23/2020   History of Present Illness  Pt is 65 yo female admitted with DKA, AMS, metabolic encephalopathy/delirium.  Pt was agitated and aggressive with staff on 09/22/20, in restraints but still required Ativan.  Pt with PMH of diabetes mellitus type 1, depression, hypertension  Clinical Impression  Pt admitted with above diagnosis. Pt with very limited evaluation due to lethargy/unarousable likely due to Ativan last night.  Performed bed mobility for ADLs and sat pt EOB in hopes of increasing alertness.  She did open eyes briefly, but not following commands, holding self up, and with limited verbal response.   Pt currently with functional limitations due to the deficits listed below (see PT Problem List). Pt will benefit from skilled PT to increase their independence and safety with mobility to allow discharge to the venue listed below.  Pt needs further assessment to determine appropriate venue at d/c and DME needs due to eval limited by lethargy.     Follow Up Recommendations SNF;Other (comment) (limited eval, needs further assessment)    Equipment Recommendations   (limited eval needs further assessment)    Recommendations for Other Services       Precautions / Restrictions Precautions Precautions: Fall      Mobility  Bed Mobility Overal bed mobility: Needs Assistance Bed Mobility: Rolling;Sidelying to Sit;Sit to Supine Rolling: Total assist;+2 for physical assistance Sidelying to sit: Total assist;+2 for physical assistance   Sit to supine: Total assist;+2 for physical assistance   General bed mobility comments: Limited due to lethargy    Transfers                 General transfer comment: Unable to stay awake or follow commands  Ambulation/Gait                Stairs            Wheelchair Mobility    Modified Rankin (Stroke Patients  Only)       Balance Overall balance assessment: Needs assistance Sitting-balance support: Single extremity supported Sitting balance-Leahy Scale: Poor Sitting balance - Comments: Requiring mod A ; at EOB 7 mins - tried to increase arousal with verbal cues, washing face, LE AAROM, weight shifting.  Pt did open eyes for 30 sec -1 min period when at EOB and stated "hi" but otherwise did not follow any commands, shook head "yes" to laying back down                                     Pertinent Vitals/Pain Pain Assessment: No/denies pain    Home Living Family/patient expects to be discharged to:: Unsure Living Arrangements: Alone               Additional Comments: Pt too lethargic and unable to provide hx.  Per chart she lives alone.    Prior Function           Comments: Unknown; per chart pt lives alone and has been non compliant taking insulin and with diet     Hand Dominance        Extremity/Trunk Assessment   Upper Extremity Assessment Upper Extremity Assessment: Difficult to assess due to impaired cognition (Difficult to assess due to lethargy.  She did spontaneously move bil UE to scratch face/back)    Lower Extremity Assessment Lower Extremity Assessment: Difficult to assess  due to impaired cognition (Limited due to lethargy, did spontaneously move to position legs flexed in sidelying)    Cervical / Trunk Assessment Cervical / Trunk Assessment: Normal  Communication      Cognition Arousal/Alertness: Lethargic;Suspect due to medications Behavior During Therapy: [facility] for tasks assessed/performed Overall Cognitive Status: Difficult to assess                                 General Comments: Pt was agitated/aggressive and received Ativan last night.  She is still extremely lethargic.  Would open eyes for a few seconds with verbal/tactile, cold wash rag, sitting EOB stimuli.      General Comments General comments (skin integrity,  edema, etc.): VSS    Exercises     Assessment/Plan    PT Assessment Patient needs continued PT services  PT Problem List Decreased strength;Decreased mobility;Decreased safety awareness;Decreased range of motion;Decreased activity tolerance;Decreased cognition;Decreased balance;Decreased knowledge of use of DME;Decreased coordination       PT Treatment Interventions DME instruction;Therapeutic activities;Gait training;Therapeutic exercise;Patient/family education;Balance training;Functional mobility training    PT Goals (Current goals can be found in the Care Plan section)  Acute Rehab PT Goals Patient Stated Goal: unable PT Goal Formulation: Patient unable to participate in goal setting Time For Goal Achievement: 10/07/20 Potential to Achieve Goals: Good    Frequency Min 3X/week   Barriers to discharge        Co-evaluation               AM-PAC PT "6 Clicks" Mobility  Outcome Measure Help needed turning from your back to your side while in a flat bed without using bedrails?: Total Help needed moving from lying on your back to sitting on the side of a flat bed without using bedrails?: Total Help needed moving to and from a bed to a chair (including a wheelchair)?: Total Help needed standing up from a chair using your arms (e.g., wheelchair or bedside chair)?: Total Help needed to walk in hospital room?: Total Help needed climbing 3-5 steps with a railing? : Total 6 Click Score: 6    End of Session   Activity Tolerance: Patient limited by lethargy Patient left: in bed;with call bell/phone within reach;with bed alarm set;with nursing/sitter in room Nurse Communication: Mobility status (pt had urinated - changed sheets) PT Visit Diagnosis: Unsteadiness on feet (R26.81);Muscle weakness (generalized) (M62.81)    Time: 9622-2979 PT Time Calculation (min) (ACUTE ONLY): 22 min   Charges:   PT Evaluation $PT Eval Moderate Complexity: 1 Andi Hence,  PT Acute Rehab Services Pager [phone] [facility] [phone]    Rayetta Humphrey 09/23/2020, 1:03 PM

## 2020-09-23 NOTE — Progress Notes (Signed)
[facility]  PROGRESS NOTE  Donna Harrell ZOX:[id number] DOB: 1956/02/06 DOA: 09/17/2020 PCP: Pcp, No   Brief HPI:   65 year old female with medical history of diabetes mellitus type 1, depression, hypertension, was presented with confusion.  Patient lives alone per family and has not been consistent taking her insulin.  Also patient has been very insistent on losing diet and has been eating lettuce and shame.  She has had multiple episodes of hypoglycemia as per sister and many times EMS has been called for that.  She is also not compliant with taking insulin.  When EMS was called to home she was found covered in feces and urine.  As per patient's sister for past 2 days they were unable to contact her.  In the ED patient was found to be in DKA also altered mental status.  She was started on IV insulin per DKA protocol.  CT head was unremarkable.  DKA resolved.  Patient has history of poor p.o. intake, concern for anorexia nervosa.  She has been having multiple episodes of hypoglycemia as above.  In the hospital patient has been refusing to eat.  Psychiatric consultation has been obtained.  Will also obtain palliative care consultation for further clarification of goals of care.    Subjective   Patient seen and examined, became agitated last night.  She received Ativan and has been somnolent.   Assessment/Plan:     1. Metabolic encephalopathy/delirium-,multifactorial due to underlying DKA also having recurrent episodes of hypoglycemia at home.    As per patient's sister she was having multiple hypoglycemic episodes at home for which EMS was called repeatedly.  Patient was insistent on losing weight and has been only eating lettuce and shrimp 4 days.  Also not been compliant with insulin.  CT head was unremarkable, MRI and EEG were obtained.  MRI was unremarkable, EEG showed diffuse slowing, no epileptiform discharges noted.   I called and discussed with neurology, Dr. Amada Jupiter, patient  was started on   thiamine 500 mg IV every 8 hours for 3 days.  Thiamine level has already been drawn.  We will follow labs.  Ammonia level is 11, HIV nonreactive, B12 level 374, TSH 2.478.   2. Anorexia/poor p.o. intake-patient has history of poor p.o. intake at home, she has been eating only salads and shrimp as per patient sister.  She was having multiple episodes of hypoglycemia at home.  Likely underlying behavioral disorder, psychiatry has been consulted to rule out anorexia nervosa.  We will also consult palliative care for further care discussion of goals of care. 3. Diabetic ketoacidosis-resolved, patient presented with diabetic ketoacidosis.  She was started on IV insulin per DKA protocol.  IV insulin was discontinued after DKA resolved and patient started on Lantus.  Swallow valuation obtained and patient started on clear liquid diet.  Patient has very poor p.o. intake and is refusing to eat by mouth.      4. Hypertension-blood pressure is elevated, continue hydralazine 10 mg IV every 4 hours as needed. 5. Hypoglycemia-patient having episodes of hypoglycemia due to poor p.o. intake, continue very sensitive sliding scale with NovoLog.  CBG well controlled. 6. Hypokalemia-replete 7. Elevated troponin/?  NSTEMI-patient's troponin went up to 683, EKG showed nonspecific ST changes.  Cardiology was consulted, no intervention recommended at this time. 8. Elevated CK-likely from being on the floor for 2 days.  Patient presented with CK of 2316.  Started on rehydration as above for DKA.  CK is now down to 461.  Continue IV normal saline.   9. Lactic acidosis-patient presented with both  anion gap metabolic acidosis as well as non-anion gap metabolic acidosis.  Likely from dehydration.  Resolved.  Bicarb is back to normal. 10. 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  Patient's right eyeball was found to be hyperdense on CT head.  I called and discussed with patient's sister who says that she  had 2 surgeries on right eye but her vision could not be restored. 11. Bilateral leg swelling-patient has increased feeling of the lower extremities, left more than right.  Venous duplex of lower extremities was negative for DVT bilaterally. 12. Parotid gland lesion-MRI shows 1.6 cm lesion within the superficial lobe of the left parotid gland, partially visualized.  Could be artifact versus primary neoplasm.  Dedicated CT soft tissue neck with contrast suggested when patient is more able to tolerate.      COVID-19 Labs  No results for input(s): DDIMER, FERRITIN, LDH, CRP in the last 72 hours.  Lab Results  Component Value Date   SARSCOV2NAA NEGATIVE 09/17/2020     Scheduled medications:   . chlorhexidine  15 mL Mouth Rinse BID  . Chlorhexidine Gluconate Cloth  6 each Topical Daily  . insulin aspart  0-6 Units Subcutaneous Q4H  . insulin glargine  10 Units Subcutaneous QHS  . mouth rinse  15 mL Mouth Rinse q12n4p  . metoprolol tartrate  5 mg Intravenous Q6H  . QUEtiapine  12.5 mg Oral BID         CBG: Recent Labs  Lab 09/22/20 2107 09/22/20 2353 09/23/20 0401 09/23/20 0739 09/23/20 1134  GLUCAP 281* 216* 207* 171* 169*    SpO2: 98 %    CBC: Recent Labs  Lab 09/17/20 1720 09/17/20 1829 09/19/20 0217 09/22/20 0354  WBC 20.7*  --  14.8* 7.9  NEUTROABS 17.9*  --   --   --   HGB 12.4 14.3 12.4 12.8  HCT 41.8 42.0 40.4 41.2  MCV 84.1  --  81.6 81.7  PLT 254  --  167 177    Basic Metabolic Panel: Recent Labs  Lab 09/18/20 0130 09/18/20 0757 09/19/20 1312 09/20/20 0311 09/21/20 0825 09/22/20 0354 09/23/20 0305  NA 146*   < > 147* 140 140 137 139  K 4.8   < > 4.0 3.1* 3.9 3.4* 4.8  CL 116*   < > 116* 105 108 103 107  CO2 15*   < > 20* 24 22 28  20*  GLUCOSE 139*   < > 116* 166* 136* 188* 210*  BUN 22   < > 6* 5* 9 7* 8  CREATININE 0.86   < > 0.65 0.58 0.53 0.57 0.70  CALCIUM 9.4   < > 9.3 9.3 9.1 9.2 9.6  MG 2.5*  --   --   --   --   --   --   PHOS  2.9  --   --   --   --   --   --    < > = values in this interval not displayed.     Liver Function Tests: Recent Labs  Lab 09/17/20 1720 09/19/20 0217 09/22/20 0354  AST 40 70* 55*  ALT 30 32 36  ALKPHOS 76 58 64  BILITOT 1.9* 0.9 0.7  PROT 7.6 6.2* 6.2*  ALBUMIN 4.3 3.4* 3.3*     Antibiotics: Anti-infectives (From admission, onward)   None       DVT prophylaxis: SCDs  Code Status:  Full code  Family Communication: Discussed with patient sister on phone   Consultants:  Neurology  Procedures:      Objective   Vitals:   09/23/20 0400 09/23/20 0600 09/23/20 0800 09/23/20 1200  BP: 140/77 (!) 134/51    Pulse:  95    Resp: 16 16    Temp: 98.5 F (36.9 C)  99.1 F (37.3 C) 100.1 F (37.8 C)  TempSrc: Oral  Axillary Axillary  SpO2:  98%    Weight:      Height:        Intake/Output Summary (Last 24 hours) at 09/23/2020 1355 Last data filed at 09/23/2020 2725 Gross per 24 hour  Intake 1226.31 ml  Output --  Net 1226.31 ml    03/06 1901 - 03/08 0700 In: 1226.3 [I.V.:1226.3] Out: 900 [Urine:900]  Filed Weights   09/17/20 1856  Weight: 65.8 kg    Physical Examination:   General-appears in no acute distress Heart-S1-S2, regular, no murmur auscultated Lungs-clear to auscultation bilaterally, no wheezing or crackles auscultated Abdomen-soft, nontender, no organomegaly Extremities-trace edema in the lower extremities Neuro- somnolent, arousable on verbal stimuli, then again falls back to sleep.  Status is: Inpatient  Dispo: The patient is from: Home              Anticipated d/c is to: Skilled nursing facility              Anticipated d/c date is: 09/24/2020              Patient currently not stable for discharge  Barrier to discharge-ongoing treatment for metabolic encephalopathy, poor p.o. intake      Data Reviewed:   Recent Results (from the past 240 hour(s))  Urine culture     Status: Abnormal   Collection Time: 09/17/20  7:32 PM    Specimen: Urine, Random  Result Value Ref Range Status   Specimen Description   Final    URINE, RANDOM Performed at [facility], [address], [place], [place] [address]    Special Requests   Final    NONE Performed at [facility], [address], [place], [place] [address]    Culture MULTIPLE SPECIES PRESENT, SUGGEST RECOLLECTION (A)  Final   Report Status 09/19/2020 FINAL  Final  Resp Panel by RT-PCR (Flu A&B, Covid) Nasopharyngeal Swab     Status: None   Collection Time: 09/17/20  8:45 PM   Specimen: Nasopharyngeal Swab; Nasopharyngeal(NP) swabs in vial transport medium  Result Value Ref Range Status   SARS Coronavirus 2 by RT PCR NEGATIVE NEGATIVE Final    Comment: (NOTE) SARS-CoV-2 target nucleic acids are NOT DETECTED.  The SARS-CoV-2 RNA is generally detectable in upper respiratory specimens during the acute phase of infection. The lowest concentration of SARS-CoV-2 viral copies this assay can detect is 138 copies/mL. A negative result does not preclude SARS-Cov-2 infection and should not be used as the sole basis for treatment or other patient management decisions. A negative result may occur with  improper specimen collection/handling, submission of specimen other than nasopharyngeal swab, presence of viral mutation(s) within the areas targeted by this assay, and inadequate number of viral copies(<138 copies/mL). A negative result must be combined with clinical observations, patient history, and epidemiological information. The expected result is Negative.  Fact Sheet for Patients:  BloggerCourse.com  Fact Sheet for Healthcare Providers:  SeriousBroker.it  This test is no t yet approved or cleared by the [place] FDA and  has  been authorized for detection and/or diagnosis of SARS-CoV-2 by FDA under an Emergency Use Authorization (EUA). This EUA will remain  in effect  (meaning this test can be used) for the duration of the COVID-19 declaration under Section 564(b)(1) of the Act, 21 U.S.C.section 360bbb-3(b)(1), unless the authorization is terminated  or revoked sooner.       Influenza A by PCR NEGATIVE NEGATIVE Final   Influenza B by PCR NEGATIVE NEGATIVE Final    Comment: (NOTE) The Xpert Xpress SARS-CoV-2/FLU/RSV plus assay is intended as an aid in the diagnosis of influenza from Nasopharyngeal swab specimens and should not be used as a sole basis for treatment. Nasal washings and aspirates are unacceptable for Xpert Xpress SARS-CoV-2/FLU/RSV testing.  Fact Sheet for Patients: BloggerCourse.com  Fact Sheet for Healthcare Providers: SeriousBroker.it  This test is not yet approved or cleared by the [place] FDA and has been authorized for detection and/or diagnosis of SARS-CoV-2 by FDA under an Emergency Use Authorization (EUA). This EUA will remain in effect (meaning this test can be used) for the duration of the COVID-19 declaration under Section 564(b)(1) of the Act, 21 U.S.C. section 360bbb-3(b)(1), unless the authorization is terminated or revoked.  Performed at [facility], [address], [place], [place] [address]   MRSA PCR Screening     Status: None   Collection Time: 09/18/20 12:21 AM   Specimen: Nasopharyngeal  Result Value Ref Range Status   MRSA by PCR NEGATIVE NEGATIVE Final    Comment:        The GeneXpert MRSA Assay (FDA approved for NASAL specimens only), is one component of a comprehensive MRSA colonization surveillance program. It is not intended to diagnose MRSA infection nor to guide or monitor treatment for MRSA infections. Performed at [facility], [address], [place], [place] [address]     No results for input(s): LIPASE, AMYLASE in the last 168 hours. Recent Labs  Lab 09/18/20 0130  AMMONIA 11     Cardiac Enzymes: Recent Labs  Lab 09/19/20 1123 09/20/20 0311 09/21/20 0825 09/22/20 0354 09/23/20 0305  CKTOTAL 2,089* 2,316* 1,631* 899* 461*   BNP (last 3 results) No results for input(s): BNP in the last 8760 hours.  ProBNP (last 3 results) No results for input(s): PROBNP in the last 8760 hours.  Studies:  VAS [place] LOWER EXTREMITY VENOUS (DVT)  Result Date: 09/22/2020  Lower Venous DVT Study Indications: Edema. Other Indications: DKA,. Limitations: Altered mental status, would not follow directions. Comparison Study: No prior study on file Performing Technologist: Sherren Kerns RVS  Examination Guidelines: A complete evaluation includes B-mode imaging, spectral Doppler, color Doppler, and power Doppler as needed of all accessible portions of each vessel. Bilateral testing is considered an integral part of a complete examination. Limited examinations for reoccurring indications may be performed as noted. The reflux portion of the exam is performed with the patient in reverse Trendelenburg.  +---------+---------------+---------+-----------+----------+-------------------+ RIGHT    CompressibilityPhasicitySpontaneityPropertiesThrombus Aging      +---------+---------------+---------+-----------+----------+-------------------+ CFV      Full           Yes      Yes                                      +---------+---------------+---------+-----------+----------+-------------------+ SFJ      Full                                                             +---------+---------------+---------+-----------+----------+-------------------+  FV Prox  Full                                                             +---------+---------------+---------+-----------+----------+-------------------+ FV Mid   Full                                                             +---------+---------------+---------+-----------+----------+-------------------+ FV DistalFull                                                              +---------+---------------+---------+-----------+----------+-------------------+ PFV      Full                                                             +---------+---------------+---------+-----------+----------+-------------------+ POP                     Yes      Yes                  patent by color and                                                       Doppler             +---------+---------------+---------+-----------+----------+-------------------+ PTV                                                   Not well visualized +---------+---------------+---------+-----------+----------+-------------------+ PERO                                                  Not well visualized +---------+---------------+---------+-----------+----------+-------------------+   +---------+---------------+---------+-----------+----------+-------------------+ LEFT     CompressibilityPhasicitySpontaneityPropertiesThrombus Aging      +---------+---------------+---------+-----------+----------+-------------------+ CFV      Full           Yes      Yes                                      +---------+---------------+---------+-----------+----------+-------------------+ SFJ      Full                                                             +---------+---------------+---------+-----------+----------+-------------------+  FV Prox  Full                                                             +---------+---------------+---------+-----------+----------+-------------------+ FV Mid   Full                                                             +---------+---------------+---------+-----------+----------+-------------------+ FV DistalFull                                                             +---------+---------------+---------+-----------+----------+-------------------+ PFV      Full                                                              +---------+---------------+---------+-----------+----------+-------------------+ POP                     Yes      Yes                  patent by color and                                                       Doppler             +---------+---------------+---------+-----------+----------+-------------------+ PTV                                                   patent by color and                                                       Doppler             +---------+---------------+---------+-----------+----------+-------------------+ PERO                                                  Not well visualized +---------+---------------+---------+-----------+----------+-------------------+     Summary: RIGHT: - There is no evidence of deep vein thrombosis in the lower extremity. However, portions of this examination were limited- see technologist comments above.  LEFT: - There is no evidence of deep vein thrombosis in the  lower extremity. However, portions of this examination were limited- see technologist comments above.  *See table(s) above for measurements and observations. Electronically signed by Fabienne Bruns MD on 09/22/2020 at 9:13:05 PM.    Final        Meredeth Ide   [facility] If 7PM-7AM, please contact night-coverage at [email or web address], Office  [phone]   09/23/2020, 1:55 PM  LOS: 5 days

## 2020-09-23 NOTE — Consult Note (Signed)
  65 year old female with medical history of diabetes mellitus type 1, depression, hypertension, was presented with confusion.  Patient lives alone per family and has not been consistent taking her insulin.  Also patient has been very insistent on losing diet and has been eating lettuce and shame.  She has had multiple episodes of hypoglycemia as per sister and many times EMS has been called for that.  She is also not compliant with taking insulin.  When EMS was called to home she was found covered in feces and urine.  As per patient's sister for past 2 days they were unable to contact her.  In the ED patient was found to be in DKA also altered mental status.  She was started on IV insulin per DKA protocol.  CT head was unremarkable.   Psychiatric consult place for patient not eating and drinking, had multiple episodes of hypoglycemia at home?  Anorexia nervosa.  Patient is somnolent and unable to participate in psychiatric evaluation at this time.  She appears to be in bilateral wrist restraints.  Per chart review patient appears to have become aggressive and agitated during the night, and verbally aggressive towards staff.  She did receive a one-time order Ativan 1 mg via IV for agitation and aggression, and 2330 last night.  She appears to be responding well to this appropriate treatment.   Will attempt to reassess patient tomorrow, in hopes that she is able to participate in psychiatric evaluation.  There appears to be some concerns regarding restrictive eating behaviors, as she is reportedly wanting to lose weight.  Chart review indicates she has been eating salads and shrimp only, with resultant hypoglycemia as she is a type I diabetic.  Would encourage continuing nutrition and dietitian consults, as well diabetic educator to discuss weight gain associated with insulin-dependent diabetes.  Patient does not appear to be originally from this area, as there is no evidence of any previous history in her  chart.  Unable to identify any underlying psychiatric issues at this time, will likely need additional information from family members.  -EKG ordered, QTC of 467.  -Patient appears to respond well to previous treatment of Ativan 1 mg, will reorder at this time for aggression and agitation. -Recent EEG shows diffuse encephelopathy.  Will treat encephalopathy symptoms at this time with low-dose Seroquel 12,5mg  po BID, and Ativan 0.5 mg p.o. twice daily. -Will assess for underlying psychiatric condition, however she will likely benefit from outpatient nutritional services and diabetic educator.    -Will attempt to reassess.

## 2020-09-23 NOTE — Progress Notes (Signed)
SLP Cancellation Note  Patient Details Name: Donna Harrell MRN: [id number] DOB: April 05, 1956   Cancelled treatment:       Reason Eval/Treat Not Completed: Other (comment);Fatigue/lethargy limiting ability to participate  Per discussion with PT, pt not alert adequately for PT session or po.  Will continue efforts.   Rolena Infante, MS [facility] SLP Acute Rehab Services Office [phone] Pager [phone]   Chales Abrahams 09/23/2020, 1:11 PM

## 2020-09-23 NOTE — Progress Notes (Signed)
Pt actively yelling out and crying because she wants a blanket and is cold. Pt has been provided 3 additional blankets at this time. Each time this RN leaves the room, patient begins yelling out that no one is helping her. All attempts to reorient patient have been unsuccessful. Will continue to monitor.

## 2020-09-24 ENCOUNTER — Encounter (HOSPITAL_COMMUNITY): Payer: Self-pay | Admitting: Family Medicine

## 2020-09-24 DIAGNOSIS — R748 Abnormal levels of other serum enzymes: Secondary | ICD-10-CM | POA: Diagnosis not present

## 2020-09-24 DIAGNOSIS — E1010 Type 1 diabetes mellitus with ketoacidosis without coma: Secondary | ICD-10-CM | POA: Diagnosis not present

## 2020-09-24 DIAGNOSIS — R778 Other specified abnormalities of plasma proteins: Secondary | ICD-10-CM | POA: Diagnosis not present

## 2020-09-24 DIAGNOSIS — G9341 Metabolic encephalopathy: Secondary | ICD-10-CM | POA: Diagnosis not present

## 2020-09-24 DIAGNOSIS — R4182 Altered mental status, unspecified: Secondary | ICD-10-CM | POA: Diagnosis not present

## 2020-09-24 DIAGNOSIS — R7989 Other specified abnormal findings of blood chemistry: Secondary | ICD-10-CM | POA: Diagnosis not present

## 2020-09-24 LAB — BASIC METABOLIC PANEL
Anion gap: 9 (ref 5–15)
BUN: 5 mg/dL — ABNORMAL LOW (ref 8–23)
CO2: 23 mmol/L (ref 22–32)
Calcium: 9.1 mg/dL (ref 8.9–10.3)
Chloride: 106 mmol/L (ref 98–111)
Creatinine, Ser: 0.55 mg/dL (ref 0.44–1.00)
GFR, Estimated: >60 mL/min (ref >60)
Glucose, Bld: 114 mg/dL — ABNORMAL HIGH (ref 70–99)
Potassium: 3.4 mmol/L — ABNORMAL LOW (ref 3.5–5.1)
Sodium: 138 mmol/L (ref 135–145)

## 2020-09-24 LAB — GLUCOSE, CAPILLARY
Glucose-Capillary: 71 mg/dL (ref 70–99)
Glucose-Capillary: 75 mg/dL (ref 70–99)
Glucose-Capillary: 221 mg/dL — ABNORMAL HIGH (ref 70–99)
Glucose-Capillary: 84 mg/dL (ref 70–99)
Glucose-Capillary: 300 mg/dL — ABNORMAL HIGH (ref 70–99)
Glucose-Capillary: 306 mg/dL — ABNORMAL HIGH (ref 70–99)

## 2020-09-24 MED ORDER — SODIUM CHLORIDE 0.9% FLUSH
10.0000 mL | Freq: Two times a day (BID) | INTRAVENOUS | Status: DC
  Administered 2020-09-24 – 2020-09-30 (×11): 10 mL

## 2020-09-24 MED ORDER — LORAZEPAM 2 MG/ML IJ SOLN
1.0000 mg | Freq: Once | INTRAMUSCULAR | Status: AC
  Administered 2020-09-24: 1 mg via INTRAVENOUS
  Filled 2020-09-24: qty 1

## 2020-09-24 MED ORDER — SODIUM CHLORIDE 0.9% FLUSH: 10.0000 mL | INTRAVENOUS | Status: DC | PRN

## 2020-09-24 NOTE — TOC Progression Note (Signed)
Transition of Care [facility]) - Progression Note    Patient Details  Name: Donna Harrell MRN: [id number] Date of Birth: [birth year]  [facility]) CM/SW Contact  Golda Acre, RN Phone Number: 09/24/2020, 8:56 AM  Clinical Narrative:    1. Metabolic encephalopathy/delirium-,multifactorial due to underlying DKA also having recurrent episodes of hypoglycemia at home.    As per patient's sister she was having multiple hypoglycemic episodes at home for which EMS was called repeatedly.  Patient was insistent on losing weight and has been only eating lettuce and shrimp 4 days.  Also not been compliant with insulin.  CT head was unremarkable, MRI and EEG were obtained.  MRI was unremarkable, EEG showed diffuse slowing, no epileptiform discharges noted.   I called and discussed with neurology, Dr. Amada Jupiter, patient was started on   thiamine 500 mg IV every 8 hours for 3 days.  Thiamine level has already been drawn.  We will follow labs.  Ammonia level is 11, HIV nonreactive, B12 level 374, TSH 2.478.   2. Anorexia/poor p.o. intake-patient has history of poor p.o. intake at home, she has been eating only salads and shrimp as per patient sister.  She was having multiple episodes of hypoglycemia at home.  Likely underlying behavioral disorder, psychiatry has been consulted to rule out anorexia nervosa.  We will also consult palliative care for further care discussion of goals of care. 3. Diabetic ketoacidosis-resolved, patient presented with diabetic ketoacidosis.  She was started on IV insulin per DKA protocol.  IV insulin was discontinued after DKA resolved and patient started on Lantus.  Swallow valuation obtained and patient started on clear liquid diet.  Patient has very poor p.o. intake and is refusing to eat by mouth.      4. Hypertension-blood pressure is elevated, continue hydralazine 10 mg IV every 4 hours as needed. 5. Hypoglycemia-patient having episodes of hypoglycemia due to poor  p.o. intake, continue very sensitive sliding scale with NovoLog.  CBG well controlled. 6. Hypokalemia-replete 7. Elevated troponin/?  NSTEMI-patient's troponin went up to 683, EKG showed nonspecific ST changes.  Cardiology was consulted, no intervention recommended at this time. 8. Elevated CK-likely from being on the floor for 2 days.  Patient presented with CK of 2316.  Started on rehydration as above for DKA.  CK is now down to 461.  Continue IV normal saline.   9. Lactic acidosis-patient presented with both  anion gap metabolic acidosis as well as non-anion gap metabolic acidosis.  Likely from dehydration.  Resolved.  Bicarb is back to normal. 10. 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  Patient's right eyeball was found to be hyperdense on CT head.  I called and discussed with patient's sister who says that she had 2 surgeries on right eye but her vision could not be restored. 11. Bilateral leg swelling-patient has increased feeling of the lower extremities, left more than right.  Venous duplex of lower extremities was negative for DVT bilaterally. 12. Parotid gland lesion-MRI shows 1.6 cm lesion within the superficial lobe of the left parotid gland, partially visualized.  Could be artifact versus primary neoplasm.  Dedicated CT soft tissue neck with contrast suggested when patient is more able to tolerate. plan following for progression and toc needs/ may need placement or hhc   Expected Discharge Plan: Home/Self Care Barriers to Discharge: Continued Medical Work up  Expected Discharge Plan and Services Expected Discharge Plan: Home/Self Care   Discharge Planning Services: CM Consult   Living arrangements  for the past 2 months: Apartment                                       Social Determinants of Health (SDOH) Interventions    Readmission Risk Interventions No flowsheet data found.

## 2020-09-24 NOTE — Progress Notes (Signed)
SLP Cancellation Note  Patient Details Name: Donna Harrell MRN: [id number] DOB: [birth year]   Cancelled treatment:        Attempted to see pt for ongoing dysphagia management.  Pt's diet upgraded to regular texture (from clear liquid diet recommended 3/5 at initial SLP eval) by MD.  Pt refused all POs this date. RN reports consistent refusal of POs, including oral medications. SLP will continue to follow.   Kerrie Pleasure, MA, CCC-SLP Acute Rehabilitation Services Office: [phone] 09/24/2020, 10:53 AM

## 2020-09-24 NOTE — Progress Notes (Signed)
Pt was able to get one hand out of restraint and then used that hand to remove her other restraint. Pt then removed Midline catheter and monitoring equipment. Bleeding controlled from Midline site, restraints replaced, and new monitoring equipment placed on patient. Hospitalist notified. IV team consulted for placement of new line. Pt remains confused. Will continue to monitor patient closely.

## 2020-09-24 NOTE — Progress Notes (Signed)
PROGRESS NOTE    Donna Harrell  GUY:[id number] DOB: 1955/12/26 DOA: 09/17/2020 PCP: Pcp, No  Brief Narrative:  65 year old female with medical history of diabetes mellitus type 1, depression, hypertension, was presented with confusion. Patient lives alone per family and has not been consistent taking her insulin. Also patient has been very insistent on losing diet and has been eating lettuce and shame. She has had multiple episodes of hypoglycemia as per sister and many times EMS has been called for that. She is also not compliant with taking insulin. When EMS was called to home she was found covered in feces and urine. As per patient's sister for past 2 days they were unable to contact her. In the ED patient was found to be in DKA with continued altered mental status. DKA resolved with appropriate treatment now transitioned off IV insulin. Patient has history of poor p.o. intake. She has been having multiple episodes of hypoglycemia as above. In the hospital patient has been refusing to eat. Psychiatric consultation has been obtained. Will also obtain palliative care consultation for further clarification of goals of care.  Assessment & Plan:   Active Problems:   DKA (diabetic ketoacidosis) (HCC)   Essential hypertension   Acute metabolic encephalopathy   Elevated troponin   Elevated CK   Elevated lactic acid level   Encephalopathy  Metabolic encephalopathy/delirium, POA, ongoing- - Initially thought due to metabolic disturbances but continues despite resolution of DKA  - Psychiatry consulted for further insight and treatment  - Profound hypoglycemia previously thought to be causing patient's mental status but again hypoglycemia resolved without resolution of symptoms - CT head, MRI and EEG all unremarkable for any acute findings or epileptiform activity - DC all sedating medication at this time; if combative would prefer physical restraints or bedside sitter rather than medications so  that psych can further evaluate patient's mental status - Patient has completed high-dose IV thiamine; alcohol level within normal limits at intake - Ammonia level is 11, HIV nonreactive, B12 level 374, TSH 2.478    Failure to thrive - severe protein caloric malnutrition - Unclear etiology, appears to have intentionally been trying to lose weight in the outpatient setting per family but mental status now prohibiting patient's interaction and unable to safely feed at this time  - Family indicates patient was having multiple episodes of hypoglycemia at home.  - Questionably underlying behavioral disorder, psychiatry has been consulted to rule out anorexia nervosa.  - Palliative care for further care discussion of goals of care.  Diabetic ketoacidosis-resolved.  - Transitioned to clear diet but extremely poor p.o. intake and has been refusing to take p.o. making transition to insulin difficult.  Hypertension- - Continue hydralazine 10 mg IV every 4 hours as needed.  Hypokalemia - Repleted  Elevated troponin - Without ST elevation; Troponin peak at 600, Cardiology previously consulted, no intervention recommended at this time.  Elevated CK/rhabdomyolysis - Likely from being on the floor for 2 days. Patient presented with CK of 2316. Started on rehydration as above for DKA. CK is now down to 461. Continue IV normal saline.    Anion gap metabolic acidosis  - Secondary to lactic acid acidosis and concurrent DKA  Hyperdense right globe - Likely from surgery in the past.   - Patient's right eyeball was found to be hyperdense on CT head. I called and discussed with patient's sister who says that she had 2 surgeries on right eye but her vision could not be restored.  Bilateral leg swelling -  Venous duplex of lower extremities was negative for DVT bilaterally.  Parotid gland lesion, incidental finding - MRI shows 1.6 cm lesion within the superficial lobe of the left parotid gland, partially  visualized. Could be artifact versus primary neoplasm.  - Dedicated CT soft tissue neck with contrast suggested when patient is more able to tolerate - will likely need to be an outpatient workup at this point.  DVT prophylaxis: SCDs, early ambulation Code Status: Full Family Communication: updated sister at length over the phone  Status is: Inpt  Dispo: The patient is from: Home              Anticipated d/c is to: TBD              Anticipated d/c date is: >36h              Patient currently NOT medically stable for discharge  Consultants:   Psych, palliative, PCCM  Procedures:   None  Antimicrobials:  No indication  Subjective: No acute issues or events overnight, patient remains poorly interactive this morning, review systems markedly limited  Objective: Vitals:   09/24/20 0000 09/24/20 0200 09/24/20 0400 09/24/20 0600  BP: (!) 151/98 138/65 (!) 154/72 (!) 152/71  Pulse: 96 77    Resp: 15 13 13 17   Temp: 98.4 F (36.9 C)  97.8 F (36.6 C)   TempSrc: Oral  Axillary   SpO2: 100% 99%    Weight:      Height:        Intake/Output Summary (Last 24 hours) at 09/24/2020 0701 Last data filed at 09/24/2020 0654 Gross per 24 hour  Intake 580 ml  Output 825 ml  Net -245 ml   Filed Weights   09/17/20 1856  Weight: 65.8 kg    Examination:  General:  Pleasantly resting in bed, No acute distress.  Poorly interactive  HEENT:  Normocephalic atraumatic.  Sclerae nonicteric, noninjected.  Extraocular movements intact bilaterally. Neck:  Without mass or deformity.  Trachea is midline. Lungs:  Clear to auscultate bilaterally without rhonchi, wheeze, or rales. Heart:  Regular rate and rhythm.  Without murmurs, rubs, or gallops. Abdomen:  Soft, nontender, nondistended.  Without guarding or rebound. Extremities: Without cyanosis, clubbing, edema, or obvious deformity. Vascular:  Dorsalis pedis and posterior tibial pulses palpable bilaterally. Skin:  Warm and dry, no erythema, no  ulcerations.   Data Reviewed: I have personally reviewed following labs and imaging studies  CBC: Recent Labs  Lab 09/17/20 1720 09/17/20 1829 09/19/20 0217 09/22/20 0354  WBC 20.7*  --  14.8* 7.9  NEUTROABS 17.9*  --   --   --   HGB 12.4 14.3 12.4 12.8  HCT 41.8 42.0 40.4 41.2  MCV 84.1  --  81.6 81.7  PLT 254  --  167 177   Basic Metabolic Panel: Recent Labs  Lab 09/18/20 0130 09/18/20 0757 09/20/20 0311 09/21/20 0825 09/22/20 0354 09/23/20 0305 09/24/20 0249  NA 146*   < > 140 140 137 139 138  K 4.8   < > 3.1* 3.9 3.4* 4.8 3.4*  CL 116*   < > 105 108 103 107 106  CO2 15*   < > 24 22 28  20* 23  GLUCOSE 139*   < > 166* 136* 188* 210* 114*  BUN 22   < > 5* 9 7* 8 5*  CREATININE 0.86   < > 0.58 0.53 0.57 0.70 0.55  CALCIUM 9.4   < > 9.3 9.1 9.2 9.6 9.1  MG 2.5*  --   --   --   --   --   --   PHOS 2.9  --   --   --   --   --   --    < > = values in this interval not displayed.   GFR: Estimated Creatinine Clearance: 65.6 mL/min (by C-G formula based on SCr of 0.55 mg/dL). Liver Function Tests: Recent Labs  Lab 09/17/20 1720 09/19/20 0217 09/22/20 0354  AST 40 70* 55*  ALT 30 32 36  ALKPHOS 76 58 64  BILITOT 1.9* 0.9 0.7  PROT 7.6 6.2* 6.2*  ALBUMIN 4.3 3.4* 3.3*   No results for input(s): LIPASE, AMYLASE in the last 168 hours. Recent Labs  Lab 09/18/20 0130  AMMONIA 11   Coagulation Profile: No results for input(s): INR, PROTIME in the last 168 hours. Cardiac Enzymes: Recent Labs  Lab 09/19/20 1123 09/20/20 0311 09/21/20 0825 09/22/20 0354 09/23/20 0305  CKTOTAL 2,089* 2,316* 1,631* 899* 461*   BNP (last 3 results) No results for input(s): PROBNP in the last 8760 hours. HbA1C: No results for input(s): HGBA1C in the last 72 hours. CBG: Recent Labs  Lab 09/23/20 0739 09/23/20 1134 09/23/20 1620 09/23/20 2118 09/24/20 0450  GLUCAP 171* 169* 184* 288* 71   Lipid Profile: No results for input(s): CHOL, HDL, LDLCALC, TRIG, CHOLHDL,  LDLDIRECT in the last 72 hours. Thyroid Function Tests: No results for input(s): TSH, T4TOTAL, FREET4, T3FREE, THYROIDAB in the last 72 hours. Anemia Panel: No results for input(s): VITAMINB12, FOLATE, FERRITIN, TIBC, IRON, RETICCTPCT in the last 72 hours. Sepsis Labs: Recent Labs  Lab 09/17/20 1932 09/17/20 2310 09/18/20 0130 09/18/20 0757  LATICACIDVEN 3.1* 2.4* 3.6* 1.1    Recent Results (from the past 240 hour(s))  Urine culture     Status: Abnormal   Collection Time: 09/17/20  7:32 PM   Specimen: Urine, Random  Result Value Ref Range Status   Specimen Description   Final    URINE, RANDOM Performed at [facility], [address], [place], [place] [address]    Special Requests   Final    NONE Performed at [facility], [address] [place], [place] [address]    Culture MULTIPLE SPECIES PRESENT, SUGGEST RECOLLECTION (A)  Final   Report Status 09/19/2020 FINAL  Final  Resp Panel by RT-PCR (Flu A&B, Covid) Nasopharyngeal Swab     Status: None   Collection Time: 09/17/20  8:45 PM   Specimen: Nasopharyngeal Swab; Nasopharyngeal(NP) swabs in vial transport medium  Result Value Ref Range Status   SARS Coronavirus 2 by RT PCR NEGATIVE NEGATIVE Final    Comment: (NOTE) SARS-CoV-2 target nucleic acids are NOT DETECTED.  The SARS-CoV-2 RNA is generally detectable in upper respiratory specimens during the acute phase of infection. The lowest concentration of SARS-CoV-2 viral copies this assay can detect is 138 copies/mL. A negative result does not preclude SARS-Cov-2 infection and should not be used as the sole basis for treatment or other patient management decisions. A negative result may occur with  improper specimen collection/handling, submission of specimen other than nasopharyngeal swab, presence of viral mutation(s) within the areas targeted by this assay, and inadequate number of viral copies(<138 copies/mL). A negative result  must be combined with clinical observations, patient history, and epidemiological information. The expected result is Negative.  Fact Sheet for Patients:  BloggerCourse.com  Fact Sheet for Healthcare Providers:  SeriousBroker.it  This test is no t yet approved  or cleared by the [place] and  has been authorized for detection and/or diagnosis of SARS-CoV-2 by FDA under an Emergency Use Authorization (EUA). This EUA will remain  in effect (meaning this test can be used) for the duration of the COVID-19 declaration under Section 564(b)(1) of the Act, 21 U.S.C.section 360bbb-3(b)(1), unless the authorization is terminated  or revoked sooner.       Influenza A by PCR NEGATIVE NEGATIVE Final   Influenza B by PCR NEGATIVE NEGATIVE Final    Comment: (NOTE) The Xpert Xpress SARS-CoV-2/FLU/RSV plus assay is intended as an aid in the diagnosis of influenza from Nasopharyngeal swab specimens and should not be used as a sole basis for treatment. Nasal washings and aspirates are unacceptable for Xpert Xpress SARS-CoV-2/FLU/RSV testing.  Fact Sheet for Patients: BloggerCourse.com  Fact Sheet for Healthcare Providers: SeriousBroker.it  This test is not yet approved or cleared by the [place] FDA and has been authorized for detection and/or diagnosis of SARS-CoV-2 by FDA under an Emergency Use Authorization (EUA). This EUA will remain in effect (meaning this test can be used) for the duration of the COVID-19 declaration under Section 564(b)(1) of the Act, 21 U.S.C. section 360bbb-3(b)(1), unless the authorization is terminated or revoked.  Performed at [facility], [address] [place], [place] [address]   MRSA PCR Screening     Status: None   Collection Time: 09/18/20 12:21 AM   Specimen: Nasopharyngeal  Result Value Ref Range Status   MRSA by PCR  NEGATIVE NEGATIVE Final    Comment:        The GeneXpert MRSA Assay (FDA approved for NASAL specimens only), is one component of a comprehensive MRSA colonization surveillance program. It is not intended to diagnose MRSA infection nor to guide or monitor treatment for MRSA infections. Performed at [facility], [address], [place], [place] [address]          Radiology Studies: VAS [place] LOWER EXTREMITY VENOUS (DVT)  Result Date: 09/22/2020  Lower Venous DVT Study Indications: Edema. Other Indications: DKA,. Limitations: Altered mental status, would not follow directions. Comparison Study: No prior study on file Performing Technologist: Sherren Kerns RVS  Examination Guidelines: A complete evaluation includes B-mode imaging, spectral Doppler, color Doppler, and power Doppler as needed of all accessible portions of each vessel. Bilateral testing is considered an integral part of a complete examination. Limited examinations for reoccurring indications may be performed as noted. The reflux portion of the exam is performed with the patient in reverse Trendelenburg.  +---------+---------------+---------+-----------+----------+-------------------+ RIGHT    CompressibilityPhasicitySpontaneityPropertiesThrombus Aging      +---------+---------------+---------+-----------+----------+-------------------+ CFV      Full           Yes      Yes                                      +---------+---------------+---------+-----------+----------+-------------------+ SFJ      Full                                                             +---------+---------------+---------+-----------+----------+-------------------+ FV Prox  Full                                                             +---------+---------------+---------+-----------+----------+-------------------+  FV Mid   Full                                                              +---------+---------------+---------+-----------+----------+-------------------+ FV DistalFull                                                             +---------+---------------+---------+-----------+----------+-------------------+ PFV      Full                                                             +---------+---------------+---------+-----------+----------+-------------------+ POP                     Yes      Yes                  patent by color and                                                       Doppler             +---------+---------------+---------+-----------+----------+-------------------+ PTV                                                   Not well visualized +---------+---------------+---------+-----------+----------+-------------------+ PERO                                                  Not well visualized +---------+---------------+---------+-----------+----------+-------------------+   +---------+---------------+---------+-----------+----------+-------------------+ LEFT     CompressibilityPhasicitySpontaneityPropertiesThrombus Aging      +---------+---------------+---------+-----------+----------+-------------------+ CFV      Full           Yes      Yes                                      +---------+---------------+---------+-----------+----------+-------------------+ SFJ      Full                                                             +---------+---------------+---------+-----------+----------+-------------------+ FV Prox  Full                                                             +---------+---------------+---------+-----------+----------+-------------------+  FV Mid   Full                                                             +---------+---------------+---------+-----------+----------+-------------------+ FV DistalFull                                                              +---------+---------------+---------+-----------+----------+-------------------+ PFV      Full                                                             +---------+---------------+---------+-----------+----------+-------------------+ POP                     Yes      Yes                  patent by color and                                                       Doppler             +---------+---------------+---------+-----------+----------+-------------------+ PTV                                                   patent by color and                                                       Doppler             +---------+---------------+---------+-----------+----------+-------------------+ PERO                                                  Not well visualized +---------+---------------+---------+-----------+----------+-------------------+     Summary: RIGHT: - There is no evidence of deep vein thrombosis in the lower extremity. However, portions of this examination were limited- see technologist comments above.  LEFT: - There is no evidence of deep vein thrombosis in the lower extremity. However, portions of this examination were limited- see technologist comments above.  *See table(s) above for measurements and observations. Electronically signed by Fabienne Bruns MD on 09/22/2020 at 9:13:05 PM.    Final    Scheduled Meds: . chlorhexidine  15 mL Mouth Rinse BID  . Chlorhexidine Gluconate Cloth  6 each Topical Daily  . insulin aspart  0-6  Units Subcutaneous Q4H  . insulin glargine  10 Units Subcutaneous QHS  . mouth rinse  15 mL Mouth Rinse q12n4p  . metoprolol tartrate  5 mg Intravenous Q6H  . QUEtiapine  12.5 mg Oral BID   Continuous Infusions: . sodium chloride Stopped (09/19/20 1047)  . dextrose 5 % and 0.9 % NaCl with KCl 20 mEq/L 75 mL/hr at 09/23/20 1436     LOS: 6 days   Time spent:  Azucena Fallen, DO [facility]  If 7PM-7AM,  please contact night-coverage [email or web address]  09/24/2020, 7:01 AM

## 2020-09-24 NOTE — Consult Note (Signed)
[facility] Face-to-Face Psychiatry Consult   Reason for Consult:  Altered mental status  Referring Physician:  Meredeth Ide, MD Patient Identification: Donna Harrell MRN:  [id number] Principal Diagnosis: Altered mental status Diagnosis:  Principal Problem:   Altered mental status Active Problems:   DKA (diabetic ketoacidosis) (HCC)   Essential hypertension   Acute metabolic encephalopathy   Elevated troponin   Elevated CK   Elevated lactic acid level   Encephalopathy   Total Time spent with patient: 30 minutes  Subjective:   Donna Harrell is a 65 y.o. female with medical history of diabetes mellitus type 1, depression, and hypertension.  Patient presented admitted after presenting to hospital with confusion.  In ED patient found to be in DKA with altered mental status. She was started on IV insulin per DKA protocol and CT Head: unremarkable.      HPI:  Donna Harrell, 65 y.o., female patient seen face to face by this provider, consulted with Dr. Nelly Rout; and chart reviewed on 09/24/20.  On evaluation Donna Harrell reports she doesn't know why she was admitted to the hospital.  States that she lives alone and has no family close by.  Patient denies prior psychiatric history, dieting, or an eating disorder.  Patient also denies depression and anxiety.  When asked about diabetes and controlling her blood sugar patient was hesitant to answer then stated that she had no problem.  Patient gave yes or no answers to most question or a brief narrative without much elaboration.      During evaluation Alazae Crymes is slightly elevated up in bed in no acute distress.  She is alert, oriented x 3.  Patient is aware that she is in a hospital in [place], [place].  Patient was able to give her correct DOB, AGE, [place], [place], [place].  She could not give the name of the county for [place].  Patient stated that the current president was Donna Harrell but the presidents before were Bush and Bush.  Patient  remained calm and cooperative throughout assessment with minimal participation.  Her mood appeared depressed with flat affect; although she denied depression.  She does not appear to be responding to internal/external stimuli or delusional thoughts.  Patient denies suicidal/self-harm/homicidal ideation, psychosis, and paranoia.    Spoke with patients nurse who stated that it took patient a while to start corresponding with her today.  States patient started talking to her around 1:00 PM today asking for things she wanted or needed.  Patient refused medication (Seroquel) last night but she will attempt to give to patient prior to leaving today since they have developed a rapport with each other.  Patients nurse also informed that she had spoken with a sister of patient that should be in town by tomorrow.  Patient has no family in [place] all live in [place] and have plans to move patient back home.  There is a sister listed on patients emergency contact if anyone needs to contact a family member.     Past Psychiatric History: Denies prior Psychiatric history  Risk to Self:  No Risk to Others:  No Prior Inpatient Therapy:  Denies Prior Outpatient Therapy:  Denies  Past Medical History:  Past Medical History:  Diagnosis Date  . Diabetes (HCC)   . 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 Hypertension   . Legally blind    per sister  . TIA (transient  ischemic attack)    per sister    Past Surgical History:  Procedure Laterality Date  . 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 High blood pressure Mother   . High blood pressure Sister   . Stroke Sister    Family Psychiatric  History: None reported Social History:  Social History   Substance and Sexual Activity  Alcohol Use Not Currently     Social History    Substance and Sexual Activity  Drug Use Not Currently   Comment: used to use marijuana as a teenager    Social History   Socioeconomic History  . Marital status: Single    Spouse name: Not on file  . Number of children: Not on file  . Years of education: Not on file  . Highest education level: Not on file  Occupational History  . Not on file  Tobacco Use  . Smoking status: Former Games developer  . Smokeless tobacco: Never Used  . Tobacco comment: quit 20 years ago  Substance and Sexual Activity  . Alcohol use: Not Currently  . Drug use: Not Currently    Comment: used to use marijuana as a teenager  . Sexual activity: Not on file  Other Topics Concern  . Not on file  Social History Narrative  . Not on file   Social Determinants of Health   Financial Resource Strain: Not on file  Food Insecurity: Not on file  Transportation Needs: Not on file  Physical Activity: Not on file  Stress: Not on file  Social Connections: Not on file   Additional Social History:    Allergies:  Not on File  Labs:  Results for orders placed or performed during the hospital encounter of 09/17/20 (from the past 48 hour(s))  Glucose, capillary     Status: Abnormal   Collection Time: 09/22/20  5:02 PM  Result Value Ref Range   Glucose-Capillary 224 (H) 70 - 99 mg/dL    Comment: Glucose reference range applies only to samples taken after fasting for at least 8 hours.  Glucose, capillary     Status: Abnormal   Collection Time: 09/22/20  9:07 PM  Result Value Ref Range   Glucose-Capillary 281 (H) 70 - 99 mg/dL    Comment: Glucose reference range applies only to samples taken after fasting for at least 8 hours.  Glucose, capillary     Status: Abnormal   Collection Time: 09/22/20 11:53 PM  Result Value Ref Range   Glucose-Capillary 216 (H) 70 - 99 mg/dL    Comment: Glucose reference range applies only to samples taken after fasting for at least 8 hours.  CK     Status: Abnormal   Collection Time:  09/23/20  3:05 AM  Result Value Ref Range   Total CK 461 (H) 38 - 234 U/L    Comment: Performed at [facility], [address] [place], [place] [address]  Basic metabolic panel     Status: Abnormal   Collection Time: 09/23/20  3:05 AM  Result Value Ref Range   Sodium 139 135 - 145 mmol/L   Potassium 4.8 3.5 - 5.1 mmol/L    Comment: DELTA CHECK NOTED   Chloride 107 98 - 111 mmol/L   CO2 20 (L) 22 - 32 mmol/L   Glucose, Bld 210 (H) 70 - 99 mg/dL    Comment: Glucose reference range applies  only to samples taken after fasting for at least 8 hours.   BUN 8 8 - 23 mg/dL   Creatinine, Ser 4.09 0.44 - 1.00 mg/dL   Calcium 9.6 8.9 - 81.1 mg/dL   GFR, Estimated >91 >47 mL/min    Comment: (NOTE) Calculated using the CKD-EPI Creatinine Equation (2021)    Anion gap 12 5 - 15    Comment: Performed at [facility], [address], [place], [place] [address]  Glucose, capillary     Status: Abnormal   Collection Time: 09/23/20  4:01 AM  Result Value Ref Range   Glucose-Capillary 207 (H) 70 - 99 mg/dL    Comment: Glucose reference range applies only to samples taken after fasting for at least 8 hours.  Glucose, capillary     Status: Abnormal   Collection Time: 09/23/20  7:39 AM  Result Value Ref Range   Glucose-Capillary 171 (H) 70 - 99 mg/dL    Comment: Glucose reference range applies only to samples taken after fasting for at least 8 hours.  Glucose, capillary     Status: Abnormal   Collection Time: 09/23/20 11:34 AM  Result Value Ref Range   Glucose-Capillary 169 (H) 70 - 99 mg/dL    Comment: Glucose reference range applies only to samples taken after fasting for at least 8 hours.  Glucose, capillary     Status: Abnormal   Collection Time: 09/23/20  4:20 PM  Result Value Ref Range   Glucose-Capillary 184 (H) 70 - 99 mg/dL    Comment: Glucose reference range applies only to samples taken after fasting for at least 8 hours.  Glucose, capillary      Status: Abnormal   Collection Time: 09/23/20  9:18 PM  Result Value Ref Range   Glucose-Capillary 288 (H) 70 - 99 mg/dL    Comment: Glucose reference range applies only to samples taken after fasting for at least 8 hours.  Glucose, capillary     Status: Abnormal   Collection Time: 09/24/20 12:04 AM  Result Value Ref Range   Glucose-Capillary 221 (H) 70 - 99 mg/dL    Comment: Glucose reference range applies only to samples taken after fasting for at least 8 hours.  Basic metabolic panel     Status: Abnormal   Collection Time: 09/24/20  2:49 AM  Result Value Ref Range   Sodium 138 135 - 145 mmol/L   Potassium 3.4 (L) 3.5 - 5.1 mmol/L    Comment: DELTA CHECK NOTED   Chloride 106 98 - 111 mmol/L   CO2 23 22 - 32 mmol/L   Glucose, Bld 114 (H) 70 - 99 mg/dL    Comment: Glucose reference range applies only to samples taken after fasting for at least 8 hours.   BUN 5 (L) 8 - 23 mg/dL   Creatinine, Ser 2.13 0.44 - 1.00 mg/dL   Calcium 9.1 8.9 - 08.6 mg/dL   GFR, Estimated >57 >84 mL/min    Comment: (NOTE) Calculated using the CKD-EPI Creatinine Equation (2021)    Anion gap 9 5 - 15    Comment: Performed at [facility], [address], [place], [place] [address]  Glucose, capillary     Status: None   Collection Time: 09/24/20  4:50 AM  Result Value Ref Range   Glucose-Capillary 71 70 - 99 mg/dL    Comment: Glucose reference range applies only to samples taken after fasting for at least 8 hours.  Glucose, capillary     Status:  None   Collection Time: 09/24/20  7:32 AM  Result Value Ref Range   Glucose-Capillary 75 70 - 99 mg/dL    Comment: Glucose reference range applies only to samples taken after fasting for at least 8 hours.   Comment 1 Notify RN    Comment 2 Document in Chart   Glucose, capillary     Status: None   Collection Time: 09/24/20 12:01 PM  Result Value Ref Range   Glucose-Capillary 84 70 - 99 mg/dL    Comment: Glucose reference range applies only  to samples taken after fasting for at least 8 hours.   Comment 1 Notify RN    Comment 2 Document in Chart     Current Facility-Administered Medications  Medication Dose Route Frequency Provider Last Rate Last Admin  . 0.9 %  sodium chloride infusion   Intravenous PRN Meredeth IdeLama, Gagan S, MD   Stopped at 09/19/20 1047  . acetaminophen (TYLENOL) suppository 650 mg  650 mg Rectal Q6H PRN Meredeth IdeLama, Gagan S, MD   650 mg at 09/18/20 1253  . chlorhexidine (PERIDEX) 0.12 % solution 15 mL  15 mL Mouth Rinse BID Doutova, Anastassia, MD   15 mL at 09/24/20 1056  . Chlorhexidine Gluconate Cloth 2 % PADS 6 each  6 each Topical Daily Therisa Doyneoutova, Anastassia, MD   6 each at 09/24/20 1442  . dextrose 5 % and 0.9 % NaCl with KCl 20 mEq/L infusion   Intravenous Continuous Meredeth IdeLama, Gagan S, MD 75 mL/hr at 09/24/20 1019 New Bag at 09/24/20 1019  . dextrose 50 % solution 0-50 mL  0-50 mL Intravenous PRN Therisa Doyneoutova, Anastassia, MD   50 mL at 09/22/20 0330  . hydrALAZINE (APRESOLINE) injection 10 mg  10 mg Intravenous Q4H PRN Meredeth IdeLama, Gagan S, MD   10 mg at 09/23/20 0316  . insulin aspart (novoLOG) injection 0-6 Units  0-6 Units Subcutaneous Q4H Meredeth IdeLama, Gagan S, MD   2 Units at 09/24/20 0015  . insulin glargine (LANTUS) injection 10 Units  10 Units Subcutaneous QHS Meredeth IdeLama, Gagan S, MD   10 Units at 09/23/20 2118  . MEDLINE mouth rinse  15 mL Mouth Rinse q12n4p Doutova, Anastassia, MD   15 mL at 09/24/20 1137  . metoprolol tartrate (LOPRESSOR) injection 5 mg  5 mg Intravenous Q6H Manuela SchwartzMorrison, Brenda, NP   5 mg at 09/24/20 1137  . QUEtiapine (SEROQUEL) tablet 12.5 mg  12.5 mg Oral BID Maryagnes AmosStarkes-Perry, Takia S, FNP   12.5 mg at 09/23/20 2122  . sodium chloride flush (NS) 0.9 % injection 10-40 mL  10-40 mL Intracatheter Q12H Azucena FallenLancaster, William C, MD   10 mL at 09/24/20 1442  . sodium chloride flush (NS) 0.9 % injection 10-40 mL  10-40 mL Intracatheter PRN Azucena FallenLancaster, William C, MD        Musculoskeletal: Strength & Muscle Tone: within normal  limits Gait & Station: Did not see patient ambulate Patient leans: N/A    Psychiatric Specialty Exam: Psychiatric Specialty Exam: Physical Exam Vitals and nursing note reviewed.  Constitutional:      General: She is not in acute distress. HENT:     Head: Normocephalic.  Cardiovascular:     Rate and Rhythm: Normal rate.     Comments: Elevated blood pressure  Pulmonary:     Effort: Pulmonary effort is normal.  Musculoskeletal:        General: Normal range of motion.  Skin:    General: Skin is warm and dry.  Neurological:     Mental Status:  She is alert and oriented to person, place, and time.  Psychiatric:        Attention and Perception: Attention and perception normal. She does not perceive auditory or visual hallucinations.        Mood and Affect: Mood is depressed. Affect is flat.        Speech: Speech normal.        Behavior: Behavior is withdrawn. Behavior is cooperative.        Thought Content: Thought content normal. Thought content is not paranoid. Thought content does not include homicidal or suicidal ideation.        Cognition and Memory: Cognition and memory normal.        Judgment: Judgment normal.      Review of Systems  Unable to perform ROS: Other  Psychiatric/Behavioral: Depression: Denies. Hallucinations: Denies. Substance abuse: Denies. Suicidal ideas: Denies. Nervous/anxious: Denies. Insomnia: Denies.        Patient answering "No" to most question or just a brief narrative sentence with no elaboration.  Patient does state that she doesn't have an eating disorder and has no prior psychiatric history.      Blood pressure (!) 175/121, pulse 77, temperature (!) 97.4 F (36.3 C), temperature source Oral, resp. rate 15, height  (1.651 m), weight 65.8 kg, SpO2 100 %.Body mass index is 24.13 kg/m.  General Appearance: Casual  In hospital gown  Eye Contact:  Fair  Speech:  Clear and Coherent and Normal Rate  Volume:  Normal  Mood:  Depressed  Affect:   Flat  Thought Process:  Coherent, Linear and Descriptions of Associations: Intact  Orientation:  Full (Time, Place, and Person)  Thought Content:  WDL  Suicidal Thoughts:  No  Homicidal Thoughts:  No  Memory:  Immediate;   Fair Recent;   Fair  Judgement:  Fair  Insight:  Fair and Present  Psychomotor Activity:  Normal  Concentration:  Concentration: Good and Attention Span: Good  Recall:  [employer] of Knowledge:  Difficult to assess during this assessment with minimal participation.  Language:  Good  Akathisia:  No  Handed:  Right  AIMS (if indicated):     Assets:  Desire for Improvement Housing Resilience Social Support  ADL's:  Intact  Requiring assistance at this time  Cognition:  WNL  Sleep:      Physical Exam: Physical Exam Vitals and nursing note reviewed.  Constitutional:      General: She is not in acute distress. HENT:     Head: Normocephalic.  Cardiovascular:     Rate and Rhythm: Normal rate.     Comments: Elevated blood pressure  Pulmonary:     Effort: Pulmonary effort is normal.  Musculoskeletal:        General: Normal range of motion.  Skin:    General: Skin is warm and dry.  Neurological:     Mental Status: She is alert and oriented to person, place, and time.  Psychiatric:        Attention and Perception: Attention and perception normal. She does not perceive auditory or visual hallucinations.        Mood and Affect: Mood is depressed. Affect is flat.        Speech: Speech normal.        Behavior: Behavior is withdrawn. Behavior is cooperative.        Thought Content: Thought content normal. Thought content is not paranoid. Thought content does not include homicidal or suicidal ideation.  Cognition and Memory: Cognition and memory normal.        Judgment: Judgment normal.    Review of Systems  Unable to perform ROS: Other  Psychiatric/Behavioral: Depression: Denies. Hallucinations: Denies. Substance abuse: Denies. Suicidal ideas: Denies.  Nervous/anxious: Denies. Insomnia: Denies.        Patient answering "No" to most question or just a brief narrative sentence with no elaboration.  Patient does state that she doesn't have an eating disorder and has no prior psychiatric history.     Blood pressure (!) 175/121, pulse 77, temperature (!) 97.4 F (36.3 C), temperature source Oral, resp. rate 15, height 5\' 5"  (1.651 m), weight 65.8 kg, SpO2 100 %. Body mass index is 24.13 kg/m.  Treatment Plan Summary: Plan Hospitalist to reorder psychiatric consult if follow up needed.  At this time patient doesn't require psychiatric hospitalization, not meeting criteria related to denial of suicidal/homicidal ideation, psychosis, and paranoia. Patient also denies prior psychiatric history.  Patient also denied history of eating disorder.  There was minimal participation by patient during assessment.  After family visits if feel or new information gained that supports psychiatric findings will be glad to follow up with patient.     Medication Management:  Continue the following Ativan 1 mg for aggression and agitation and  Seroquel 12.5 mg Bid for encephalopathy symptoms  Disposition: No evidence of imminent risk to self or others at present.   Patient does not meet criteria for psychiatric inpatient admission.   Secure message sent to Dr. informing:    Carma Leaven, NP 09/24/2020 4:08 PM

## 2020-09-24 NOTE — Progress Notes (Signed)
Pt being physically aggressive with staff members, attempting to climb out of bed, and removing all monitoring equipment. IV ativan ordered, but pt removed her only IV.  IV team consulted to place a new IV.

## 2020-09-24 NOTE — Progress Notes (Signed)
Inpatient Diabetes Program Recommendations  AACE/ADA: New Consensus Statement on Inpatient Glycemic Control (2015)  Target Ranges:  Prepandial:   less than 140 mg/dL      Peak postprandial:   less than 180 mg/dL (1-2 hours)      Critically ill patients:  140 - 180 mg/dL   Lab Results  Component Value Date   GLUCAP 75 09/24/2020   HGBA1C 7.1 (H) 09/19/2020    Review of Glycemic Control Results for Donna Harrell, Donna Harrell (MRN [id number]) as of 09/24/2020 10:12  Ref. Range 09/23/2020 07:39 09/23/2020 11:34 09/23/2020 16:20 09/23/2020 21:18 09/24/2020 04:50 09/24/2020 07:32  Glucose-Capillary Latest Ref Range: 70 - 99 mg/dL 037 (H) Novolog 1 unit 169 (H) Novolog 1 unit 184 (H) Novolog 1 unit 288 (H) Novolog 5 units total 71 75   Inpatient Diabetes Program Recommendations:   Consider: Decrease Novolog correction to qid with meals  Thank you, Donna Harrell. Hanks, RN, MSN, CDE  Diabetes Coordinator Inpatient Glycemic Control Team Team Pager [phone] (8am-5pm) 09/24/2020 10:13 AM

## 2020-09-24 NOTE — Progress Notes (Signed)
     Referral received for [facility] :goals of care discussion. Chart reviewed and updates received from RN. Patient assessed and unwilling to engage in conversations not just with myself but also nursing staff. IV team @ bedside attempting to start IV.   Psych has seen patient and started on Seroquel and ativan. Patient's sister lives in [place] and planning to visit in the upcoming days. Would be more open to conversations once aware of patient's overall condition.    Discussed with Dr. Natale Milch. Palliative will continue to follow and engage when appropriate.   Thank you for your referral and allowing PMT to assist in Ms. Donna Harrell's care.   Willette Alma, AGPCNP-BC Palliative Medicine Team  Phone: [phone] Amion: N. Cousar   NO CHARGE

## 2020-09-25 DIAGNOSIS — E1010 Type 1 diabetes mellitus with ketoacidosis without coma: Secondary | ICD-10-CM | POA: Diagnosis not present

## 2020-09-25 DIAGNOSIS — R7989 Other specified abnormal findings of blood chemistry: Secondary | ICD-10-CM | POA: Diagnosis not present

## 2020-09-25 DIAGNOSIS — R748 Abnormal levels of other serum enzymes: Secondary | ICD-10-CM | POA: Diagnosis not present

## 2020-09-25 DIAGNOSIS — R778 Other specified abnormalities of plasma proteins: Secondary | ICD-10-CM | POA: Diagnosis not present

## 2020-09-25 LAB — GLUCOSE, CAPILLARY
Glucose-Capillary: 106 mg/dL — ABNORMAL HIGH (ref 70–99)
Glucose-Capillary: 163 mg/dL — ABNORMAL HIGH (ref 70–99)
Glucose-Capillary: 168 mg/dL — ABNORMAL HIGH (ref 70–99)
Glucose-Capillary: 255 mg/dL — ABNORMAL HIGH (ref 70–99)
Glucose-Capillary: 402 mg/dL — ABNORMAL HIGH (ref 70–99)
Glucose-Capillary: 89 mg/dL (ref 70–99)

## 2020-09-25 LAB — CBC
HCT: 38.2 % (ref 36.0–46.0)
Hemoglobin: 11.9 g/dL — ABNORMAL LOW (ref 12.0–15.0)
MCH: 25.1 pg — ABNORMAL LOW (ref 26.0–34.0)
MCHC: 31.2 g/dL (ref 30.0–36.0)
MCV: 80.4 fL (ref 80.0–100.0)
Platelets: 160 10*3/uL (ref 150–400)
RBC: 4.75 MIL/uL (ref 3.87–5.11)
RDW: 14.7 % (ref 11.5–15.5)
WBC: 7.6 10*3/uL (ref 4.0–10.5)
nRBC: 0 % (ref 0.0–0.2)

## 2020-09-25 LAB — COMPREHENSIVE METABOLIC PANEL
ALT: 23 U/L (ref 0–44)
ALT: 24 U/L (ref 0–44)
AST: 54 U/L — ABNORMAL HIGH (ref 15–41)
AST: 22 U/L (ref 15–41)
Albumin: 3.3 g/dL — ABNORMAL LOW (ref 3.5–5.0)
Albumin: 3.3 g/dL — ABNORMAL LOW (ref 3.5–5.0)
Alkaline Phosphatase: 62 U/L (ref 38–126)
Alkaline Phosphatase: 64 U/L (ref 38–126)
Anion gap: 7 (ref 5–15)
Anion gap: 9 (ref 5–15)
BUN: 6 mg/dL — ABNORMAL LOW (ref 8–23)
BUN: 7 mg/dL — ABNORMAL LOW (ref 8–23)
CO2: 26 mmol/L (ref 22–32)
CO2: 24 mmol/L (ref 22–32)
Calcium: 9.1 mg/dL (ref 8.9–10.3)
Calcium: 9 mg/dL (ref 8.9–10.3)
Chloride: 106 mmol/L (ref 98–111)
Chloride: 105 mmol/L (ref 98–111)
Creatinine, Ser: 0.78 mg/dL (ref 0.44–1.00)
Creatinine, Ser: 0.61 mg/dL (ref 0.44–1.00)
GFR, Estimated: >60 mL/min (ref >60)
GFR, Estimated: >60 mL/min (ref >60)
Glucose, Bld: 124 mg/dL — ABNORMAL HIGH (ref 70–99)
Glucose, Bld: 119 mg/dL — ABNORMAL HIGH (ref 70–99)
Potassium: 6.2 mmol/L — ABNORMAL HIGH (ref 3.5–5.1)
Potassium: 3.5 mmol/L (ref 3.5–5.1)
Sodium: 139 mmol/L (ref 135–145)
Sodium: 138 mmol/L (ref 135–145)
Total Bilirubin: 1.8 mg/dL — ABNORMAL HIGH (ref 0.3–1.2)
Total Bilirubin: 0.7 mg/dL (ref 0.3–1.2)
Total Protein: 6.3 g/dL — ABNORMAL LOW (ref 6.5–8.1)
Total Protein: 6.2 g/dL — ABNORMAL LOW (ref 6.5–8.1)

## 2020-09-25 MED ORDER — PROSOURCE PLUS PO LIQD
30.0000 mL | Freq: Every day | ORAL | Status: DC
  Administered 2020-09-26: 30 mL via ORAL
  Filled 2020-09-25: qty 30

## 2020-09-25 MED ORDER — GLUCERNA SHAKE PO LIQD
237.0000 mL | Freq: Two times a day (BID) | ORAL | Status: DC
  Administered 2020-09-27 – 2020-09-29 (×3): 237 mL via ORAL
  Filled 2020-09-25 (×11): qty 237

## 2020-09-25 NOTE — Progress Notes (Signed)
PT Cancellation Note  Patient Details Name: Donna Harrell MRN: [id number] DOB: 11/09/1955   Cancelled Treatment:    Reason Eval/Treat Not Completed: Patient's level of consciousness, patient in restraints, did not arouse when spoken to, will check back another time when MS improved and can participate.    Rada Hay 09/25/2020, 2:42 PM Blanchard Kelch PT Acute Rehabilitation Services Pager [phone] Office [phone]

## 2020-09-25 NOTE — Progress Notes (Signed)
SLP Cancellation Note  Patient Details Name: Laiklynn Raczynski MRN: [id number] DOB: [birth year]   Cancelled treatment:       Reason Eval/Treat Not Completed: Patient's level of consciousness. Pt poorly responsive at this time. Lunch tray removed from pt reach for safety. Will continue efforts to assess diet tolerance and provide education.  Celia B. Murvin Natal, [facility], CCC-SLP Speech Language Pathologist Office: [phone] Pager: [phone]  Leigh Aurora 09/25/2020, 3:06 PM

## 2020-09-25 NOTE — Progress Notes (Signed)
Pt refused most care AM shift despite repeated efforts by RN and NT. Pt was cleaned up and bathed and pads changed. Pt refused purewick. Pt refused to take meds. Would talk a few sentences to staff then refuse to participate in care. Pt very strong and was pulling at restraints. Pt refused to eat meals or drink fluids, was offered them multiple times.   Pt's friend came by to see her. Dinner was ordered and she did eat all of it. Pt also drank glucerna shake. Pt cooperative while friend was there visiting.

## 2020-09-25 NOTE — Progress Notes (Addendum)
PROGRESS NOTE    Donna AlarCheryl Harrell  ZOX:[id number] DOB: January 25, 1956 DOA: 09/17/2020 PCP: Pcp, No  Brief Narrative:  65 year old female with medical history of diabetes mellitus type 1, depression, hypertension, was presented with confusion. Patient lives alone per family and has not been consistent taking her insulin. Also patient has been very insistent on losing diet and has been eating lettuce and shame. She has had multiple episodes of hypoglycemia as per sister and many times EMS has been called for that. She is also not compliant with taking insulin. When EMS was called to home she was found covered in feces and urine. As per patient's sister for past 2 days they were unable to contact her. In the ED patient was found to be in DKA with continued altered mental status. DKA resolved with appropriate treatment now transitioned off IV insulin. Patient has history of poor p.o. intake. She has been having multiple episodes of hypoglycemia as above. In the hospital patient has been refusing to eat. Psychiatric consultation has been obtained. Will also obtain palliative care consultation for further clarification of goals of care.  Assessment & Plan:   Principal Problem:   Altered mental status Active Problems:   DKA (diabetic ketoacidosis) (HCC)   Essential hypertension   Acute metabolic encephalopathy   Elevated troponin   Elevated CK   Elevated lactic acid level   Encephalopathy   Metabolic encephalopathy, POA, improving Concurrent hospital delirium ongoing -Mental status improving somewhat over the past 48 hours, more interactive and appropriate this morning, still somewhat combative and confused overnight consistent with delirium - Initially thought due to metabolic disturbances but continues despite resolution of DKA  - Psychiatry consulted for further insight and treatment -continue Seroquel - Profound hypoglycemia previously thought to be causing patient's mental status but again  hypoglycemia resolved without resolution of symptoms - CT head, MRI and EEG all unremarkable for any acute findings or epileptiform activity - DC all sedating medication at this time; if combative would prefer physical restraints or bedside sitter rather than medications - Patient has completed high-dose IV thiamine; alcohol level within normal limits at intake - Ammonia level is 11, HIV nonreactive, B12 level 374, TSH 2.478    Failure to thrive - severe protein caloric malnutrition - Unclear etiology, appears to have intentionally been trying to lose weight in the outpatient setting per family but mental status now prohibiting patient's interaction and unable to safely feed at this time  - Family indicates patient was having multiple episodes of hypoglycemia at home.  - Questionably underlying behavioral disorder, psychiatry has cleared for further outpatient evaluation - Palliative care for further care discussion of goals of care.  Diabetic ketoacidosis-resolved.  - Transitioned to clear diet but extremely poor p.o. intake and has been refusing to take p.o. making transition to insulin difficult.  Hypertension- - Continue hydralazine 10 mg IV every 4 hours as needed.  Hypokalemia - Repleted  Elevated troponin - Without ST elevation; Troponin peak at 600, Cardiology previously consulted, no intervention recommended at this time.  Elevated CK/rhabdomyolysis - Likely from being on the floor for 2 days. Patient presented with CK of 2316. Started on rehydration as above for DKA. CK is now down to 461. Continue IV normal saline.    Anion gap metabolic acidosis  - Secondary to lactic acid acidosis and concurrent DKA  Hyperdense right globe - Likely from surgery in the past.   - Patient's right eyeball was found to be hyperdense on CT head. I called and  discussed with patient's sister who says that she had 2 surgeries on right eye but her vision could not be restored.  Bilateral leg  swelling - Venous duplex of lower extremities was negative for DVT bilaterally.  Parotid gland lesion, incidental finding - MRI shows 1.6 cm lesion within the superficial lobe of the left parotid gland, partially visualized. Could be artifact versus primary neoplasm.  - Dedicated CT soft tissue neck with contrast suggested when patient is more able to tolerate - will likely need to be an outpatient workup at this point.  DVT prophylaxis: SCDs, early ambulation Code Status: Full Family Communication: updated sister at length over the phone -family planning to visit this weekend and will likely try to take the patient back to [place], unclear if patient has capacity at this point to make decisions, family is going to attempt to obtain medical power of attorney in [place] with patient's previous PCP who knows the patient quite well.  Status is: Inpt  Dispo: The patient is from: Home              Anticipated d/c is to: TBD              Anticipated d/c date is: 24 to 48 hours              Patient currently NOT medically stable for discharge  Consultants:   Psych, palliative, PCCM  Procedures:   None  Antimicrobials:  No indication  Subjective: Overnight patient became somewhat combative was able to get out of restraints and pull out midline, IV has been replaced but patient otherwise calm this morning review of systems remains limited but patient is much more awake and alert this morning  Objective: Vitals:   09/25/20 0300 09/25/20 0400 09/25/20 0500 09/25/20 0616  BP: (!) 120/92  (!) 127/48 (!) 164/72  Pulse: 76  77 82  Resp: 14  11 19   Temp:  97.6 F (36.4 C)    TempSrc:  Oral    SpO2: 100%  99% 99%  Weight:      Height:        Intake/Output Summary (Last 24 hours) at 09/25/2020 0742 Last data filed at 09/24/2020 2104 Gross per 24 hour  Intake 2068.14 ml  Output 1700 ml  Net 368.14 ml   Filed Weights   09/17/20 1856  Weight: 65.8 kg    Examination:  General:   Pleasantly resting in bed, No acute distress.  Verbally responsive this morning alert to person place HEENT:  Normocephalic atraumatic.  Sclerae nonicteric, noninjected.  Extraocular movements intact bilaterally. Neck:  Without mass or deformity.  Trachea is midline. Lungs:  Clear to auscultate bilaterally without rhonchi, wheeze, or rales. Heart:  Regular rate and rhythm.  Without murmurs, rubs, or gallops. Abdomen:  Soft, nontender, nondistended.  Without guarding or rebound. Extremities: Without cyanosis, clubbing, edema, or obvious deformity. Vascular:  Dorsalis pedis and posterior tibial pulses palpable bilaterally. Skin:  Warm and dry, no erythema, no ulcerations.   Data Reviewed: I have personally reviewed following labs and imaging studies  CBC: Recent Labs  Lab 09/19/20 0217 09/22/20 0354 09/25/20 0257  WBC 14.8* 7.9 7.6  HGB 12.4 12.8 11.9*  HCT 40.4 41.2 38.2  MCV 81.6 81.7 80.4  PLT 167 177 160   Basic Metabolic Panel: Recent Labs  Lab 09/22/20 0354 09/23/20 0305 09/24/20 0249 09/25/20 0257 09/25/20 0409  NA 137 139 138 139 138  K 3.4* 4.8 3.4* 6.2* 3.5  CL 103 107  106 106 105  CO2 28 20* 23 26 24   GLUCOSE 188* 210* 114* 124* 119*  BUN 7* 8 5* 6* 7*  CREATININE 0.57 0.70 0.55 0.78 0.61  CALCIUM 9.2 9.6 9.1 9.1 9.0   GFR: Estimated Creatinine Clearance: 65.6 mL/min (by C-G formula based on SCr of 0.61 mg/dL). Liver Function Tests: Recent Labs  Lab 09/19/20 0217 09/22/20 0354 09/25/20 0257 09/25/20 0409  AST 70* 55* 54* 22  ALT 32 36 23 24  ALKPHOS 58 64 62 64  BILITOT 0.9 0.7 1.8* 0.7  PROT 6.2* 6.2* 6.3* 6.2*  ALBUMIN 3.4* 3.3* 3.3* 3.3*   No results for input(s): LIPASE, AMYLASE in the last 168 hours. No results for input(s): AMMONIA in the last 168 hours. Coagulation Profile: No results for input(s): INR, PROTIME in the last 168 hours. Cardiac Enzymes: Recent Labs  Lab 09/19/20 1123 09/20/20 0311 09/21/20 0825 09/22/20 0354  09/23/20 0305  CKTOTAL 2,089* 2,316* 1,631* 899* 461*   BNP (last 3 results) No results for input(s): PROBNP in the last 8760 hours. HbA1C: No results for input(s): HGBA1C in the last 72 hours. CBG: Recent Labs  Lab 09/24/20 1201 09/24/20 1703 09/24/20 2101 09/25/20 0039 09/25/20 0416  GLUCAP 84 300* 306* 168* 106*   Lipid Profile: No results for input(s): CHOL, HDL, LDLCALC, TRIG, CHOLHDL, LDLDIRECT in the last 72 hours. Thyroid Function Tests: No results for input(s): TSH, T4TOTAL, FREET4, T3FREE, THYROIDAB in the last 72 hours. Anemia Panel: No results for input(s): VITAMINB12, FOLATE, FERRITIN, TIBC, IRON, RETICCTPCT in the last 72 hours. Sepsis Labs: Recent Labs  Lab 09/18/20 0757  LATICACIDVEN 1.1    Recent Results (from the past 240 hour(s))  Urine culture     Status: Abnormal   Collection Time: 09/17/20  7:32 PM   Specimen: Urine, Random  Result Value Ref Range Status   Specimen Description   Final    URINE, RANDOM Performed at [facility], [address] [place], [place]    Special Requests   Final    NONE Performed at [facility], [address] [place], [place]    Culture MULTIPLE SPECIES PRESENT, SUGGEST RECOLLECTION (A)  Final   Report Status 09/19/2020 FINAL  Final  Resp Panel by RT-PCR (Flu A&B, Covid) Nasopharyngeal Swab     Status: None   Collection Time: 09/17/20  8:45 PM   Specimen: Nasopharyngeal Swab; Nasopharyngeal(NP) swabs in vial transport medium  Result Value Ref Range Status   SARS Coronavirus 2 by RT PCR NEGATIVE NEGATIVE Final    Comment: (NOTE) SARS-CoV-2 target nucleic acids are NOT DETECTED.  The SARS-CoV-2 RNA is generally detectable in upper respiratory specimens during the acute phase of infection. The lowest concentration of SARS-CoV-2 viral copies this assay can detect is 138 copies/mL. A negative result does not preclude SARS-Cov-2 infection and should not be  used as the sole basis for treatment or other patient management decisions. A negative result may occur with  improper specimen collection/handling, submission of specimen other than nasopharyngeal swab, presence of viral mutation(s) within the areas targeted by this assay, and inadequate number of viral copies(<138 copies/mL). A negative result must be combined with clinical observations, patient history, and epidemiological information. The expected result is Negative.  Fact Sheet for Patients:  11/17/20  Fact Sheet for Healthcare Providers:  BloggerCourse.com  This test is no t yet approved or cleared by the [employer] FDA and  has been authorized for detection and/or diagnosis  of SARS-CoV-2 by FDA under an Emergency Use Authorization (EUA). This EUA will remain  in effect (meaning this test can be used) for the duration of the COVID-19 declaration under Section 564(b)(1) of the Act, 21 U.S.C.section 360bbb-3(b)(1), unless the authorization is terminated  or revoked sooner.       Influenza A by PCR NEGATIVE NEGATIVE Final   Influenza B by PCR NEGATIVE NEGATIVE Final    Comment: (NOTE) The Xpert Xpress SARS-CoV-2/FLU/RSV plus assay is intended as an aid in the diagnosis of influenza from Nasopharyngeal swab specimens and should not be used as a sole basis for treatment. Nasal washings and aspirates are unacceptable for Xpert Xpress SARS-CoV-2/FLU/RSV testing.  Fact Sheet for Patients: BloggerCourse.com  Fact Sheet for Healthcare Providers: SeriousBroker.it  This test is not yet approved or cleared by the [place] FDA and has been authorized for detection and/or diagnosis of SARS-CoV-2 by FDA under an Emergency Use Authorization (EUA). This EUA will remain in effect (meaning this test can be used) for the duration of the COVID-19 declaration under Section  564(b)(1) of the Act, 21 U.S.C. section 360bbb-3(b)(1), unless the authorization is terminated or revoked.  Performed at [facility], [address] [place], [place] [address]   MRSA PCR Screening     Status: None   Collection Time: 09/18/20 12:21 AM   Specimen: Nasopharyngeal  Result Value Ref Range Status   MRSA by PCR NEGATIVE NEGATIVE Final    Comment:        The GeneXpert MRSA Assay (FDA approved for NASAL specimens only), is one component of a comprehensive MRSA colonization surveillance program. It is not intended to diagnose MRSA infection nor to guide or monitor treatment for MRSA infections. Performed at [facility], [address], [place], [place] [address]          Radiology Studies: No results found. Scheduled Meds: . chlorhexidine  15 mL Mouth Rinse BID  . Chlorhexidine Gluconate Cloth  6 each Topical Daily  . insulin aspart  0-6 Units Subcutaneous Q4H  . insulin glargine  10 Units Subcutaneous QHS  . mouth rinse  15 mL Mouth Rinse q12n4p  . metoprolol tartrate  5 mg Intravenous Q6H  . QUEtiapine  12.5 mg Oral BID  . sodium chloride flush  10-40 mL Intracatheter Q12H   Continuous Infusions: . sodium chloride Stopped (09/19/20 1047)  . dextrose 5 % and 0.9 % NaCl with KCl 20 mEq/L Stopped (09/24/20 1734)     LOS: 7 days   Time spent:  Azucena Fallen, DO [facility]  If 7PM-7AM, please contact night-coverage [email or web address]  09/25/2020, 7:42 AM

## 2020-09-25 NOTE — Progress Notes (Signed)
Pt with increased blood glucose. See flow sheet for details. Also refusing night medications and care. Linton Flemings, NP notified and made aware. See orders for details. Will continue to monitor patient.

## 2020-09-25 NOTE — Progress Notes (Signed)
Nutrition Follow-up  DOCUMENTATION CODES:   Not applicable  INTERVENTION:  - will order Glucerna Shake BID, each supplement provides 220 kcal and 10 grams of protein. - will order 30 ml Prosource Plus once/day, each supplement provides 100 kcal and 15 grams protein.   NUTRITION DIAGNOSIS:   Inadequate oral intake related to decreased appetite as evidenced by per patient/family report. -revised, improving  GOAL:   Patient will meet greater than or equal to 90% of their needs -unmet at this time  MONITOR:   PO intake,Supplement acceptance,Labs,Weight trends,Skin  ASSESSMENT:   65 year old female with medical history of type 1 DM, depression, and HTN. She presented to the ED with confusion. Her family reported that she lives alone. She was found down at the time of a wellness check and was found covered in urine and feces. Her family reported that she has been eating poorly and mainly consuming shrimp and lettuce. She moved to [place] in 06/2020. She was admitted for DKA and AMS. CT head was unremarkable.  Diet advanced from NPO to CLD on 3/5 at 1335 and to Carb Modified, thin liquids on 3/8 at 1715. The only documented intakes were 75% of lunch and 50% of dinner yesterday.  Patient was able to eat ~50% of breakfast this AM without issue. No abdominal pain or nausea with or after the meal.   She has not been weighed since admission on 3/2. Non-pitting edema to BLE documented in the edema section of flow sheet.    Labs reviewed; CBGs: 168, 106, 89, 163 mg/dl, BUN: 7 mg/dl.  Medications reviewed; sliding scale novolog, 10 units lantus/day. IVF; D5-NS-20 mEq KCl @ 75 ml/hr (306 kcal/24 hours).   Diet Order:   Diet Order            Diet Carb Modified Fluid consistency: Thin; Room service appropriate? Yes  Diet effective now                 EDUCATION NEEDS:   Not appropriate for education at this time  Skin:  Skin Assessment: Reviewed RN Assessment  Last BM:   PTA/unknown  Height:   Ht Readings from Last 1 Encounters:  09/17/20 5\' 5"  (1.651 m)    Weight:   Wt Readings from Last 1 Encounters:  09/17/20 65.8 kg     Estimated Nutritional Needs:  Kcal:  1900-2100 kcal Protein:  85-100 grams Fluid:  >/= 2.2 L/day      11/17/20, MS, RD, LDN, CNSC Inpatient Clinical Dietitian RD pager # available in AMION  After hours/weekend pager # available in [facility]

## 2020-09-26 DIAGNOSIS — E1010 Type 1 diabetes mellitus with ketoacidosis without coma: Secondary | ICD-10-CM | POA: Diagnosis not present

## 2020-09-26 DIAGNOSIS — R7989 Other specified abnormal findings of blood chemistry: Secondary | ICD-10-CM | POA: Diagnosis not present

## 2020-09-26 DIAGNOSIS — R748 Abnormal levels of other serum enzymes: Secondary | ICD-10-CM | POA: Diagnosis not present

## 2020-09-26 DIAGNOSIS — R4182 Altered mental status, unspecified: Secondary | ICD-10-CM | POA: Diagnosis not present

## 2020-09-26 LAB — GLUCOSE, CAPILLARY
Glucose-Capillary: 122 mg/dL — ABNORMAL HIGH (ref 70–99)
Glucose-Capillary: 191 mg/dL — ABNORMAL HIGH (ref 70–99)
Glucose-Capillary: 192 mg/dL — ABNORMAL HIGH (ref 70–99)
Glucose-Capillary: 243 mg/dL — ABNORMAL HIGH (ref 70–99)
Glucose-Capillary: 274 mg/dL — ABNORMAL HIGH (ref 70–99)
Glucose-Capillary: 372 mg/dL — ABNORMAL HIGH (ref 70–99)
Glucose-Capillary: 428 mg/dL — ABNORMAL HIGH (ref 70–99)

## 2020-09-26 MED ORDER — AMLODIPINE BESYLATE 5 MG PO TABS
5.0000 mg | ORAL_TABLET | Freq: Every day | ORAL | Status: DC
Start: 2020-09-26 — End: 2020-10-01
  Administered 2020-09-26 – 2020-09-29 (×3): 5 mg via ORAL
  Filled 2020-09-26 (×6): qty 1

## 2020-09-26 NOTE — Progress Notes (Signed)
Physical Therapy Treatment Patient Details Name: Donna Harrell MRN: [id number] DOB: 1955-10-23 Today's Date: 09/26/2020    History of Present Illness Pt is 65 yo female admitted with DKA, AMS, metabolic encephalopathy/delirium.  Pt was agitated and aggressive with staff on 09/22/20, in restraints but still required Ativan.  Pt with PMH of diabetes mellitus type 1, depression, hypertension    PT Comments    Patient awake, no responding to questions. Patient impulsive but not aggressive. Patient ambulated to BR and in hall x 200', requiring mod assistnace for balance. Patient staggering, weaving. Patient currently will require 24/7 assistnace.   Follow Up Recommendations  SNF;Other (comment) (family coming and may take pt home)     Equipment Recommendations   (unsure)    Recommendations for Other Services       Precautions / Restrictions Precautions Precautions: Fall Precaution Comments: can be aggressive, hittig Restrictions Weight Bearing Restrictions: No    Mobility  Bed Mobility   Bed Mobility: Supine to Sit;Sit to Supine     Supine to sit: Min guard Sit to supine: Min guard   General bed mobility comments: for safety, once restraints removed.    Transfers Overall transfer level: Needs assistance Equipment used: 2 person hand held assist Transfers: Sit to/from Stand Sit to Stand: Min assist         General transfer comment: steady assist to rise from bed with Hand hold, used rail from toilet  Ambulation/Gait Ambulation/Gait assistance: Mod assist;+2 physical assistance Gait Distance (Feet): 200 Feet   Gait Pattern/deviations: Staggering right;Staggering left Gait velocity: decr   General Gait Details: multiple balance losses, staggers, requires assistanmce to maintain balance,   Stairs             Wheelchair Mobility    Modified Rankin (Stroke Patients Only)       Balance Overall balance assessment: Needs assistance Sitting-balance  support: Bilateral upper extremity supported;Feet supported Sitting balance-Leahy Scale: Poor Sitting balance - Comments: leans forward , trunk not stable, impulsive   Standing balance support: Single extremity supported;During functional activity Standing balance-Leahy Scale: Poor Standing balance comment: Requires assistance for balance while standing to wash hands                            Cognition Arousal/Alertness: Awake/alert Behavior During Therapy: Anxious;Impulsive Overall Cognitive Status: No family/caregiver present to determine baseline cognitive functioning                                 General Comments: patient awake, able to follow directions with increased time and redirection. Patient did  participate in mobility, toileting and ambuylation without aggressve behavior.Patient had 2 mits in place., trying to get toile paer. R  mitt removed , patrient continued to use left mittedhand to try to wipe. dropped tissue and never used right hand. While standing at sink, washed left mitted hand aswell as right. patient required support for balance standing performing face washing.      Exercises      General Comments        Pertinent Vitals/Pain Pain Assessment: No/denies pain    Home Living                      Prior Function            PT Goals (current goals can now be found in the care plan  section) Progress towards PT goals: Progressing toward goals    Frequency    Min 3X/week      PT Plan Current plan remains appropriate    Co-evaluation              AM-PAC PT "6 Clicks" Mobility   Outcome Measure  Help needed turning from your back to your side while in a flat bed without using bedrails?: A Little Help needed moving from lying on your back to sitting on the side of a flat bed without using bedrails?: A Little Help needed moving to and from a bed to a chair (including a wheelchair)?: A Lot Help needed  standing up from a chair using your arms (e.g., wheelchair or bedside chair)?: A Lot Help needed to walk in hospital room?: A Lot Help needed climbing 3-5 steps with a railing? : A Lot 6 Click Score: 14    End of Session Equipment Utilized During Treatment:  (pt had a rollbelt around waist so used it for support.) Activity Tolerance: Patient tolerated treatment well Patient left: in bed;with call bell/phone within reach;with nursing/sitter in room Nurse Communication: Mobility status PT Visit Diagnosis: Unsteadiness on feet (R26.81);Muscle weakness (generalized) (M62.81)     Time: 8338-2505 PT Time Calculation (min) (ACUTE ONLY): 14 min  Charges:  $Gait Training: 8-22 mins                     Donna Harrell PT Acute Rehabilitation Services Pager [phone] Office [phone]    Rada Hay 09/26/2020, 11:51 AM

## 2020-09-26 NOTE — Progress Notes (Signed)
Pt with gown hanging off upon entrance to room. RN attempted to adjust gown to cover the pt's exposed chest. RN asked if she could perform an assessment or get anything for the patient. Pt states, "nope, please leave". Will continue to reassess and attempt to assist the patient.

## 2020-09-26 NOTE — Progress Notes (Signed)
Patient refusing lab draw for morning labs. Linton Flemings NP aware. Phlebotomist states they will try again at a later time.

## 2020-09-26 NOTE — Progress Notes (Signed)
PROGRESS NOTE    Donna Harrell  GYK:[id number] DOB: 22-Aug-1955 DOA: 09/17/2020 PCP: Pcp, No  Brief Narrative:  65 year old female with medical history of diabetes mellitus type 1, depression, hypertension, was presented with confusion. Patient lives alone per family and has not been consistent taking her insulin. Also patient has been very insistent on losing diet and has been eating lettuce and shame. She has had multiple episodes of hypoglycemia as per sister and many times EMS has been called for that. She is also not compliant with taking insulin. When EMS was called to home she was found covered in feces and urine. As per patient's sister for past 2 days they were unable to contact her. In the ED patient was found to be in DKA with continued altered mental status. DKA resolved with appropriate treatment now transitioned off IV insulin. Patient has history of poor p.o. intake. She has been having multiple episodes of hypoglycemia as above. In the hospital patient has been refusing to eat. Psychiatric consultation has been obtained -no current recommendations for inpatient treatment.  Patient admitted as above with acute metabolic encephalopathy in the setting of profound hypoglycemia and DKA with remarkable acidosis.  Unfortunately patient's mental status appears to be far from her previous baseline per family, was living alone here but is currently unable to orient to much more than person.  Concern for ongoing hospital delirium, discontinue any sedating or anxiolytic medications.  Psychiatry has evaluated, no current recommendations for inpatient treatment.  Family meeting planned for 09/27/2020 with further discussion about having family take over as power of attorney given her mental status as well as for family visitation over this weekend with likely discharge into their care as early as 09/27/2020 if family is available.  Assessment & Plan:   Principal Problem:   Altered mental  status Active Problems:   DKA (diabetic ketoacidosis) (HCC)   Essential hypertension   Acute metabolic encephalopathy   Elevated troponin   Elevated CK   Elevated lactic acid level   Encephalopathy   Metabolic encephalopathy, POA, improving Concurrent hospital delirium ongoing - Mental status improving somewhat over the past 48 hours, more interactive and appropriate this morning, still somewhat combative and confused overnight consistent with delirium - Initially thought due to metabolic disturbances but continues despite resolution of DKA  - Psychiatry consulted for further insight and treatment -continue Seroquel - Profound hypoglycemia previously thought to be causing patient's mental status but again hypoglycemia resolved without resolution of symptoms - CT head, MRI and EEG all unremarkable for any acute findings or epileptiform activity - DC all sedating medication at this time; if combative would prefer physical restraints or bedside sitter rather than medications - Patient has completed high-dose IV thiamine; alcohol level within normal limits at intake - Ammonia level is 11, HIV nonreactive, B12 level 374, TSH 2.478  Failure to thrive - severe protein caloric malnutrition - Unclear etiology, prior to admission family indicates patient had intentionally been trying to lose weight in the outpatient setting  - Family indicates patient was having multiple episodes of hypoglycemia at home when she lived with them, unclear frequency now that she lives alone in a different state.  - Questionably underlying behavioral disorder, psychiatry has cleared for further outpatient evaluation - Palliative care for further care discussion of goals of care.  Diabetic ketoacidosis-resolved - Transitioned to clear diet but extremely poor p.o. intake and has been refusing to take p.o. making transition to insulin difficult.  Hypertension -Patient continues to be markedly  noncompliant with  medications, continue IV metoprolol scheduled and as needed hydralazine -We will attempt to add 5 mg p.o. amlodipine daily, follow patient's compliance.  Hypokalemia - Repleted  Elevated troponin - Without ST elevation; Troponin peak at 600, Cardiology previously consulted, no intervention recommended at this time  Elevated CK/rhabdomyolysis - Likely from being on the floor for 2+ days. Patient presented with CK of 2316. Started on rehydration as above for DKA. CK is now down to 461.   Discontinue IV fluids, continue to push p.o. fluids as tolerated.    Anion gap metabolic acidosis - Secondary to lactic acid acidosis and concurrent DKA  Hyperdense right globe - Likely from surgery in the past.   - Patient's right eyeball was found to be hyperdense on CT head. I called and discussed with patient's sister who says that she had 2 surgeries on right eye but her vision could not be restored.  Bilateral leg swelling - Venous duplex of lower extremities was negative for DVT bilaterally -Improving with ambulation  Parotid gland lesion, incidental finding - MRI shows 1.6 cm lesion within the superficial lobe of the left parotid gland, partially visualized. Could be artifact versus primary neoplasm. - Dedicated CT soft tissue neck with contrast suggested when patient is more able to tolerate - will likely need to be an outpatient workup at this point.  DVT prophylaxis: SCDs, early ambulation Code Status: Full Family Communication: updated sister at length over the phone -family requesting a conference call prior to visitation to further evaluate patient's needs.  We continue to explain patient is medically stable for discharge but remains unsafe to discharge here as she lives alone and is currently unable to care for herself.  Status is: Inpt  Dispo: The patient is from: Home              Anticipated d/c is to: TBD              Anticipated d/c date is: 24 to 48 hours              Patient  currently NOT medically stable for discharge  Consultants:   Psych, palliative, PCCM  Procedures:   None  Antimicrobials:  No indication  Subjective: Overnight patient continues become combative with staff - now in 4 point restraints due to self harm and harm to staff. ROS still limited -able to recall part of previous home address but at this point cannot even oriented to current state much less date or situation.  Objective: Vitals:   09/25/20 2234 09/25/20 2300 09/26/20 0000 09/26/20 0400  BP: (!) 172/63 (!) 171/57 (!) 170/64 (!) 172/87  Pulse: 96 (!) 101 95 84  Resp: 15 14 12 15   Temp:   99.2 F (37.3 C) 98.2 F (36.8 C)  TempSrc:   Axillary Oral  SpO2: 99% 100% 100% 100%  Weight:      Height:       No intake or output data in the 24 hours ending 09/26/20 0729 Filed Weights   09/17/20 1856  Weight: 65.8 kg    Examination:  General:  Pleasantly resting in bed, No acute distress.  Alert to person only, partial recollection to previous address but otherwise not oriented to place or time -easily agitated, flight of ideas difficult to reorient. HEENT:  Normocephalic atraumatic.  Sclerae nonicteric, noninjected.  Extraocular movements intact bilaterally. Neck:  Without mass or deformity.  Trachea is midline. Lungs:  Clear to auscultate bilaterally without rhonchi, wheeze, or rales.  Heart:  Regular rate and rhythm.  Without murmurs, rubs, or gallops. Abdomen:  Soft, nontender, nondistended.  Without guarding or rebound. Extremities: Without cyanosis, clubbing, edema, or obvious deformity. Vascular:  Dorsalis pedis and posterior tibial pulses palpable bilaterally. Skin:  Warm and dry, no erythema, no ulcerations.   Data Reviewed: I have personally reviewed following labs and imaging studies  CBC: Recent Labs  Lab 09/22/20 0354 09/25/20 0257  WBC 7.9 7.6  HGB 12.8 11.9*  HCT 41.2 38.2  MCV 81.7 80.4  PLT 177 160   Basic Metabolic Panel: Recent Labs  Lab  09/22/20 0354 09/23/20 0305 09/24/20 0249 09/25/20 0257 09/25/20 0409  NA 137 139 138 139 138  K 3.4* 4.8 3.4* 6.2* 3.5  CL 103 107 106 106 105  CO2 28 20* 23 26 24   GLUCOSE 188* 210* 114* 124* 119*  BUN 7* 8 5* 6* 7*  CREATININE 0.57 0.70 0.55 0.78 0.61  CALCIUM 9.2 9.6 9.1 9.1 9.0   GFR: Estimated Creatinine Clearance: 65.6 mL/min (by C-G formula based on SCr of 0.61 mg/dL). Liver Function Tests: Recent Labs  Lab 09/22/20 0354 09/25/20 0257 09/25/20 0409  AST 55* 54* 22  ALT 36 23 24  ALKPHOS 64 62 64  BILITOT 0.7 1.8* 0.7  PROT 6.2* 6.3* 6.2*  ALBUMIN 3.3* 3.3* 3.3*   No results for input(s): LIPASE, AMYLASE in the last 168 hours. No results for input(s): AMMONIA in the last 168 hours. Coagulation Profile: No results for input(s): INR, PROTIME in the last 168 hours. Cardiac Enzymes: Recent Labs  Lab 09/19/20 1123 09/20/20 0311 09/21/20 0825 09/22/20 0354 09/23/20 0305  CKTOTAL 2,089* 2,316* 1,631* 899* 461*   BNP (last 3 results) No results for input(s): PROBNP in the last 8760 hours. HbA1C: No results for input(s): HGBA1C in the last 72 hours. CBG: Recent Labs  Lab 09/25/20 1125 09/25/20 1602 09/25/20 2109 09/26/20 0003 09/26/20 0402  GLUCAP 163* 255* 402* 274* 191*   Lipid Profile: No results for input(s): CHOL, HDL, LDLCALC, TRIG, CHOLHDL, LDLDIRECT in the last 72 hours. Thyroid Function Tests: No results for input(s): TSH, T4TOTAL, FREET4, T3FREE, THYROIDAB in the last 72 hours. Anemia Panel: No results for input(s): VITAMINB12, FOLATE, FERRITIN, TIBC, IRON, RETICCTPCT in the last 72 hours. Sepsis Labs: No results for input(s): PROCALCITON, LATICACIDVEN in the last 168 hours.  Recent Results (from the past 240 hour(s))  Urine culture     Status: Abnormal   Collection Time: 09/17/20  7:32 PM   Specimen: Urine, Random  Result Value Ref Range Status   Specimen Description   Final    URINE, RANDOM Performed at [facility], [address], [place], [place]    Special Requests   Final    NONE Performed at [facility], [address], [place], [place]    Culture MULTIPLE SPECIES PRESENT, SUGGEST RECOLLECTION (A)  Final   Report Status 09/19/2020 FINAL  Final  Resp Panel by RT-PCR (Flu A&B, Covid) Nasopharyngeal Swab     Status: None   Collection Time: 09/17/20  8:45 PM   Specimen: Nasopharyngeal Swab; Nasopharyngeal(NP) swabs in vial transport medium  Result Value Ref Range Status   SARS Coronavirus 2 by RT PCR NEGATIVE NEGATIVE Final    Comment: (NOTE) SARS-CoV-2 target nucleic acids are NOT DETECTED.  The SARS-CoV-2 RNA is generally detectable in upper respiratory specimens during the acute phase of infection. The lowest concentration of SARS-CoV-2 viral copies this assay can  detect is 138 copies/mL. A negative result does not preclude SARS-Cov-2 infection and should not be used as the sole basis for treatment or other patient management decisions. A negative result may occur with  improper specimen collection/handling, submission of specimen other than nasopharyngeal swab, presence of viral mutation(s) within the areas targeted by this assay, and inadequate number of viral copies(<138 copies/mL). A negative result must be combined with clinical observations, patient history, and epidemiological information. The expected result is Negative.  Fact Sheet for Patients:  BloggerCourse.comhttps://www.fda.gov/media/152166/download  Fact Sheet for Healthcare Providers:  SeriousBroker.ithttps://www.fda.gov/media/152162/download  This test is no t yet approved or cleared by the [place] FDA and  has been authorized for detection and/or diagnosis of SARS-CoV-2 by FDA under an Emergency Use Authorization (EUA). This EUA will remain  in effect (meaning this test can be used) for the duration of the COVID-19 declaration under Section 564(b)(1) of the Act, 21 U.S.C.section  360bbb-3(b)(1), unless the authorization is terminated  or revoked sooner.       Influenza A by PCR NEGATIVE NEGATIVE Final   Influenza B by PCR NEGATIVE NEGATIVE Final    Comment: (NOTE) The Xpert Xpress SARS-CoV-2/FLU/RSV plus assay is intended as an aid in the diagnosis of influenza from Nasopharyngeal swab specimens and should not be used as a sole basis for treatment. Nasal washings and aspirates are unacceptable for Xpert Xpress SARS-CoV-2/FLU/RSV testing.  Fact Sheet for Patients: BloggerCourse.comhttps://www.fda.gov/media/152166/download  Fact Sheet for Healthcare Providers: SeriousBroker.ithttps://www.fda.gov/media/152162/download  This test is not yet approved or cleared by the [place] FDA and has been authorized for detection and/or diagnosis of SARS-CoV-2 by FDA under an Emergency Use Authorization (EUA). This EUA will remain in effect (meaning this test can be used) for the duration of the COVID-19 declaration under Section 564(b)(1) of the Act, 21 U.S.C. section 360bbb-3(b)(1), unless the authorization is terminated or revoked.  Performed at [facility], [address] [place], [place] [address]   MRSA PCR Screening     Status: None   Collection Time: 09/18/20 12:21 AM   Specimen: Nasopharyngeal  Result Value Ref Range Status   MRSA by PCR NEGATIVE NEGATIVE Final    Comment:        The GeneXpert MRSA Assay (FDA approved for NASAL specimens only), is one component of a comprehensive MRSA colonization surveillance program. It is not intended to diagnose MRSA infection nor to guide or monitor treatment for MRSA infections. Performed at [facility], [address] [place], [place] [address]          Radiology Studies: No results found. Scheduled Meds: . (feeding supplement) PROSource Plus  30 mL Oral Daily  . chlorhexidine  15 mL Mouth Rinse BID  . Chlorhexidine Gluconate Cloth  6 each Topical Daily  . feeding supplement (GLUCERNA  SHAKE)  237 mL Oral BID BM  . insulin aspart  0-6 Units Subcutaneous Q4H  . insulin glargine  10 Units Subcutaneous QHS  . mouth rinse  15 mL Mouth Rinse q12n4p  . metoprolol tartrate  5 mg Intravenous Q6H  . QUEtiapine  12.5 mg Oral BID  . sodium chloride flush  10-40 mL Intracatheter Q12H   Continuous Infusions: . sodium chloride Stopped (09/19/20 1047)     LOS: 8 days   Time spent: 25min  Azucena FallenWilliam C Lancaster, DO [facility]  If 7PM-7AM, please contact night-coverage [email or web address]  09/26/2020, 7:29 AM

## 2020-09-26 NOTE — Progress Notes (Signed)
PT Cancellation Note  Patient Details Name: Donna Harrell MRN: [id number] DOB: 09/30/1955   Cancelled Treatment:    Reason Eval/Treat Not Completed: Patient's level of consciousness, requiring restraints. Will follow.   Rada Hay 09/26/2020, 8:02 AM  Blanchard Kelch PT Acute Rehabilitation Services Pager [phone] Office [phone]

## 2020-09-26 NOTE — Progress Notes (Signed)
Patient with increased agitation and combativeness, expressing verbal abuse to nursing staff, pulling at lines, and climbing out of bed despite bilateral wrist restraints. Continues to refuse care, medications, turns, and incontinent care. Linton Flemings NP Notified. See orders for details.   1115: Patient placed in bilateral ankle restraints and a waist belt per NP order. See order and flow sheet for details. Will continue to monitor patient.

## 2020-09-26 NOTE — Plan of Care (Signed)
  Problem: Safety: Goal: Non-violent Restraint(s) Outcome: Progressing-Free from injury.    Problem: Clinical Measurements: Goal: Ability to maintain clinical measurements within normal limits will improve Outcome: Progressing Goal: Will remain free from infection Outcome: Progressing Goal: Diagnostic test results will improve Outcome: Progressing Goal: Respiratory complications will improve Outcome: Progressing Goal: Cardiovascular complication will be avoided Outcome: Progressing   Problem: Elimination: Goal: Will not experience complications related to bowel motility Outcome: Progressing Goal: Will not experience complications related to urinary retention Outcome: Progressing   Problem: Pain Managment: Goal: General experience of comfort will improve Outcome: Progressing   Problem: Safety: Goal: Ability to remain free from injury will improve Outcome: Progressing   Problem: Skin Integrity: Goal: Risk for impaired skin integrity will decrease Outcome: Progressing   Problem: Cardiac: Goal: Ability to maintain an adequate cardiac output will improve Outcome: Progressing   Problem: Fluid Volume: Goal: Ability to achieve a balanced intake and output will improve Outcome: Progressing   Problem: Respiratory: Goal: Will regain and/or maintain adequate ventilation Outcome: Progressing   Problem: Urinary Elimination: Goal: Ability to achieve and maintain adequate renal perfusion and functioning will improve Outcome: Progressing

## 2020-09-26 NOTE — Progress Notes (Signed)
Patient refusing insulin and care and kicking at staff. States she wants staff to go away. Unable to provide insulin at this time. Will continue to monitor patient.

## 2020-09-27 DIAGNOSIS — E1010 Type 1 diabetes mellitus with ketoacidosis without coma: Secondary | ICD-10-CM | POA: Diagnosis not present

## 2020-09-27 DIAGNOSIS — R4182 Altered mental status, unspecified: Secondary | ICD-10-CM | POA: Diagnosis not present

## 2020-09-27 DIAGNOSIS — R748 Abnormal levels of other serum enzymes: Secondary | ICD-10-CM | POA: Diagnosis not present

## 2020-09-27 DIAGNOSIS — R7989 Other specified abnormal findings of blood chemistry: Secondary | ICD-10-CM | POA: Diagnosis not present

## 2020-09-27 LAB — CBC
HCT: 37.4 % (ref 36.0–46.0)
Hemoglobin: 11.8 g/dL — ABNORMAL LOW (ref 12.0–15.0)
MCH: 24.8 pg — ABNORMAL LOW (ref 26.0–34.0)
MCHC: 31.6 g/dL (ref 30.0–36.0)
MCV: 78.7 fL — ABNORMAL LOW (ref 80.0–100.0)
Platelets: 190 10*3/uL (ref 150–400)
RBC: 4.75 MIL/uL (ref 3.87–5.11)
RDW: 14.4 % (ref 11.5–15.5)
WBC: 6.9 10*3/uL (ref 4.0–10.5)
nRBC: 0 % (ref 0.0–0.2)

## 2020-09-27 LAB — GLUCOSE, CAPILLARY
Glucose-Capillary: 101 mg/dL — ABNORMAL HIGH (ref 70–99)
Glucose-Capillary: 105 mg/dL — ABNORMAL HIGH (ref 70–99)
Glucose-Capillary: 132 mg/dL — ABNORMAL HIGH (ref 70–99)
Glucose-Capillary: 281 mg/dL — ABNORMAL HIGH (ref 70–99)
Glucose-Capillary: 301 mg/dL — ABNORMAL HIGH (ref 70–99)
Glucose-Capillary: 87 mg/dL (ref 70–99)

## 2020-09-27 LAB — COMPREHENSIVE METABOLIC PANEL
ALT: 21 U/L (ref 0–44)
AST: 24 U/L (ref 15–41)
Albumin: 3.2 g/dL — ABNORMAL LOW (ref 3.5–5.0)
Alkaline Phosphatase: 63 U/L (ref 38–126)
Anion gap: 11 (ref 5–15)
BUN: 10 mg/dL (ref 8–23)
CO2: 22 mmol/L (ref 22–32)
Calcium: 9.2 mg/dL (ref 8.9–10.3)
Chloride: 107 mmol/L (ref 98–111)
Creatinine, Ser: 0.58 mg/dL (ref 0.44–1.00)
GFR, Estimated: >60 mL/min (ref >60)
Glucose, Bld: 130 mg/dL — ABNORMAL HIGH (ref 70–99)
Potassium: 3.8 mmol/L (ref 3.5–5.1)
Sodium: 140 mmol/L (ref 135–145)
Total Bilirubin: 0.8 mg/dL (ref 0.3–1.2)
Total Protein: 6.3 g/dL — ABNORMAL LOW (ref 6.5–8.1)

## 2020-09-27 NOTE — Progress Notes (Signed)
PROGRESS NOTE    Donna Harrell  ZOX:[id number] DOB: 03/25/1956 DOA: 09/17/2020 PCP: Pcp, No  Brief Narrative:  65 year old female with medical history of diabetes mellitus type 1, depression, hypertension, was presented with confusion. Patient lives alone per family and has not been consistent taking her insulin. Also patient has been very insistent on losing diet and has been eating lettuce and shame. She has had multiple episodes of hypoglycemia as per sister and many times EMS has been called for that. She is also not compliant with taking insulin. When EMS was called to home she was found covered in feces and urine. As per patient's sister for past 2 days they were unable to contact her. In the ED patient was found to be in DKA with continued altered mental status. DKA resolved with appropriate treatment now transitioned off IV insulin. Patient has history of poor p.o. intake. She has been having multiple episodes of hypoglycemia as above. In the hospital patient has been refusing to eat. Psychiatric consultation has been obtained -no current recommendations for inpatient treatment.  Patient admitted as above with acute metabolic encephalopathy in the setting of profound hypoglycemia and DKA with remarkable acidosis.  Unfortunately patient's mental status appears to be far from her previous baseline per family, was living alone here but is currently unable to orient to much more than person.  Concern for ongoing hospital delirium, discontinue any sedating or anxiolytic medications with moderate improvement in somnolence/mental status/interaction.  Psychiatry has evaluated, no current recommendations for inpatient treatment.  Family meeting planned for 09/27/2020 with further discussion about having family take over as power of attorney given her mental status as well as for family visitation over this weekend with likely discharge into their care as early as 09/27/2020 if family is  available.  Patient's family unavailable over the phone today, planned for conference call with family in [place] to discuss disposition.  They may be driving down which was an option, this may be why they are unavailable by phone today.  Assessment & Plan:   Principal Problem:   Altered mental status Active Problems:   DKA (diabetic ketoacidosis) (HCC)   Essential hypertension   Acute metabolic encephalopathy   Elevated troponin   Elevated CK   Elevated lactic acid level   Encephalopathy   Metabolic encephalopathy, POA, improving Concurrent hospital delirium ongoing - Mental status improving somewhat over the past 48 hours, more interactive and appropriate this morning, still somewhat combative and confused overnight consistent with delirium - Initially thought due to metabolic disturbances but continues despite resolution of DKA  - Psychiatry consulted for further insight and treatment -continue Seroquel - Profound hypoglycemia previously thought to be causing patient's mental status but again hypoglycemia resolved without resolution of symptoms - CT head, MRI and EEG all unremarkable for any acute findings or epileptiform activity - DC all sedating medication at this time; if combative would prefer physical restraints or bedside sitter rather than medications - Patient has completed high-dose IV thiamine; alcohol level within normal limits at intake - Ammonia level is 11, HIV nonreactive, B12 level 374, TSH 2.478  Failure to thrive - severe protein caloric malnutrition - Unclear etiology, prior to admission family indicates patient had intentionally been trying to lose weight in the outpatient setting  - Family indicates patient was having multiple episodes of hypoglycemia at home when she lived with them, unclear frequency now that she lives alone in a different state.  - Questionably underlying behavioral disorder, psychiatry has cleared for further  outpatient evaluation -  Palliative care for further care discussion of goals of care.  Diabetic ketoacidosis-resolved - Transitioned to clear diet but extremely poor p.o. intake and has been refusing to take p.o. making transition to insulin difficult.  Hypertension -Patient continues to be markedly noncompliant with medications, continue IV metoprolol scheduled and as needed hydralazine -We will attempt to add 5 mg p.o. amlodipine daily, follow patient's compliance.  Hypokalemia - Repleted  Elevated troponin - Without ST elevation; Troponin peak at 600, Cardiology previously consulted, no intervention recommended at this time  Elevated CK/rhabdomyolysis - Likely from being on the floor for 2+ days. Patient presented with CK of 2316. Started on rehydration as above for DKA. CK is now down to 461.   Discontinue IV fluids, continue to push p.o. fluids as tolerated.    Anion gap metabolic acidosis - Secondary to lactic acid acidosis and concurrent DKA  Hyperdense right globe - Likely from surgery in the past.   - Patient's right eyeball was found to be hyperdense on CT head. I called and discussed with patient's sister who says that she had 2 surgeries on right eye but her vision could not be restored.  Bilateral leg swelling - Venous duplex of lower extremities was negative for DVT bilaterally -Improving with ambulation  Parotid gland lesion, incidental finding - MRI shows 1.6 cm lesion within the superficial lobe of the left parotid gland, partially visualized. Could be artifact versus primary neoplasm. - Dedicated CT soft tissue neck with contrast suggested when patient is more able to tolerate - will likely need to be an outpatient workup at this point.  DVT prophylaxis: SCDs, early ambulation Code Status: Full Family Communication: updated sister at length over the phone -family requesting a conference call prior to visitation to further evaluate patient's needs.  We continue to explain patient is  medically stable for discharge but remains unsafe to discharge here as she lives alone and is currently unable to care for herself.  Status is: Inpt  Dispo: The patient is from: Home              Anticipated d/c is to: TBD              Anticipated d/c date is: Imminent, patient medically stable for discharge, remains unsafe discharge due to mental status and combative nature, unable to leave on her own, would be reasonable to discharge with family.  Other options include discharge to SNF however given her need for restraints and combative nature it is unclear whether SNF will be able to accommodate the patient.              Patient currently IS medically stable for discharge  Consultants:   Psych, palliative, PCCM  Procedures:   None  Antimicrobials:  No indication  Subjective: Overnight patient continues become combative with staff - remains in 4 point restraints due to self harm and harm to staff.  Patient again refusing to interact this morning in any meaningful way, review of systems markedly limited.  Objective: Vitals:   09/26/20 1728 09/26/20 2121 09/27/20 0120 09/27/20 0529  BP: (!) 127/57 (!) 151/61 (!) 146/64 (!) 94/38  Pulse: 86 92 96 77  Resp: 16 16 16    Temp: 98.2 F (36.8 C) 98.5 F (36.9 C) 98.6 F (37 C) 98.4 F (36.9 C)  TempSrc: Axillary Oral Oral Oral  SpO2: 99% 100% 100% 97%  Weight:      Height:       No intake  or output data in the 24 hours ending 09/27/20 0802 Filed Weights   09/17/20 1856  Weight: 65.8 kg    Examination:  General: Pleasantly resting in bed, No acute distress.  Unable to assess orientation, patient not interacting in any meaningful way continues to pretend to be asleep. HEENT:  Normocephalic atraumatic.  Sclerae nonicteric, noninjected.  Extraocular movements intact bilaterally. Neck:  Without mass or deformity.  Trachea is midline. Lungs:  Clear to auscultate bilaterally without rhonchi, wheeze, or rales. Heart:  Regular rate  and rhythm.  Without murmurs, rubs, or gallops. Abdomen:  Soft, nontender, nondistended.  Without guarding or rebound. Extremities: Without cyanosis, clubbing, edema, or obvious deformity. Vascular:  Dorsalis pedis and posterior tibial pulses palpable bilaterally. Skin:  Warm and dry, no erythema, no ulcerations.   Data Reviewed: I have personally reviewed following labs and imaging studies  CBC: Recent Labs  Lab 09/22/20 0354 09/25/20 0257 09/27/20 0620  WBC 7.9 7.6 6.9  HGB 12.8 11.9* 11.8*  HCT 41.2 38.2 37.4  MCV 81.7 80.4 78.7*  PLT 177 160 190   Basic Metabolic Panel: Recent Labs  Lab 09/23/20 0305 09/24/20 0249 09/25/20 0257 09/25/20 0409 09/27/20 0620  NA 139 138 139 138 140  K 4.8 3.4* 6.2* 3.5 3.8  CL 107 106 106 105 107  CO2 20* 23 26 24 22   GLUCOSE 210* 114* 124* 119* 130*  BUN 8 5* 6* 7* 10  CREATININE 0.70 0.55 0.78 0.61 0.58  CALCIUM 9.6 9.1 9.1 9.0 9.2   GFR: Estimated Creatinine Clearance: 65.6 mL/min (by C-G formula based on SCr of 0.58 mg/dL). Liver Function Tests: Recent Labs  Lab 09/22/20 0354 09/25/20 0257 09/25/20 0409 09/27/20 0620  AST 55* 54* 22 24  ALT 36 23 24 21   ALKPHOS 64 62 64 63  BILITOT 0.7 1.8* 0.7 0.8  PROT 6.2* 6.3* 6.2* 6.3*  ALBUMIN 3.3* 3.3* 3.3* 3.2*   No results for input(s): LIPASE, AMYLASE in the last 168 hours. No results for input(s): AMMONIA in the last 168 hours. Coagulation Profile: No results for input(s): INR, PROTIME in the last 168 hours. Cardiac Enzymes: Recent Labs  Lab 09/21/20 0825 09/22/20 0354 09/23/20 0305  CKTOTAL 1,631* 899* 461*   BNP (last 3 results) No results for input(s): PROBNP in the last 8760 hours. HbA1C: No results for input(s): HGBA1C in the last 72 hours. CBG: Recent Labs  Lab 09/26/20 1720 09/26/20 2118 09/27/20 0034 09/27/20 0530 09/27/20 0747  GLUCAP 372* 192* 301* 132* 101*   Lipid Profile: No results for input(s): CHOL, HDL, LDLCALC, TRIG, CHOLHDL, LDLDIRECT  in the last 72 hours. Thyroid Function Tests: No results for input(s): TSH, T4TOTAL, FREET4, T3FREE, THYROIDAB in the last 72 hours. Anemia Panel: No results for input(s): VITAMINB12, FOLATE, FERRITIN, TIBC, IRON, RETICCTPCT in the last 72 hours. Sepsis Labs: No results for input(s): PROCALCITON, LATICACIDVEN in the last 168 hours.  Recent Results (from the past 240 hour(s))  Urine culture     Status: Abnormal   Collection Time: 09/17/20  7:32 PM   Specimen: Urine, Random  Result Value Ref Range Status   Specimen Description   Final    URINE, RANDOM Performed at [facility], [address] [place], [place] [address]    Special Requests   Final    NONE Performed at [facility], [address] [place], [place] [address]    Culture MULTIPLE SPECIES PRESENT, SUGGEST RECOLLECTION (A)  Final   Report Status 09/19/2020  FINAL  Final  Resp Panel by RT-PCR (Flu A&B, Covid) Nasopharyngeal Swab     Status: None   Collection Time: 09/17/20  8:45 PM   Specimen: Nasopharyngeal Swab; Nasopharyngeal(NP) swabs in vial transport medium  Result Value Ref Range Status   SARS Coronavirus 2 by RT PCR NEGATIVE NEGATIVE Final    Comment: (NOTE) SARS-CoV-2 target nucleic acids are NOT DETECTED.  The SARS-CoV-2 RNA is generally detectable in upper respiratory specimens during the acute phase of infection. The lowest concentration of SARS-CoV-2 viral copies this assay can detect is 138 copies/mL. A negative result does not preclude SARS-Cov-2 infection and should not be used as the sole basis for treatment or other patient management decisions. A negative result may occur with  improper specimen collection/handling, submission of specimen other than nasopharyngeal swab, presence of viral mutation(s) within the areas targeted by this assay, and inadequate number of viral copies(<138 copies/mL). A negative result must be combined with clinical observations,  patient history, and epidemiological information. The expected result is Negative.  Fact Sheet for Patients:  BloggerCourse.com  Fact Sheet for Healthcare Providers:  SeriousBroker.it  This test is no t yet approved or cleared by the [place] FDA and  has been authorized for detection and/or diagnosis of SARS-CoV-2 by FDA under an Emergency Use Authorization (EUA). This EUA will remain  in effect (meaning this test can be used) for the duration of the COVID-19 declaration under Section 564(b)(1) of the Act, 21 U.S.C.section 360bbb-3(b)(1), unless the authorization is terminated  or revoked sooner.       Influenza A by PCR NEGATIVE NEGATIVE Final   Influenza B by PCR NEGATIVE NEGATIVE Final    Comment: (NOTE) The Xpert Xpress SARS-CoV-2/FLU/RSV plus assay is intended as an aid in the diagnosis of influenza from Nasopharyngeal swab specimens and should not be used as a sole basis for treatment. Nasal washings and aspirates are unacceptable for Xpert Xpress SARS-CoV-2/FLU/RSV testing.  Fact Sheet for Patients: BloggerCourse.com  Fact Sheet for Healthcare Providers: SeriousBroker.it  This test is not yet approved or cleared by the [place] FDA and has been authorized for detection and/or diagnosis of SARS-CoV-2 by FDA under an Emergency Use Authorization (EUA). This EUA will remain in effect (meaning this test can be used) for the duration of the COVID-19 declaration under Section 564(b)(1) of the Act, 21 U.S.C. section 360bbb-3(b)(1), unless the authorization is terminated or revoked.  Performed at [facility], [address] [place], [place] [address]   MRSA PCR Screening     Status: None   Collection Time: 09/18/20 12:21 AM   Specimen: Nasopharyngeal  Result Value Ref Range Status   MRSA by PCR NEGATIVE NEGATIVE Final    Comment:        The  GeneXpert MRSA Assay (FDA approved for NASAL specimens only), is one component of a comprehensive MRSA colonization surveillance program. It is not intended to diagnose MRSA infection nor to guide or monitor treatment for MRSA infections. Performed at [facility], [address], [place], [place] [address]          Radiology Studies: No results found. Scheduled Meds: . (feeding supplement) PROSource Plus  30 mL Oral Daily  . amLODipine  5 mg Oral Daily  . chlorhexidine  15 mL Mouth Rinse BID  . Chlorhexidine Gluconate Cloth  6 each Topical Daily  . feeding supplement (GLUCERNA SHAKE)  237 mL Oral BID BM  . insulin aspart  0-6 Units Subcutaneous Q4H  .  insulin glargine  10 Units Subcutaneous QHS  . mouth rinse  15 mL Mouth Rinse q12n4p  . metoprolol tartrate  5 mg Intravenous Q6H  . QUEtiapine  12.5 mg Oral BID  . sodium chloride flush  10-40 mL Intracatheter Q12H   Continuous Infusions: . sodium chloride Stopped (09/19/20 1047)     LOS: 9 days   Time spent:  Azucena Fallen, DO [facility]  If 7PM-7AM, please contact night-coverage [email or web address]  09/27/2020, 8:02 AM

## 2020-09-27 NOTE — [place] FL2 (Signed)
[place] MEDICAID FL2 LEVEL OF CARE SCREENING TOOL     IDENTIFICATION  Patient Name: Donna Harrell Birthdate: Oct 23, 1955 Sex: female Admission Date (Current Location): 09/17/2020  [facility] and [place] Number:  Producer, television/film/video and Address:         Provider Number: [id number]  Attending Physician Name and Address:  Azucena Fallen, MD  Relative Name and Phone Number:  Annalee Genta [phone]    Current Level of Care: Hospital Recommended Level of Care: Skilled Nursing Facility Prior Approval Number:    Date Approved/Denied:   PASRR Number: [id number]  Discharge Plan: SNF    Current Diagnoses: Patient Active Problem List   Diagnosis Date Noted  . Altered mental status 09/24/2020  . Essential hypertension 09/18/2020  . Acute metabolic encephalopathy 09/18/2020  . Elevated troponin 09/18/2020  . Elevated CK 09/18/2020  . Elevated lactic acid level 09/18/2020  . Encephalopathy 09/18/2020  . DKA (diabetic ketoacidosis) (HCC) 09/17/2020    Orientation RESPIRATION BLADDER Height & Weight     Self    Continent Weight: 145 lb (65.8 kg) Height:  5\' 5"  (165.1 cm)  BEHAVIORAL SYMPTOMS/MOOD NEUROLOGICAL BOWEL NUTRITION STATUS      Continent Diet (Carb Modified)  AMBULATORY STATUS COMMUNICATION OF NEEDS Skin   Limited Assist Verbally                         Personal Care Assistance Level of Assistance  Bathing,Feeding,Dressing Bathing Assistance: Limited assistance Feeding assistance: Independent Dressing Assistance: Limited assistance     Functional Limitations Info  Sight,Hearing,Speech Sight Info: Adequate Hearing Info: Adequate Speech Info: Adequate    SPECIAL CARE FACTORS FREQUENCY  PT (By licensed PT),OT (By licensed OT)     PT Frequency: 5x a week OT Frequency: 5x a week            Contractures Contractures Info: Not present    Additional Factors Info  Code Status,Allergies Code Status Info: Full Allergies Info: NKA            Current Medications (09/27/2020):  This is the current hospital active medication list Current Facility-Administered Medications  Medication Dose Route Frequency Provider Last Rate Last Admin  . (feeding supplement) PROSource Plus liquid 30 mL  30 mL Oral Daily 11/27/2020, MD   30 mL at 09/26/20 1311  . 0.9 %  sodium chloride infusion   Intravenous PRN 11/26/20, MD   Stopped at 09/19/20 1047  . acetaminophen (TYLENOL) suppository 650 mg  650 mg Rectal Q6H PRN 11/19/20, MD   650 mg at 09/18/20 1253  . amLODipine (NORVASC) tablet 5 mg  5 mg Oral Daily 11/18/20, MD   5 mg at 09/26/20 1310  . chlorhexidine (PERIDEX) 0.12 % solution 15 mL  15 mL Mouth Rinse BID Doutova, Anastassia, MD   15 mL at 09/26/20 1310  . Chlorhexidine Gluconate Cloth 2 % PADS 6 each  6 each Topical Daily 11/26/20, MD   6 each at 09/25/20 1404  . dextrose 50 % solution 0-50 mL  0-50 mL Intravenous PRN 06-30-1974, MD   50 mL at 09/22/20 0330  . feeding supplement (GLUCERNA SHAKE) (GLUCERNA SHAKE) liquid 237 mL  237 mL Oral BID BM 11/22/20, MD      . hydrALAZINE (APRESOLINE) injection 10 mg  10 mg Intravenous Q4H PRN Azucena Fallen, MD   10 mg at 09/23/20 0316  . insulin aspart (novoLOG) injection  0-6 Units  0-6 Units Subcutaneous Q4H Meredeth Ide, MD   4 Units at 09/27/20 0046  . insulin glargine (LANTUS) injection 10 Units  10 Units Subcutaneous QHS Meredeth Ide, MD   10 Units at 09/26/20 2210  . MEDLINE mouth rinse  15 mL Mouth Rinse q12n4p Doutova, Anastassia, MD   15 mL at 09/26/20 1310  . metoprolol tartrate (LOPRESSOR) injection 5 mg  5 mg Intravenous Q6H Manuela Schwartz, NP   5 mg at 09/27/20 0601  . QUEtiapine (SEROQUEL) tablet 12.5 mg  12.5 mg Oral BID Maryagnes Amos, FNP   12.5 mg at 09/26/20 1312  . sodium chloride flush (NS) 0.9 % injection 10-40 mL  10-40 mL Intracatheter Q12H Azucena Fallen, MD   10 mL at 09/26/20 2238  .  sodium chloride flush (NS) 0.9 % injection 10-40 mL  10-40 mL Intracatheter PRN Azucena Fallen, MD         Discharge Medications: Please see discharge summary for a list of discharge medications.  Relevant Imaging Results:  Relevant Lab Results:   Additional Information SSN#:  [id number]  Ricquita C Tarpley-Carter, LCSWA

## 2020-09-27 NOTE — TOC Progression Note (Signed)
Transition of Care [facility]) - Progression Note    Patient Details  Name: Donna Harrell MRN: [id number] Date of Birth: 09/27/1955  [facility] CM/SW Contact  Armanda Heritage, RN Phone Number: 09/27/2020, 1:59 PM  Clinical Narrative:    CM spoke with patient's sister Annalee Genta, who lives in [place] and reports she is leaving tomorrow morning (Sunday 3/12) to come to [facility] and see patient.  Sister reports patient lived alone prior to hospitalization but remains confused and appears to be unable to care for herself.  CM discussed PT progress notes and recommendations touching on recommendation for SNF versus faily taking patient home and caring for her with 24/7 assist.  Sister expresses that she is uncertain if taking her home will be an option or if patient will be able to handle the long drive to [place].  Sister expresses she was hopefull for some kind of rehab option.  CM discussed SNF for short term rehab and placement process.  CM discussed case with CSW with plan to complete and fax out FL2 to initiate facility search.  Sister also requests MD update her on patient condition, secure chat message sent to MD with this request.  CSW to complete FL2 and fax out to area SNF.    Expected Discharge Plan: Skilled Nursing Facility Barriers to Discharge: Continued Medical Work up  Expected Discharge Plan and Services Expected Discharge Plan: Skilled Nursing Facility   Discharge Planning Services: CM Consult   Living arrangements for the past 2 months: Apartment                                       Social Determinants of Health (SDOH) Interventions    Readmission Risk Interventions No flowsheet data found.

## 2020-09-28 DIAGNOSIS — R7989 Other specified abnormal findings of blood chemistry: Secondary | ICD-10-CM | POA: Diagnosis not present

## 2020-09-28 DIAGNOSIS — R4182 Altered mental status, unspecified: Secondary | ICD-10-CM | POA: Diagnosis not present

## 2020-09-28 DIAGNOSIS — R748 Abnormal levels of other serum enzymes: Secondary | ICD-10-CM | POA: Diagnosis not present

## 2020-09-28 DIAGNOSIS — E1010 Type 1 diabetes mellitus with ketoacidosis without coma: Secondary | ICD-10-CM | POA: Diagnosis not present

## 2020-09-28 LAB — COMPREHENSIVE METABOLIC PANEL
ALT: 16 U/L (ref 0–44)
AST: 18 U/L (ref 15–41)
Albumin: 3.2 g/dL — ABNORMAL LOW (ref 3.5–5.0)
Alkaline Phosphatase: 58 U/L (ref 38–126)
Anion gap: 7 (ref 5–15)
BUN: 12 mg/dL (ref 8–23)
CO2: 25 mmol/L (ref 22–32)
Calcium: 9.1 mg/dL (ref 8.9–10.3)
Chloride: 105 mmol/L (ref 98–111)
Creatinine, Ser: 0.71 mg/dL (ref 0.44–1.00)
GFR, Estimated: >60 mL/min (ref >60)
Glucose, Bld: 276 mg/dL — ABNORMAL HIGH (ref 70–99)
Potassium: 3.8 mmol/L (ref 3.5–5.1)
Sodium: 137 mmol/L (ref 135–145)
Total Bilirubin: 0.8 mg/dL (ref 0.3–1.2)
Total Protein: 5.8 g/dL — ABNORMAL LOW (ref 6.5–8.1)

## 2020-09-28 LAB — CBC
HCT: 36.2 % (ref 36.0–46.0)
Hemoglobin: 11.4 g/dL — ABNORMAL LOW (ref 12.0–15.0)
MCH: 25.2 pg — ABNORMAL LOW (ref 26.0–34.0)
MCHC: 31.5 g/dL (ref 30.0–36.0)
MCV: 80.1 fL (ref 80.0–100.0)
Platelets: 186 10*3/uL (ref 150–400)
RBC: 4.52 MIL/uL (ref 3.87–5.11)
RDW: 14.6 % (ref 11.5–15.5)
WBC: 6 10*3/uL (ref 4.0–10.5)
nRBC: 0 % (ref 0.0–0.2)

## 2020-09-28 LAB — GLUCOSE, CAPILLARY
Glucose-Capillary: 272 mg/dL — ABNORMAL HIGH (ref 70–99)
Glucose-Capillary: 118 mg/dL — ABNORMAL HIGH (ref 70–99)
Glucose-Capillary: 221 mg/dL — ABNORMAL HIGH (ref 70–99)
Glucose-Capillary: 462 mg/dL — ABNORMAL HIGH (ref 70–99)
Glucose-Capillary: 376 mg/dL — ABNORMAL HIGH (ref 70–99)

## 2020-09-28 LAB — VITAMIN B1: Vitamin B1 (Thiamine): 154.5 nmol/L (ref 66.5–200.0)

## 2020-09-28 LAB — GLUCOSE, RANDOM: Glucose, Bld: 519 mg/dL (ref 70–99)

## 2020-09-28 MED ORDER — INSULIN ASPART 100 UNIT/ML ~~LOC~~ SOLN
8.0000 [IU] | Freq: Once | SUBCUTANEOUS | Status: AC
  Administered 2020-09-28: 8 [IU] via SUBCUTANEOUS

## 2020-09-28 NOTE — Progress Notes (Signed)
PROGRESS NOTE    Donna Harrell  BZJ:[id number] DOB: 04-Jul-1956 DOA: 09/17/2020 PCP: Pcp, No  Brief Narrative:  65 year old female with medical history of diabetes mellitus type 1, depression, hypertension, was presented with confusion. Patient lives alone per family and has not been consistent taking her insulin. Also patient has been very insistent on losing diet and has been eating lettuce and shame. She has had multiple episodes of hypoglycemia as per sister and many times EMS has been called for that. She is also not compliant with taking insulin. When EMS was called to home she was found covered in feces and urine. As per patient's sister for past 2 days they were unable to contact her. In the ED patient was found to be in DKA with continued altered mental status. DKA resolved with appropriate treatment now transitioned off IV insulin. Patient has history of poor p.o. intake. She has been having multiple episodes of hypoglycemia as above. In the hospital patient has been refusing to eat. Psychiatric consultation has been obtained - no current recommendations for inpatient treatment.  Patient admitted as above with acute metabolic encephalopathy in the setting of profound hypoglycemia and DKA with remarkable acidosis.  Unfortunately patient's mental status appears to be far from her previous baseline per family, was living alone here but is currently unable to orient to much more than person.  Concern for ongoing hospital delirium, discontinue any sedating or anxiolytic medications with moderate improvement in somnolence/mental status/interaction.  Psychiatry has evaluated, no current recommendations for inpatient treatment.  Family meeting planned for 09/27/2020 with further discussion about having family take over as power of attorney given her mental status as well as for family visitation over this weekend with likely discharge into their care as early as 09/28/2020 if family is  available.  Patient's family at bedside today - drove down from [place]. Working on re-orienting the patient with hopes for discharge once a safe location can be obtained. CM working on SNF vs home to [place] with family for continued support.  Assessment & Plan:   Principal Problem:   Altered mental status Active Problems:   DKA (diabetic ketoacidosis) (HCC)   Essential hypertension   Acute metabolic encephalopathy   Elevated troponin   Elevated CK   Elevated lactic acid level   Encephalopathy   Metabolic encephalopathy, POA, resolved Concurrent hospital delirium resolving Unspecified behavioral abnormality - ongoing - Patient able to wake up and orient to person and place with family at bedside/video chat over the phone today - further indicating her previous inability/unwillingness to interact is behavioral - Patient was awake and alert today taking PO with provocation/encouragement from family - Initially thought due to metabolic disturbances but continues despite resolution of DKA - likely psychiatric/behavioral given above - Psychiatry previously consulted for further insight and treatment - initially placed on seroquel but discontinued per family wishes given ongoing somnolence. No indication for further inpatient follow up per previous documentation. - Profound hypoglycemia previously thought to be causing patient's mental status but again hypoglycemia resolved without resolution of symptoms - CT head, MRI and EEG all unremarkable for any acute findings or epileptiform activity - DC all sedating medication at this time; if combative would prefer physical restraints or bedside sitter rather than medications - Patient has completed high-dose IV thiamine; alcohol level within normal limits at intake - Ammonia level is 11, HIV nonreactive, B12 level 374, TSH 2.478  Failure to thrive - severe protein caloric malnutrition, resolving - Unclear etiology, prior to admission family  indicates patient had intentionally been trying to lose weight in the outpatient setting  - Family indicates patient was having multiple episodes of hypoglycemia at home when she lived with them, unclear frequency now that she lives alone in a different state.  - Questionably underlying behavioral disorder, psychiatry has cleared for further outpatient evaluation - Patient able to take PO today with family at bedside.  Diabetic ketoacidosis-resolved - Transitioned to clear diet but extremely poor p.o. intake and has been refusing to take p.o. making transition to insulin difficult.  Hypertension -Patient continues to be markedly noncompliant with medications, continue IV metoprolol scheduled and as needed hydralazine -We will attempt to add 5 mg p.o. amlodipine daily, follow patient's compliance.  Hypokalemia - Repleted  Elevated troponin - Without ST elevation; Troponin peak at 600, Cardiology previously consulted, no intervention recommended at this time  Elevated CK/rhabdomyolysis - Likely from being on the floor for 2+ days. Patient presented with CK of 2316. Started on rehydration as above for DKA. CK is now down to 461.   Discontinue IV fluids, continue to push p.o. fluids as tolerated.    Anion gap metabolic acidosis - Secondary to lactic acid acidosis and concurrent DKA  Hyperdense right globe - Likely from surgery in the past.   - Patient's right eyeball was found to be hyperdense on CT head. I called and discussed with patient's sister who says that she had 2 surgeries on right eye but her vision could not be restored.  Bilateral leg swelling - Venous duplex of lower extremities was negative for DVT bilaterally -Improving with ambulation  Parotid gland lesion, incidental finding - MRI shows 1.6 cm lesion within the superficial lobe of the left parotid gland, partially visualized. Could be artifact versus primary neoplasm. - Dedicated CT soft tissue neck with contrast  suggested when patient is more able to tolerate - will likely need to be an outpatient workup at this point.  DVT prophylaxis: SCDs, early ambulation Code Status: Full Family Communication: updated sister at length over the phone -family requesting a conference call prior to visitation to further evaluate patient's needs.  We continue to explain patient is medically stable for discharge but remains unsafe to discharge here as she lives alone and is currently unable to care for herself.  Status is: Inpt  Dispo: The patient is from: Home              Anticipated d/c is to: SNF vs Home to [facility] under family care              Anticipated d/c date is: Imminent, patient medically stable for discharge, remains unsafe discharge due to mental status and combative nature, unable to leave on her own, would be reasonable to discharge with family.  Other options include discharge to SNF however given her need for restraints and combative nature it is unclear whether SNF will be able to accommodate the patient.              Patient currently IS medically stable for discharge  Consultants:   Psych, palliative, PCCM  Procedures:   None  Antimicrobials:  No indication  Subjective: No acute issues overnight - out of restraints this am - family present after lunch encouraging patient to take PO - brother over video chat was able to get patient to wake up and have more appropriate conversation - still not back to baseline per sisters but markedly improving.  Objective: Vitals:   09/27/20 0529 09/27/20 1402 09/27/20 2102  09/28/20 0516  BP: (!) 94/38 (!) 119/46 (!) 122/59 (!) 144/63  Pulse: 77 72 96 89  Resp:  12 18 17   Temp: 98.4 F (36.9 C) 99.3 F (37.4 C) 98 F (36.7 C) 98.3 F (36.8 C)  TempSrc: Oral Oral Oral Oral  SpO2: 97% 95% 97% 99%  Weight:      Height:        Intake/Output Summary (Last 24 hours) at 09/28/2020 0748 Last data filed at 09/27/2020 2100 Gross per 24 hour  Intake 120  ml  Output -  Net 120 ml   Filed Weights   09/17/20 1856  Weight: 65.8 kg    Examination:  General: Pleasantly resting in bed, No acute distress. Continues to pretend to be sleeping - family able to arouse patient - awake alert oriented to person/place and situation. Continues to have LIMITED CAPACITY in decision making HEENT:  Normocephalic atraumatic.  Sclerae nonicteric, noninjected.  Extraocular movements intact bilaterally. Neck:  Without mass or deformity.  Trachea is midline. Lungs:  Clear to auscultate bilaterally without rhonchi, wheeze, or rales. Heart:  Regular rate and rhythm.  Without murmurs, rubs, or gallops. Abdomen:  Soft, nontender, nondistended.  Without guarding or rebound. Extremities: Without cyanosis, clubbing, edema, or obvious deformity. Vascular:  Dorsalis pedis and posterior tibial pulses palpable bilaterally. Skin:  Warm and dry, no erythema, no ulcerations.   Data Reviewed: I have personally reviewed following labs and imaging studies  CBC: Recent Labs  Lab 09/22/20 0354 09/25/20 0257 09/27/20 0620  WBC 7.9 7.6 6.9  HGB 12.8 11.9* 11.8*  HCT 41.2 38.2 37.4  MCV 81.7 80.4 78.7*  PLT 177 160 190   Basic Metabolic Panel: Recent Labs  Lab 09/23/20 0305 09/24/20 0249 09/25/20 0257 09/25/20 0409 09/27/20 0620  NA 139 138 139 138 140  K 4.8 3.4* 6.2* 3.5 3.8  CL 107 106 106 105 107  CO2 20* 23 26 24 22   GLUCOSE 210* 114* 124* 119* 130*  BUN 8 5* 6* 7* 10  CREATININE 0.70 0.55 0.78 0.61 0.58  CALCIUM 9.6 9.1 9.1 9.0 9.2   GFR: Estimated Creatinine Clearance: 65.6 mL/min (by C-G formula based on SCr of 0.58 mg/dL). Liver Function Tests: Recent Labs  Lab 09/22/20 0354 09/25/20 0257 09/25/20 0409 09/27/20 0620  AST 55* 54* 22 24  ALT 36 23 24 21   ALKPHOS 64 62 64 63  BILITOT 0.7 1.8* 0.7 0.8  PROT 6.2* 6.3* 6.2* 6.3*  ALBUMIN 3.3* 3.3* 3.3* 3.2*   No results for input(s): LIPASE, AMYLASE in the last 168 hours. No results for  input(s): AMMONIA in the last 168 hours. Coagulation Profile: No results for input(s): INR, PROTIME in the last 168 hours. Cardiac Enzymes: Recent Labs  Lab 09/21/20 0825 09/22/20 0354 09/23/20 0305  CKTOTAL 1,631* 899* 461*   BNP (last 3 results) No results for input(s): PROBNP in the last 8760 hours. HbA1C: No results for input(s): HGBA1C in the last 72 hours. CBG: Recent Labs  Lab 09/27/20 0747 09/27/20 1400 09/27/20 1701 09/27/20 2059 09/28/20 0744  GLUCAP 101* 87 105* 281* 272*   Lipid Profile: No results for input(s): CHOL, HDL, LDLCALC, TRIG, CHOLHDL, LDLDIRECT in the last 72 hours. Thyroid Function Tests: No results for input(s): TSH, T4TOTAL, FREET4, T3FREE, THYROIDAB in the last 72 hours. Anemia Panel: No results for input(s): VITAMINB12, FOLATE, FERRITIN, TIBC, IRON, RETICCTPCT in the last 72 hours. Sepsis Labs: No results for input(s): PROCALCITON, LATICACIDVEN in the last 168 hours.  No results  found for this or any previous visit (from the past 240 hour(s)).       Radiology Studies: No results found. Scheduled Meds: . (feeding supplement) PROSource Plus  30 mL Oral Daily  . amLODipine  5 mg Oral Daily  . chlorhexidine  15 mL Mouth Rinse BID  . Chlorhexidine Gluconate Cloth  6 each Topical Daily  . feeding supplement (GLUCERNA SHAKE)  237 mL Oral BID BM  . insulin aspart  0-6 Units Subcutaneous Q4H  . insulin glargine  10 Units Subcutaneous QHS  . mouth rinse  15 mL Mouth Rinse q12n4p  . QUEtiapine  12.5 mg Oral BID  . sodium chloride flush  10-40 mL Intracatheter Q12H   Continuous Infusions: . sodium chloride Stopped (09/19/20 1047)     LOS: 10 days   Time spent: 25min  Azucena FallenWilliam C Lancaster, DO [facility]  If 7PM-7AM, please contact night-coverage [email or web address]  09/28/2020, 7:48 AM

## 2020-09-28 NOTE — TOC Progression Note (Signed)
Transition of Care [facility]) - Progression Note    Patient Details  Name: Donna Harrell MRN: [id number] Date of Birth: 01-25-1956  Transition of [facility]) CM/SW Contact  Armanda Heritage, RN Phone Number: 09/28/2020, 2:38 PM  Clinical Narrative:    CM spoke with family at bedside re discharge planning, family here from [facility] and report they will be here tomorrow as well but may need to return to [place] after this.  Family is hopeful for short term rehab to help patient get stronger while they work out plans in [place].  Final plan will be for patient to go to [place] once she completes rehab.  FL2 faxed out and waiting for bed offers.    Expected Discharge Plan: Skilled Nursing Facility Barriers to Discharge: Continued Medical Work up  Expected Discharge Plan and Services Expected Discharge Plan: Skilled Nursing Facility   Discharge Planning Services: CM Consult   Living arrangements for the past 2 months: Apartment                                       Social Determinants of Health (SDOH) Interventions    Readmission Risk Interventions No flowsheet data found.

## 2020-09-29 DIAGNOSIS — G9340 Encephalopathy, unspecified: Secondary | ICD-10-CM

## 2020-09-29 LAB — GLUCOSE, CAPILLARY
Glucose-Capillary: 182 mg/dL — ABNORMAL HIGH (ref 70–99)
Glucose-Capillary: 302 mg/dL — ABNORMAL HIGH (ref 70–99)
Glucose-Capillary: 261 mg/dL — ABNORMAL HIGH (ref 70–99)
Glucose-Capillary: 261 mg/dL — ABNORMAL HIGH (ref 70–99)

## 2020-09-29 LAB — SARS CORONAVIRUS 2 (TAT 6-24 HRS): SARS Coronavirus 2: NEGATIVE

## 2020-09-29 NOTE — Progress Notes (Signed)
Inpatient Diabetes Program Recommendations  AACE/ADA: New Consensus Statement on Inpatient Glycemic Control (2015)  Target Ranges:  Prepandial:   less than 140 mg/dL      Peak postprandial:   less than 180 mg/dL (1-2 hours)      Critically ill patients:  140 - 180 mg/dL   Lab Results  Component Value Date   GLUCAP 302 (H) 09/29/2020   HGBA1C 7.1 (H) 09/19/2020    Review of Glycemic Control  Diabetes history: DM1 Outpatient Diabetes medications: Lantus 8 units QHS, Humalog s/s Current orders for Inpatient glycemic control: Lantus 10 units QHS, Novolog 0-6 units Q4H  CBGs 182, 302 mg/dL To be d/ced to SNF, then will go to [place] with sisters. Pt cannot participate in her own diabetes education d/t mental status.   Inpatient Diabetes Program Recommendations:     Educated patient's sisters on insulin pen use at home. Reviewed all steps if insulin pen including attachment of needle, 2-unit air shot, dialing up dose, giving injection, removing needle, disposal of sharps, storage of unused insulin, disposal of insulin etc. Were able to provide successful return demonstration. Also reviewed troubleshooting with insulin pen. MD to give patient Rxs for insulin pens and insulin pen needles, when discharged from SNF.  Discussed hypoglycemia s/s and treatment and blood sugar monitoring. Will need prescription for glucose meter kit.  Thank you.  , RD, LDN, CDE Inpatient Diabetes Coordinator [phone]    

## 2020-09-29 NOTE — Progress Notes (Signed)
PROGRESS NOTE    Donna Harrell  NWG:[id number] DOB: 13-Apr-1956 DOA: 09/17/2020 PCP: Pcp, No  Brief Narrative:  65 year old female with medical history of diabetes mellitus type 1, depression, hypertension, was presented with confusion. Patient lives alone per family and has not been consistent taking her insulin. Also patient has been very insistent on losing diet and has been eating lettuce and shame. She has had multiple episodes of hypoglycemia as per sister and many times EMS has been called for that. She is also not compliant with taking insulin. When EMS was called to home she was found covered in feces and urine. As per patient's sister for past 2 days they were unable to contact her. In the ED patient was found to be in DKA with continued altered mental status. DKA resolved with appropriate treatment now transitioned off IV insulin. Patient has history of poor p.o. intake. She has been having multiple episodes of hypoglycemia as above. In the hospital patient has been refusing to eat. Psychiatric consultation has been obtained - no current recommendations for inpatient treatment.  Patient admitted as above with acute metabolic encephalopathy in the setting of profound hypoglycemia and DKA with remarkable acidosis.  Unfortunately patient's mental status appears to be far from her previous baseline per family, was living alone here but is currently unable to orient to much more than person.  Concern for ongoing hospital delirium, discontinue any sedating or anxiolytic medications with moderate improvement in somnolence/mental status/interaction.  Psychiatry has evaluated, no current recommendations for inpatient treatment.  Family meeting 09/27/2020 with further discussion about having family take over as power of attorney given her mental status is not yet back to baseline and she remains unsafe discharge under her own care.  Patient's family at bedside again today, likely driving back to [place]  later today. Working on re-orienting the patient with hopes for discharge once a safe location can be obtained.  Patient has multiple bed offers for short-term rehab, family is deciding on which facility to discharge patient to, pending Covid swab likely discharge in the next 24 to 48 hours.  Assessment & Plan:   Principal Problem:   Altered mental status Active Problems:   DKA (diabetic ketoacidosis) (HCC)   Essential hypertension   Acute metabolic encephalopathy   Elevated troponin   Elevated CK   Elevated lactic acid level   Encephalopathy   Metabolic encephalopathy, POA, resolved Concurrent hospital delirium resolving Unspecified behavioral abnormality - ongoing -Patient awake alert oriented to person place and general situation today, sisters at bedside; patient tolerating p.o. quite well this morning although still having some cognitive difficulties with opening packages, speech somewhat confabulating or fractured - Psychiatry previously consulted for further insight and treatment - initially placed on seroquel but discontinued per family wishes given ongoing somnolence. No indication for further inpatient follow up per previous documentation. - Profound hyperglycemia/hypoglycemia previously thought to be causing patient's mental status but again labs are markedly well controlled at this point - CT head, MRI and EEG all unremarkable for any acute findings or epileptiform activity - DC all sedating medication at this time; if combative would prefer physical restraints or bedside sitter rather than medications - Patient has completed high-dose IV thiamine; alcohol level within normal limits at intake - Ammonia level is 11, HIV nonreactive, B12 level 374, TSH 2.478  Failure to thrive - severe protein caloric malnutrition, resolving - Unclear etiology, prior to admission family indicates patient had intentionally been trying to lose weight in the outpatient setting -patient  remains high  risk for discharge on her own, does not appear to have capacity to make medical decisions at this point - Family indicates patient was having multiple episodes of hypoglycemia at home when she lived with them, unclear frequency now that she lives alone in a different state.  - Questionably underlying behavioral disorder, psychiatry has cleared for further outpatient evaluation  Diabetic ketoacidosis-resolved - Transitioned to clear diet but extremely poor p.o. intake and has been refusing to take p.o. making transition to insulin difficult.  Hypertension -Patient continues to be markedly noncompliant with medications, continue IV metoprolol scheduled and as needed hydralazine -We will attempt to add 5 mg p.o. amlodipine daily, follow patient's compliance.  Hypokalemia - Repleted  Elevated troponin - Without ST elevation; Troponin peak at 600, Cardiology previously consulted, no intervention recommended at this time  Elevated CK/rhabdomyolysis - Likely from being on the floor for 2+ days. Patient presented with CK of 2316. Started on rehydration as above for DKA. CK is now down to 461.   Discontinue IV fluids, continue to push p.o. fluids as tolerated.    Anion gap metabolic acidosis - Secondary to lactic acid acidosis and concurrent DKA  Hyperdense right globe - Likely from surgery in the past.   - Patient's right eyeball was found to be hyperdense on CT head. I called and discussed with patient's sister who says that she had 2 surgeries on right eye but her vision could not be restored.  Bilateral leg swelling - Venous duplex of lower extremities was negative for DVT bilaterally -Improving with ambulation  Parotid gland lesion, incidental finding - MRI shows 1.6 cm lesion within the superficial lobe of the left parotid gland, partially visualized. Could be artifact versus primary neoplasm. - Dedicated CT soft tissue neck with contrast suggested when patient is more able to tolerate  - will likely need to be an outpatient workup at this point.  DVT prophylaxis: SCDs, early ambulation Code Status: Full Family Communication: Family at bedside again today, lengthy discussion about disposition needs, now that she has SNF bed offers will likely discharge to SNF once Covid swab is negative in the next 24 to 48 hours so that family can set up for safe disposition back in [place] where patient will ultimately discharged back to.  Status is: Inpt  Dispo: The patient is from: Home              Anticipated d/c is to: SNF              Anticipated d/c date is: 24 hours pending Covid swab, facility placement and transportation.              Patient currently IS medically stable for discharge  Consultants:   Psych, palliative, PCCM  Procedures:   None  Antimicrobials:  No indication  Subjective: No acute issues overnight -  remains out of restraints.  Tolerating p.o. this morning on her own with minimal assistance still requiring monitoring for safety but otherwise review of systems negative denies nausea vomiting diarrhea constipation headache fevers or chills.  Objective: Vitals:   09/28/20 1507 09/28/20 2112 09/29/20 0531 09/29/20 1130  BP: (!) 126/54 (!) 124/50 (!) 123/58 136/65  Pulse: 88 99 94 94  Resp: 18 14 16    Temp: 99.3 F (37.4 C) 99.7 F (37.6 C) 98.6 F (37 C) 98.3 F (36.8 C)  TempSrc: Oral Oral Oral Oral  SpO2: 96% 95% 95% 98%  Weight:      Height:  Intake/Output Summary (Last 24 hours) at 09/29/2020 1441 Last data filed at 09/29/2020 1100 Gross per 24 hour  Intake 595 ml  Output --  Net 595 ml   Filed Weights   09/17/20 1856  Weight: 65.8 kg    Examination:  General: Pleasantly resting in bed, No acute distress.  Awake alert oriented to person place and situation this morning.  Continues to have LIMITED CAPACITY in decision making HEENT:  Normocephalic atraumatic.  Sclerae nonicteric, noninjected.  Extraocular movements intact  bilaterally. Neck:  Without mass or deformity.  Trachea is midline. Lungs:  Clear to auscultate bilaterally without rhonchi, wheeze, or rales. Heart:  Regular rate and rhythm.  Without murmurs, rubs, or gallops. Abdomen:  Soft, nontender, nondistended.  Without guarding or rebound. Extremities: Without cyanosis, clubbing, edema, or obvious deformity. Vascular:  Dorsalis pedis and posterior tibial pulses palpable bilaterally. Skin:  Warm and dry, no erythema, no ulcerations.   Data Reviewed: I have personally reviewed following labs and imaging studies  CBC: Recent Labs  Lab 09/25/20 0257 09/27/20 0620 09/28/20 0833  WBC 7.6 6.9 6.0  HGB 11.9* 11.8* 11.4*  HCT 38.2 37.4 36.2  MCV 80.4 78.7* 80.1  PLT 160 190 186   Basic Metabolic Panel: Recent Labs  Lab 09/24/20 0249 09/25/20 0257 09/25/20 0409 09/27/20 0620 09/28/20 0833 09/28/20 2121  NA 138 139 138 140 137  --   K 3.4* 6.2* 3.5 3.8 3.8  --   CL 106 106 105 107 105  --   CO2 23 26 24 22 25   --   GLUCOSE 114* 124* 119* 130* 276* 519*  BUN 5* 6* 7* 10 12  --   CREATININE 0.55 0.78 0.61 0.58 0.71  --   CALCIUM 9.1 9.1 9.0 9.2 9.1  --    GFR: Estimated Creatinine Clearance: 65.6 mL/min (by C-G formula based on SCr of 0.71 mg/dL). Liver Function Tests: Recent Labs  Lab 09/25/20 0257 09/25/20 0409 09/27/20 0620 09/28/20 0833  AST 54* 22 24 18   ALT 23 24 21 16   ALKPHOS 62 64 63 58  BILITOT 1.8* 0.7 0.8 0.8  PROT 6.3* 6.2* 6.3* 5.8*  ALBUMIN 3.3* 3.3* 3.2* 3.2*   No results for input(s): LIPASE, AMYLASE in the last 168 hours. No results for input(s): AMMONIA in the last 168 hours. Coagulation Profile: No results for input(s): INR, PROTIME in the last 168 hours. Cardiac Enzymes: Recent Labs  Lab 09/23/20 0305  CKTOTAL 461*   BNP (last 3 results) No results for input(s): PROBNP in the last 8760 hours. HbA1C: No results for input(s): HGBA1C in the last 72 hours. CBG: Recent Labs  Lab 09/28/20 1548  09/28/20 2110 09/28/20 2259 09/29/20 0759 09/29/20 1125  GLUCAP 221* 462* 376* 182* 302*   Lipid Profile: No results for input(s): CHOL, HDL, LDLCALC, TRIG, CHOLHDL, LDLDIRECT in the last 72 hours. Thyroid Function Tests: No results for input(s): TSH, T4TOTAL, FREET4, T3FREE, THYROIDAB in the last 72 hours. Anemia Panel: No results for input(s): VITAMINB12, FOLATE, FERRITIN, TIBC, IRON, RETICCTPCT in the last 72 hours. Sepsis Labs: No results for input(s): PROCALCITON, LATICACIDVEN in the last 168 hours.  No results found for this or any previous visit (from the past 240 hour(s)).       Radiology Studies: No results found. Scheduled Meds: . (feeding supplement) PROSource Plus  30 mL Oral Daily  . amLODipine  5 mg Oral Daily  . chlorhexidine  15 mL Mouth Rinse BID  . feeding supplement (GLUCERNA SHAKE)  237 mL Oral BID BM  . insulin aspart  0-6 Units Subcutaneous Q4H  . insulin glargine  10 Units Subcutaneous QHS  . mouth rinse  15 mL Mouth Rinse q12n4p  . sodium chloride flush  10-40 mL Intracatheter Q12H   Continuous Infusions: . sodium chloride Stopped (09/19/20 1047)     LOS: 11 days   Time spent:  Azucena Fallen, DO [facility]  If 7PM-7AM, please contact night-coverage [email or web address]  09/29/2020, 2:41 PM

## 2020-09-29 NOTE — TOC Progression Note (Signed)
Transition of Care [facility]) - Progression Note    Patient Details  Name: Donna Harrell MRN: [id number] Date of Birth: 1955-10-01  [facility] of [facility]) CM/SW Contact  Joaquin Courts, RN Phone Number: 09/29/2020, 11:37 AM  Clinical Narrative:    CM met with family at bedside and presented bed offers.  Family chose [facility].  Facility rep Jackelyn Poling notified of choice, states facility will have a bed available tomorrow 3/15.  Repeat Covid test is in process.  CM requested Debbie call patient's sister Mardene Celeste who would like to sing all necessary paperwork for admission as soon as possible, she is planning to return to [place] today.   Expected Discharge Plan: [place] Barriers to Discharge: Continued Medical Work up  Expected Discharge Plan and Services Expected Discharge Plan: [place]   Discharge Planning Services: CM Consult   Living arrangements for the past 2 months: Apartment                                       Social Determinants of Health (SDOH) Interventions    Readmission Risk Interventions No flowsheet data found.

## 2020-09-29 NOTE — Progress Notes (Signed)
Date and time results received:09/28/2020  2110  Critical Value: cbg 462  Name of Provider Notified: Bruna Potter NP  Orders Received? Or Actions Taken?:New order given

## 2020-09-29 NOTE — Evaluation (Signed)
Occupational Therapy Evaluation Patient Details Name: Donna Harrell MRN: [id number] DOB: [birth year] Today's Date: 09/29/2020    History of Present Illness Pt is 65 yo female admitted with DKA, AMS, metabolic encephalopathy/delirium.  Pt found in her apartment covered in urine and feces. Pt was agitated and aggressive with staff on 09/22/20, in restraints but still required Ativan.  Pt with PMH of diabetes mellitus type 1, depression, hypertension   Clinical Impression   Patient is currently requiring assistance with ADLs including minimal assist with toileting, with standing LE dressing, and with bathing, and stand by assist/setup with grooming and eating, all of which is below patient's typical baseline of being Independent.  Evaluation was rather limited based on pt's level of cooperation with therapy. During this evaluation, patient was limited by agitation and confusion, decreased motivation, and impaired standing balance with 2 mild losses of balance with initial stand and with pivot to chair, which has the potential to impact patient's safety and independence during functional mobility, as well as performance for ADLs. [employer] AM-PAC "6-clicks" Daily Activity Inpatient Short Form score of 18/24 indicates 46.65% ADL impairment this session. Patient lived alone and found at home covered in urine and feces. Pt with h/o inconsistency with her insulin regimen. Pt's faily now want to take her to [place] after rehab for more supervision and care.  Patient demonstrates fair rehab potential depending on cooperation and behavior, and should benefit from continued skilled occupational therapy services while in acute care to maximize safety, independence and quality of life at home.  Continued occupational therapy services in a SNF setting prior to return home is recommended.  ?   Follow Up Recommendations  SNF    Equipment Recommendations  Other (comment) (Defer to post acute recommednations.)     Recommendations for Other Services       Precautions / Restrictions Precautions Precautions: Fall Precaution Comments: can be aggressive, hitting Restrictions Weight Bearing Restrictions: No      Mobility Bed Mobility Overal bed mobility: Modified Independent Bed Mobility: Supine to Sit     Supine to sit: Modified independent (Device/Increase time);HOB elevated       Patient Response: Impulsive  Transfers Overall transfer level: Needs assistance Equipment used: None Transfers: Sit to/from [employer] Sit to Stand: Supervision Stand pivot transfers: Min guard       General transfer comment: Hands were not laid on pt during stand/pivot due to pt agitation, however due to losses of balance with initial stand up and attempt to pivot, would recommend Min guard if pt allows.    Balance Overall balance assessment: Needs assistance Sitting-balance support: Single extremity supported;Feet supported Sitting balance-Leahy Scale: Good       Standing balance-Leahy Scale: Poor Standing balance comment: Loss of balance x2 with stand and attempt to pivot.                           ADL either performed or assessed with clinical judgement   ADL Overall ADL's : Needs assistance/impaired (Impairments seems based more in cognitive deficit and agitation rather than weakness/illness.) Eating/Feeding: Supervision/ safety;Cueing for sequencing;Sitting Eating/Feeding Details (indicate cue type and reason): Increased time to initiate. Pt need max encouragement my therapy and family to agree to get up to recliner for meal. Grooming: Sitting;Set up;Supervision/safety;Cueing for sequencing Grooming Details (indicate cue type and reason): Per general assessment. Upper Body Bathing: Supervision/ safety;Set up;Sitting Upper Body Bathing Details (indicate cue type and reason): Per general assessment.  Lower Body Bathing: Min guard;Sitting/lateral leans Lower Body  Bathing Details (indicate cue type and reason): Per general assessment. Upper Body Dressing : Min guard;Cueing for sequencing;Sitting;Set up Upper Body Dressing Details (indicate cue type and reason): Per general assessment. Lower Body Dressing: Minimal assistance;Sit to/from stand;Sitting/lateral leans Lower Body Dressing Details (indicate cue type and reason): Per general assessment.   Toilet Transfer Details (indicate cue type and reason): Pt pivoted to recliner with 2 losses of balance and pt holding arms out wide in abdution to steady self each time, and able to self correct with close supervision. Impaired awareness of deficits. Toileting- Clothing Manipulation and Hygiene: Sitting/lateral lean;Sit to/from stand;Minimal assistance Toileting - Clothing Manipulation Details (indicate cue type and reason): Per general assessment. Tub/ Shower Transfer: International aid/development worker Details (indicate cue type and reason): Per general assessment. Functional mobility during ADLs: Min guard;+2 for safety/equipment;Cueing for safety;Cueing for sequencing       Vision   Vision Assessment?: No apparent visual deficits     Perception     Praxis      Pertinent Vitals/Pain Pain Assessment: No/denies pain     Hand Dominance Right   Extremity/Trunk Assessment Upper Extremity Assessment Upper Extremity Assessment: Difficult to assess due to impaired cognition (UEs do appear WFL as pt observed during mobility and eating.)   Lower Extremity Assessment Lower Extremity Assessment: Difficult to assess due to impaired cognition;Defer to PT evaluation   Cervical / Trunk Assessment Cervical / Trunk Assessment: Normal   Communication Communication Communication: No difficulties   Cognition Arousal/Alertness: Awake/alert Behavior During Therapy: Impulsive (Confused) Overall Cognitive Status: Impaired/Different from baseline Area of Impairment: Orientation;Memory;Following  commands;Safety/judgement;Problem solving                 Orientation Level: Disoriented to;Place;Time;Situation     Following Commands: Follows one step commands inconsistently (volitional with confusion) Safety/Judgement: Decreased awareness of deficits;Decreased awareness of safety   Problem Solving: Requires verbal cues;Difficulty sequencing;Slow processing     General Comments       Exercises     Shoulder Instructions      Home Living Family/patient expects to be discharged to:: Skilled nursing facility Living Arrangements: Alone                               Additional Comments: Family from [place] present and state plan for pt to go to SNF then home with them to [place].      Prior Functioning/Environment                   OT Problem List: Decreased cognition;Decreased safety awareness;Impaired balance (sitting and/or standing);Decreased knowledge of use of DME or AE;Decreased knowledge of precautions      OT Treatment/Interventions: Self-care/ADL training;Therapeutic exercise;Therapeutic activities;Cognitive remediation/compensation;DME and/or AE instruction;Patient/family education;Balance training    OT Goals(Current goals can be found in the care plan section) Acute Rehab OT Goals Patient Stated Goal: Per family: For pt to cooperate with therapy, go to SNF, then home with them to [place]. OT Goal Formulation: With family Time For Goal Achievement: 10/13/20 Potential to Achieve Goals: Fair ADL Goals Pt Will Perform Grooming: standing;with modified independence Pt Will Perform Lower Body Dressing: with modified independence;sit to/from stand Pt Will Transfer to Toilet: with modified independence;ambulating Pt Will Perform Toileting - Clothing Manipulation and hygiene: with modified independence Additional ADL Goal #1: Pt will demonstrate improved cognition and safety awareness to decrease caregiver burden at home by  consistently following 2-step  instructions 100% of time.  OT Frequency: Min 1X/week   Barriers to D/C:    Family hopes for SNF prior to d/c home with them.       Co-evaluation PT/OT/SLP Co-Evaluation/Treatment: Yes Reason for Co-Treatment: For patient/therapist safety;Necessary to address cognition/behavior during functional activity   OT goals addressed during session: ADL's and self-care      AM-PAC OT "6 Clicks" Daily Activity     Outcome Measure Help from another person eating meals?: A Little Help from another person taking care of personal grooming?: A Little Help from another person toileting, which includes using toliet, bedpan, or urinal?: A Little Help from another person bathing (including washing, rinsing, drying)?: A Little Help from another person to put on and taking off regular upper body clothing?: A Little Help from another person to put on and taking off regular lower body clothing?: A Little 6 Click Score: 18   End of Session Equipment Utilized During Treatment:  (None per pt agitation.) Nurse Communication:  (Cleared OT to see pt.)  Activity Tolerance: Treatment limited secondary to agitation Patient left: in chair;with call bell/phone within reach;with chair alarm set;with family/visitor present  OT Visit Diagnosis: Unsteadiness on feet (R26.81);Other symptoms and signs involving cognitive function;Cognitive communication deficit (R41.841)                Time: 3532-9924 OT Time Calculation (min): 11 min Charges:  OT General Charges $OT Visit: 1 Visit OT Evaluation $OT Eval Low Complexity: 1 Low  Jinelle Butchko, OT Acute Rehab Services Office: [phone] 09/29/2020  Theodoro Clock 09/29/2020, 10:26 AM

## 2020-09-29 NOTE — Progress Notes (Signed)
  Speech Language Pathology Treatment: Dysphagia  Patient Details Name: Donna Harrell MRN: [id number] DOB: [birth year] Today's Date: 09/29/2020 Time: 2060-1561 SLP Time Calculation (min) (ACUTE ONLY): 20 min  Assessment / Plan / Recommendation Clinical Impression  Pt was seen for skilled ST intervention targeting goals for PO tolerance. Pt had her 2 sisters present in the room. She was seated in recliner eating breakfast - wide awake and conversant, but confused. Pt exhibited no overt s/s aspiration following trials of oatmeal, scrambled eggs, or water, and was able to self feed without difficulty. Recommend continuing with current diet. No further ST intervention recommended at this time. Please reconsult if needs arise.    HPI HPI: 65 yo female adm to [facility] with h/o 1 DM, depression, and HTN. Per RD note. "Her family reported that she lives alone. She was found down at the time of a wellness check and was found covered in urine and feces. Her family reported that she has been eating poorly and mainly consuming shrimp and lettuce. She moved to [place] in 06/2020. She was admitted for DKA and AMS. CT head was unremarkable".  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  CXR negative.      SLP Plan  Discharge SLP treatment due to goals met      Recommendations  Diet recommendations: Regular;Thin liquid Liquids provided via: Cup;Straw Medication Administration: Whole meds with liquid Supervision: Patient able to self feed Compensations: Minimize environmental distractions;Slow rate;Small sips/bites Postural Changes and/or Swallow Maneuvers: Seated upright 90 degrees;Upright 30-60 min after meal                Oral Care Recommendations: Staff/trained caregiver to provide oral care;Oral care BID Follow up Recommendations: 24 hour supervision/assistance SLP Visit Diagnosis: Dysphagia, unspecified (R13.10) Plan: Discharge SLP treatment due to (comment)        GO               Jequan Shahin B. Quentin Ore, [employer], [place] Speech Language Pathologist Office: [phone] Pager: [phone]  Shonna Chock 09/29/2020, 11:16 AM

## 2020-09-29 NOTE — Care Management Important Message (Signed)
Important Message  Patient Details IM Letter given to the Patient. Name: Keyera Hattabaugh MRN: [id number] Date of Birth: [birth year]   Medicare Important Message Given:  Yes     Caren Macadam 09/29/2020, 11:01 AM

## 2020-09-29 NOTE — Progress Notes (Signed)
Physical Therapy Treatment Patient Details Name: Donna Harrell MRN: [id number] DOB: Feb 27, 1956 Today's Date: 09/29/2020    History of Present Illness Pt is 65 yo female admitted with DKA, AMS, metabolic encephalopathy/delirium.  Pt found in her apartment covered in urine and feces. Pt was agitated and aggressive with staff on 09/22/20, in restraints but still required Ativan.  Pt with PMH of diabetes mellitus type 1, depression, hypertension    PT Comments    With much encouragement from family and therapists, patient did get up to recliner. Patient is notesd with expressive difficulties. Continue PT for mobility as  Patient able.    Follow Up Recommendations  SNF;Other (comment)     Equipment Recommendations  None recommended by PT    Recommendations for Other Services       Precautions / Restrictions Precautions Precautions: Fall Precaution Comments: can be aggressive, hitting Restrictions Weight Bearing Restrictions: No    Mobility  Bed Mobility Overal bed mobility: Modified Independent Bed Mobility: Supine to Sit     Supine to sit: Modified independent (Device/Increase time);HOB elevated     General bed mobility comments: for safety, .    Transfers Overall transfer level: Needs assistance Equipment used: None Transfers: Sit to/from [employer] Sit to Stand: Supervision Stand pivot transfers: Min guard       General transfer comment: Hands were not laid on pt during stand/pivot due to pt agitation, however due to losses of balance with initial stand up and attempt to pivot, would recommend Min guard if pt allows.  Ambulation/Gait                 Stairs             Wheelchair Mobility    Modified Rankin (Stroke Patients Only)       Balance Overall balance assessment: Needs assistance Sitting-balance support: Single extremity supported;Feet supported Sitting balance-Leahy Scale: Good       Standing balance-Leahy  Scale: Poor Standing balance comment: Loss of balance x2 with stand and attempt to pivot.                            Cognition Arousal/Alertness: Awake/alert Behavior During Therapy: Impulsive Overall Cognitive Status: Impaired/Different from baseline Area of Impairment: Orientation;Memory;Following commands;Safety/judgement;Problem solving                 Orientation Level: Disoriented to;Place;Time;Situation   Memory: Decreased short-term memory;Decreased recall of precautions Following Commands: Follows one step commands inconsistently Safety/Judgement: Decreased awareness of deficits;Decreased awareness of safety   Problem Solving: Requires verbal cues;Difficulty sequencing;Slow processing General Comments: patient indicating that she does not want to get up. 2 family memebers present to encourage patient to participate.      Exercises      General Comments        Pertinent Vitals/Pain Pain Assessment: No/denies pain Faces Pain Scale: No hurt    Home Living Family/patient expects to be discharged to:: Skilled nursing facility Living Arrangements: Alone             Additional Comments: Family from [place] present and state plan for pt to go to SNF then home with them to [place].    Prior Function            PT Goals (current goals can now be found in the care plan section) Acute Rehab PT Goals Patient Stated Goal: Per family: For pt to cooperate with therapy, go to SNF, then  home with them to [place]. Progress towards PT goals: Progressing toward goals    Frequency    Min 2X/week      PT Plan Current plan remains appropriate;Frequency needs to be updated    Co-evaluation PT/OT/SLP Co-Evaluation/Treatment: Yes Reason for Co-Treatment: Necessary to address cognition/behavior during functional activity;For patient/therapist safety PT goals addressed during session: Mobility/safety with mobility OT goals addressed during session: ADL's and  self-care      AM-PAC PT "6 Clicks" Mobility   Outcome Measure  Help needed turning from your back to your side while in a flat bed without using bedrails?: None Help needed moving from lying on your back to sitting on the side of a flat bed without using bedrails?: None Help needed moving to and from a bed to a chair (including a wheelchair)?: A Little Help needed standing up from a chair using your arms (e.g., wheelchair or bedside chair)?: A Little Help needed to walk in hospital room?: A Little Help needed climbing 3-5 steps with a railing? : A Lot 6 Click Score: 19    End of Session   Activity Tolerance: Patient tolerated treatment well Patient left: in chair;with call bell/phone within reach;with chair alarm set;with family/visitor present Nurse Communication: Mobility status PT Visit Diagnosis: Unsteadiness on feet (R26.81);Muscle weakness (generalized) (M62.81)     Time: 1572-6203 PT Time Calculation (min) (ACUTE ONLY): 12 min  Charges:  $Therapeutic Activity: 8-22 mins                     Blanchard Kelch PT Acute Rehabilitation Services Pager [phone] Office [phone]    Rada Hay 09/29/2020, 1:47 PM

## 2020-09-29 NOTE — Discharge Summary (Addendum)
Physician Discharge Summary  Donna Harrell KVQ:[id number] DOB: Dec 24, 1955 DOA: 09/17/2020  PCP: Pcp, No  Admit date: 09/17/2020 Discharge date: 09/30/2020  Admitted From: Home Disposition: SNF  Recommendations for Outpatient Follow-up:  1. Follow up with PCP in [place] about 1-2 weeks after discharge from facility 2. Please obtain BMP/CBC in 1 week, follow glucose closely:  Discharge Condition: Stable CODE STATUS: Full Diet recommendation: Diabetic diet  Brief/Interim Summary: 65 year old female with medical history of diabetes mellitus type 1, depression, hypertension, was presented with confusion. Patient lives alone per family and has not been consistent taking her insulin. Also patient has been very insistent on losing diet and has been eating lettuce and shame.She has had multiple episodes of hypoglycemia as per sister and many times EMS has been called for that.She is also not compliant with taking insulin. When EMS was called to home she was found covered in feces and urine. As per patient's sister for past 2 days they were unable to contact her. In the ED patient was found to be in DKA with continued altered mental status.DKA resolved with appropriate treatment now transitioned off IV insulin. Patient has history of poor p.o. intake. She has been having multiple episodes of hypoglycemia as above.In the hospital patient has been refusing to eat. Psychiatric consultation has been obtained - no current recommendations for inpatient treatment.  Patient admitted as above with acute metabolic encephalopathy in the setting of profound hypoglycemia and DKA with remarkable acidosis.  Unfortunately patient's mental status appears to be far from her previous baseline per family, was living alone here but is currently unable to orient to much more than person.  Concern for ongoing hospital delirium, discontinue any sedating or anxiolytic medications with moderate improvement in somnolence/mental  status/interaction.  Psychiatry has evaluated, no current recommendations for inpatient treatment.  Family meeting 09/27/2020 went well -patient much more interactive and appropriate with family at bedside, patient has been accepted to outside SNF facility with ultimate disposition likely home back in [place] with family given she has very limited capacity to manage her own medical illnesses and remains extremely high risk for discharge home in her current state.  Certainly if patient becomes more awake alert oriented and is more appropriate by the time she is discharged from SNF a case could be made that the patient could make her own medical decisions but this is certainly not the case today at discharge. Covid negative - stable for DC to facility.  Discharge Diagnoses:  Principal Problem:   Altered mental status Active Problems:   DKA (diabetic ketoacidosis) (HCC)   Essential hypertension   Acute metabolic encephalopathy   Elevated troponin   Elevated CK   Elevated lactic acid level   Encephalopathy    Discharge Instructions  Discharge Instructions    Call MD for:  extreme fatigue   Complete by: As directed    Call MD for:  persistant dizziness or light-headedness   Complete by: As directed    Diet - low sodium heart healthy   Complete by: As directed    Increase activity slowly   Complete by: As directed    No wound care   Complete by: As directed      Allergies as of 09/30/2020   Not on File     Medication List    STOP taking these medications   Lantus SoloStar 100 UNIT/ML Solostar Pen Generic drug: insulin glargine Replaced by: insulin glargine 100 UNIT/ML injection     TAKE these medications  amLODipine 5 MG tablet Commonly known as: NORVASC Take 1 tablet (5 mg total) by mouth daily. What changed:   medication strength  how much to take   atorvastatin 40 MG tablet Commonly known as: LIPITOR Take 40 mg by mouth daily.   clobetasol cream 0.05 % Commonly  known as: TEMOVATE Apply 1 application topically every other day.   clopidogrel 75 MG tablet Commonly known as: PLAVIX Take 75 mg by mouth daily.   feeding supplement (GLUCERNA SHAKE) Liqd Take 237 mLs by mouth 2 (two) times daily between meals.   hydrocortisone valerate cream 0.2 % Commonly known as: WESTCORT Apply 1 application topically See admin instructions. APPLY DAILY TO FACE FOR 5 DAY, THEN EVERY OTHER DAY FOR A WEEK, THEN REPEAT   insulin glargine 100 UNIT/ML injection Commonly known as: LANTUS Inject 0.15 mLs (15 Units total) into the skin at bedtime. Replaces: Lantus SoloStar 100 UNIT/ML Solostar Pen   insulin lispro 100 UNIT/ML KwikPen Commonly known as: HUMALOG SMARTSIG:Unit(s) SUB-Q As Directed   lisinopril 20 MG tablet Commonly known as: ZESTRIL Take 20 mg by mouth daily.       Contact information for after-discharge care    Destination    [facility] .   Service: Skilled Nursing Contact information: [address] [place] [id number] [phone]                 Not on File   Procedures/Studies: CT Head Wo Contrast  Result Date: 09/17/2020 CLINICAL DATA:  Found on floor altered EXAM: CT HEAD WITHOUT CONTRAST TECHNIQUE: Contiguous axial images were obtained from the base of the skull through the vertex without intravenous contrast. COMPARISON:  None. FINDINGS: Brain: No acute territorial infarction, hemorrhage, or intracranial mass. Mild atrophy. Minimal white matter hypodensity likely chronic small vessel ischemic change. The ventricles are nonenlarged Vascular: No hyperdense vessels.  Carotid vascular calcification Skull: Normal. Negative for fracture or focal lesion. Sinuses/Orbits: Abnormal hyperdense right globe with scleral band. Other: None IMPRESSION: 1. No CT evidence for acute intracranial abnormality. 2. Atrophy and minimal small vessel ischemic changes of the white matter. 3. Abnormal  hyperdense right globe, correlate with eye exam Electronically Signed   By: Jasmine PangKim  Fujinaga M.D.   On: 09/17/2020 23:40   CT Cervical Spine Wo Contrast  Result Date: 09/17/2020 CLINICAL DATA:  Found on floor EXAM: CT CERVICAL SPINE WITHOUT CONTRAST TECHNIQUE: Multidetector CT imaging of the cervical spine was performed without intravenous contrast. Multiplanar CT image reconstructions were also generated. COMPARISON:  None. FINDINGS: Alignment: Sagittal alignment shows trace anterolisthesis C3 on C4. Facet alignment is within normal limits. There is marked rotation of C1 on C2 presumably related to head positioning. Skull base and vertebrae: No acute fracture. No primary bone lesion or focal pathologic process. Soft tissues and spinal canal: No prevertebral fluid or swelling. No visible canal hematoma. Disc levels: Mild degenerative changes C4-C5 and C6-C7. Facet degenerative change at multiple levels Upper chest: Negative. Other: None IMPRESSION: No fracture identified. There is marked rotation of C1 on C2 which is presumably related to head positioning. Electronically Signed   By: Jasmine PangKim  Fujinaga M.D.   On: 09/17/2020 23:46   MR BRAIN WO CONTRAST  Result Date: 09/18/2020 CLINICAL DATA:  Initial evaluation for acute mental status change. EXAM: MRI HEAD WITHOUT CONTRAST TECHNIQUE: Multiplanar, multiecho pulse sequences of the brain and surrounding structures were obtained without intravenous contrast. COMPARISON:  Prior head CT from 09/17/2020. FINDINGS: Brain: Examination technically limited as the  patient was unable to tolerate the full length of the exam. Diffusion-weighted imaging, FLAIR, provided images are markedly degraded by motion artifact. Cerebral volume within normal limits for age. No abnormal foci of restricted diffusion to suggest acute or subacute ischemia. Gray-white matter differentiation maintained. No encephalomalacia to suggest chronic cortical infarction. No evidence for acute or chronic  intracranial hemorrhage. No visible mass lesion, mass effect or midline shift. No hydrocephalus or extra-axial fluid collection. And gradient echo sequence only were performed. Additionally, Vascular: Major intracranial vascular flow voids are grossly maintained at the skull base. Skull and upper cervical spine: Craniocervical junction not well assessed on this limited examination. Bone marrow signal intensity grossly within normal limits. No scalp soft tissue abnormality. Sinuses/Orbits: Postsurgical changes present at the right globe. Globes orbital soft tissues demonstrate no acute finding. Paranasal sinuses are grossly clear. No mastoid effusion. Other: There is question of a 1.6 cm FLAIR hyperintense lesion within the superficial lobe of the left parotid gland (series 7, image 1), partially visualized. Finding not entirely certain on this limited examination. IMPRESSION: 1. Technically limited exam due to the patient's inability to tolerate the full length of the study and motion artifact. 2. Grossly negative brain MRI for age. No acute intracranial abnormality identified. 3. Question 1.6 cm lesion within the superficial lobe of the left parotid gland, partially visualized. While this finding may be artifactual in nature, a possible primary parotid neoplasm could also be considered. Further evaluation with dedicated CT soft tissue neck with contrast suggested for further evaluation when the patient is able to tolerate. Electronically Signed   By: Rise Mu M.D.   On: 09/18/2020 21:43   DG Chest Portable 1 View  Result Date: 09/17/2020 CLINICAL DATA:  Altered level of consciousness, found down EXAM: PORTABLE CHEST 1 VIEW COMPARISON:  None. FINDINGS: The heart size and mediastinal contours are within normal limits. Both lungs are clear. The visualized skeletal structures are unremarkable. IMPRESSION: No active disease. Electronically Signed   By: Sharlet Salina M.D.   On: 09/17/2020 18:33   DG  Foot 2 Views Right  Result Date: 09/17/2020 CLINICAL DATA:  Found down, hyperglycemia, pain and swelling right foot EXAM: RIGHT FOOT - 2 VIEW COMPARISON:  None. FINDINGS: Frontal and lateral views of the right foot are obtained. Postsurgical changes are seen from prior ankle and hindfoot fusion. There is diffuse osteoarthritis throughout the midfoot and forefoot. Hammertoe deformities are noted. There are no acute displaced fractures. Diffuse soft tissue edema. No destructive bony lesions or periosteal reaction to suggest osteomyelitis. IMPRESSION: 1. Postsurgical changes from ankle and hindfoot fusion. 2. Diffuse osteoarthritis.  No acute or destructive bony lesions. 3. Diffuse soft tissue swelling. Electronically Signed   By: Sharlet Salina M.D.   On: 09/17/2020 22:56   EEG adult  Result Date: 09/18/2020 Charlsie Quest, MD     09/18/2020  2:12 PM Patient Name: Donna Harrell MRN: [id number] Epilepsy Attending: Charlsie Quest Referring Physician/Provider: Dr Mauro Kaufmann Date: 09/18/2020 Duration: 24.21 mins Patient history: 65 year old female with altered mental status.  EEG evaluate for seizures. Level of alertness: Awake, asleep AEDs during EEG study: None Technical aspects: This EEG study was done with scalp electrodes positioned according to the 10-20 International system of electrode placement. Electrical activity was acquired at a sampling rate of 500Hz  and reviewed with a high frequency filter of 70Hz  and a low frequency filter of 1Hz . EEG data were recorded continuously and digitally stored. Description: No clear posterior dominant rhythm was seen.  Sleep was characterized by vertex waves, sleep spindles (12 to 14 Hz), maximal frontocentral region.  EEG showed continuous generalized 5 to 6 Hz theta slowing as well as intermittent generalized 2 to 3 Hz delta slowing. Hyperventilation and photic stimulation were not performed.   ABNORMALITY -Continuous slow, generalized IMPRESSION: This study is  suggestive of moderate diffuse encephalopathy, nonspecific etiology. No seizures or epileptiform discharges were seen throughout the recording. Charlsie Quest   ECHOCARDIOGRAM COMPLETE  Result Date: 09/18/2020    ECHOCARDIOGRAM REPORT   Patient Name:   Donna Harrell Date of Exam: 09/18/2020 Medical Rec #:  [id number]       Height:       65.0 in Accession #:    [id number]      Weight:       145.0 lb Date of Birth:  01-Mar-1956      BSA:          1.725 m Patient Age:    64 years        BP:           164/65 mmHg Patient Gender: F               HR:           106 bpm. Exam Location:  Inpatient Procedure: 2D Echo, Cardiac Doppler and Color Doppler Indications:    Elevated Troponin.  History:        Patient has no prior history of Echocardiogram examinations.                 TIA; Risk Factors:Hypertension and Diabetes.  Sonographer:    Elmarie Shiley Dance Referring Phys: [id number] ANASTASSIA DOUTOVA IMPRESSIONS  1. Left ventricular ejection fraction, by estimation, is >75%. The left ventricle has hyperdynamic function. The left ventricle has no regional wall motion abnormalities. There is mild concentric left ventricular hypertrophy. Left ventricular diastolic parameters are consistent with Grade I diastolic dysfunction (impaired relaxation).  2. Right ventricular systolic function is normal. The right ventricular size is normal. There is normal pulmonary artery systolic pressure.  3. The mitral valve is normal in structure. Trivial mitral valve regurgitation.  4. The aortic valve is tricuspid. Aortic valve regurgitation is not visualized. No aortic stenosis is present.  5. The inferior vena cava is normal in size with greater than 50% respiratory variability, suggesting right atrial pressure of 3 mmHg. Comparison(s): No prior Echocardiogram. FINDINGS  Left Ventricle: Left ventricular ejection fraction, by estimation, is >75%. The left ventricle has hyperdynamic function. The left ventricle has no regional wall motion  abnormalities. The left ventricular internal cavity size was normal in size. There is mild concentric left ventricular hypertrophy. Left ventricular diastolic parameters are consistent with Grade I diastolic dysfunction (impaired relaxation). Right Ventricle: The right ventricular size is normal. No increase in right ventricular wall thickness. Right ventricular systolic function is normal. There is normal pulmonary artery systolic pressure. The tricuspid regurgitant velocity is 2.51 m/s, and  with an assumed right atrial pressure of 3 mmHg, the estimated right ventricular systolic pressure is 28.2 mmHg. Left Atrium: Left atrial size was normal in size. Right Atrium: Right atrial size was normal in size. Pericardium: There is no evidence of pericardial effusion. Mitral Valve: The mitral valve is normal in structure. There is mild thickening of the mitral valve leaflet(s). Trivial mitral valve regurgitation. Tricuspid Valve: The tricuspid valve is normal in structure. Tricuspid valve regurgitation is trivial. Aortic Valve: The aortic valve is tricuspid. Aortic valve regurgitation is not visualized.  No aortic stenosis is present. Pulmonic Valve: The pulmonic valve was normal in structure. Pulmonic valve regurgitation is not visualized. Aorta: The aortic root and ascending aorta are structurally normal, with no evidence of dilitation. Venous: The inferior vena cava is normal in size with greater than 50% respiratory variability, suggesting right atrial pressure of 3 mmHg. IAS/Shunts: No atrial level shunt detected by color flow Doppler.  LEFT VENTRICLE PLAX 2D LVIDd:         3.70 cm  Diastology LVIDs:         1.70 cm  LV e' medial:    7.07 cm/s LV PW:         1.20 cm  LV E/e' medial:  15.3 LV IVS:        1.00 cm  LV e' lateral:   11.20 cm/s LVOT diam:     1.90 cm  LV E/e' lateral: 9.6 LV SV:         81 LV SV Index:   47 LVOT Area:     2.84 cm  RIGHT VENTRICLE             IVC RV Basal diam:  2.50 cm     IVC diam: 1.80  cm RV S prime:     14.50 cm/s TAPSE (M-mode): 2.8 cm LEFT ATRIUM             Index       RIGHT ATRIUM           Index LA diam:        3.70 cm 2.14 cm/m  RA Area:     13.20 cm LA Vol (A2C):   35.6 ml 20.63 ml/m RA Volume:   30.60 ml  17.73 ml/m LA Vol (A4C):   32.1 ml 18.60 ml/m LA Biplane Vol: 34.1 ml 19.76 ml/m  AORTIC VALVE LVOT Vmax:   130.00 cm/s LVOT Vmean:  91.000 cm/s LVOT VTI:    0.284 m  AORTA Ao Root diam: 2.60 cm Ao Asc diam:  2.70 cm MITRAL VALVE                TRICUSPID VALVE MV Area (PHT): 3.27 cm     TR Peak grad:   25.2 mmHg MV Decel Time: 232 msec     TR Vmax:        251.00 cm/s MV E velocity: 108.00 cm/s MV A velocity: 129.00 cm/s  SHUNTS MV E/A ratio:  0.84         Systemic VTI:  0.28 m                             Systemic Diam: 1.90 cm Laurance Flatten MD Electronically signed by Laurance Flatten MD Signature Date/Time: 09/18/2020/2:52:51 PM    Final    VAS [place] LOWER EXTREMITY VENOUS (DVT)  Result Date: 09/22/2020  Lower Venous DVT Study Indications: Edema. Other Indications: DKA,. Limitations: Altered mental status, would not follow directions. Comparison Study: No prior study on file Performing Technologist: Sherren Kerns RVS  Examination Guidelines: A complete evaluation includes B-mode imaging, spectral Doppler, color Doppler, and power Doppler as needed of all accessible portions of each vessel. Bilateral testing is considered an integral part of a complete examination. Limited examinations for reoccurring indications may be performed as noted. The reflux portion of the exam is performed with the patient in reverse Trendelenburg.  +---------+---------------+---------+-----------+----------+-------------------+ RIGHT    CompressibilityPhasicitySpontaneityPropertiesThrombus Aging      +---------+---------------+---------+-----------+----------+-------------------+ CFV  Full           Yes      Yes                                       +---------+---------------+---------+-----------+----------+-------------------+ SFJ      Full                                                             +---------+---------------+---------+-----------+----------+-------------------+ FV Prox  Full                                                             +---------+---------------+---------+-----------+----------+-------------------+ FV Mid   Full                                                             +---------+---------------+---------+-----------+----------+-------------------+ FV DistalFull                                                             +---------+---------------+---------+-----------+----------+-------------------+ PFV      Full                                                             +---------+---------------+---------+-----------+----------+-------------------+ POP                     Yes      Yes                  patent by color and                                                       Doppler             +---------+---------------+---------+-----------+----------+-------------------+ PTV                                                   Not well visualized +---------+---------------+---------+-----------+----------+-------------------+ PERO  Not well visualized +---------+---------------+---------+-----------+----------+-------------------+   +---------+---------------+---------+-----------+----------+-------------------+ LEFT     CompressibilityPhasicitySpontaneityPropertiesThrombus Aging      +---------+---------------+---------+-----------+----------+-------------------+ CFV      Full           Yes      Yes                                      +---------+---------------+---------+-----------+----------+-------------------+ SFJ      Full                                                              +---------+---------------+---------+-----------+----------+-------------------+ FV Prox  Full                                                             +---------+---------------+---------+-----------+----------+-------------------+ FV Mid   Full                                                             +---------+---------------+---------+-----------+----------+-------------------+ FV DistalFull                                                             +---------+---------------+---------+-----------+----------+-------------------+ PFV      Full                                                             +---------+---------------+---------+-----------+----------+-------------------+ POP                     Yes      Yes                  patent by color and                                                       Doppler             +---------+---------------+---------+-----------+----------+-------------------+ PTV                                                   patent by color and  Doppler             +---------+---------------+---------+-----------+----------+-------------------+ PERO                                                  Not well visualized +---------+---------------+---------+-----------+----------+-------------------+     Summary: RIGHT: - There is no evidence of deep vein thrombosis in the lower extremity. However, portions of this examination were limited- see technologist comments above.  LEFT: - There is no evidence of deep vein thrombosis in the lower extremity. However, portions of this examination were limited- see technologist comments above.  *See table(s) above for measurements and observations. Electronically signed by Fabienne Bruns MD on 09/22/2020 at 9:13:05 PM.    Final      Subjective: No acute issues/events overnight   Discharge Exam: Vitals:   09/29/20  2039 09/30/20 0615  BP: 140/73 139/70  Pulse: 85 86  Resp: 16 18  Temp: 97.9 F (36.6 C) 98 F (36.7 C)  SpO2: 100% 97%   Vitals:   09/29/20 0531 09/29/20 1130 09/29/20 2039 09/30/20 0615  BP: (!) 123/58 136/65 140/73 139/70  Pulse: 94 94 85 86  Resp: 16  16 18   Temp: 98.6 F (37 C) 98.3 F (36.8 C) 97.9 F (36.6 C) 98 F (36.7 C)  TempSrc: Oral Oral Oral Oral  SpO2: 95% 98% 100% 97%  Weight:      Height:        General: Pt is alert, awake, not in acute distress Cardiovascular: RRR, S1/S2 +, no rubs, no gallops Respiratory: CTA bilaterally, no wheezing, no rhonchi Abdominal: Soft, NT, ND, bowel sounds + Extremities: no edema, no cyanosis    The results of significant diagnostics from this hospitalization (including imaging, microbiology, ancillary and laboratory) are listed below for reference.     Microbiology: Recent Results (from the past 240 hour(s))  SARS CORONAVIRUS 2 (TAT 6-24 HRS) Nasopharyngeal Nasopharyngeal Swab     Status: None   Collection Time: 09/29/20 11:15 AM   Specimen: Nasopharyngeal Swab  Result Value Ref Range Status   SARS Coronavirus 2 NEGATIVE NEGATIVE Final    Comment: (NOTE) SARS-CoV-2 target nucleic acids are NOT DETECTED.  The SARS-CoV-2 RNA is generally detectable in upper and lower respiratory specimens during the acute phase of infection. Negative results do not preclude SARS-CoV-2 infection, do not rule out co-infections with other pathogens, and should not be used as the sole basis for treatment or other patient management decisions. Negative results must be combined with clinical observations, patient history, and epidemiological information. The expected result is Negative.  Fact Sheet for Patients: 10/01/20  Fact Sheet for Healthcare Providers: HairSlick.no  This test is not yet approved or cleared by the [employer]ier[employer]irigir.[employer] FDA and  has been authorized for  detection and/or diagnosis of SARS-CoV-2 by FDA under an Emergency Use Authorization (EUA). This EUA will remain  in effect (meaning this test can be used) for the duration of the COVID-19 declaration under Se ction 564(b)(1) of the Act, 21 U.S.C. section 360bbb-3(b)(1), unless the authorization is terminated or revoked sooner.  Performed at [facility], [address] [place], [place]      Labs: BNP (last 3 results) No results for input(s): BNP in the last 8760 hours. Basic Metabolic Panel: Recent Labs  Lab 09/24/20 0249 09/25/20 0257 09/25/20 0409 09/27/20 0620 09/28/20  1610 09/28/20 2121  NA 138 139 138 140 137  --   K 3.4* 6.2* 3.5 3.8 3.8  --   CL 106 106 105 107 105  --   CO2 23 26 24 22 25   --   GLUCOSE 114* 124* 119* 130* 276* 519*  BUN 5* 6* 7* 10 12  --   CREATININE 0.55 0.78 0.61 0.58 0.71  --   CALCIUM 9.1 9.1 9.0 9.2 9.1  --    Liver Function Tests: Recent Labs  Lab 09/25/20 0257 09/25/20 0409 09/27/20 0620 09/28/20 0833  AST 54* 22 24 18   ALT 23 24 21 16   ALKPHOS 62 64 63 58  BILITOT 1.8* 0.7 0.8 0.8  PROT 6.3* 6.2* 6.3* 5.8*  ALBUMIN 3.3* 3.3* 3.2* 3.2*   No results for input(s): LIPASE, AMYLASE in the last 168 hours. No results for input(s): AMMONIA in the last 168 hours. CBC: Recent Labs  Lab 09/25/20 0257 09/27/20 0620 09/28/20 0833  WBC 7.6 6.9 6.0  HGB 11.9* 11.8* 11.4*  HCT 38.2 37.4 36.2  MCV 80.4 78.7* 80.1  PLT 160 190 186   Cardiac Enzymes: No results for input(s): CKTOTAL, CKMB, CKMBINDEX, TROPONINI in the last 168 hours. BNP: Invalid input(s): POCBNP CBG: Recent Labs  Lab 09/29/20 0759 09/29/20 1125 09/29/20 1647 09/29/20 2042 09/30/20 0722  GLUCAP 182* 302* 261* 261* 129*   D-Dimer No results for input(s): DDIMER in the last 72 hours. Hgb A1c No results for input(s): HGBA1C in the last 72 hours. Lipid Profile No results for input(s): CHOL, HDL, LDLCALC, TRIG, CHOLHDL, LDLDIRECT in the last 72  hours. Thyroid function studies No results for input(s): TSH, T4TOTAL, T3FREE, THYROIDAB in the last 72 hours.  Invalid input(s): FREET3 Anemia work up No results for input(s): VITAMINB12, FOLATE, FERRITIN, TIBC, IRON, RETICCTPCT in the last 72 hours. Urinalysis    Component Value Date/Time   COLORURINE STRAW (A) 09/17/2020 1932   APPEARANCEUR CLEAR 09/17/2020 1932   LABSPEC 1.024 09/17/2020 1932   PHURINE 5.0 09/17/2020 1932   GLUCOSEU >=500 (A) 09/17/2020 1932   HGBUR SMALL (A) 09/17/2020 1932   BILIRUBINUR NEGATIVE 09/17/2020 1932   KETONESUR 80 (A) 09/17/2020 1932   PROTEINUR NEGATIVE 09/17/2020 1932   NITRITE NEGATIVE 09/17/2020 1932   LEUKOCYTESUR NEGATIVE 09/17/2020 1932   Sepsis Labs Invalid input(s): PROCALCITONIN,  WBC,  LACTICIDVEN Microbiology Recent Results (from the past 240 hour(s))  SARS CORONAVIRUS 2 (TAT 6-24 HRS) Nasopharyngeal Nasopharyngeal Swab     Status: None   Collection Time: 09/29/20 11:15 AM   Specimen: Nasopharyngeal Swab  Result Value Ref Range Status   SARS Coronavirus 2 NEGATIVE NEGATIVE Final    Comment: (NOTE) SARS-CoV-2 target nucleic acids are NOT DETECTED.  The SARS-CoV-2 RNA is generally detectable in upper and lower respiratory specimens during the acute phase of infection. Negative results do not preclude SARS-CoV-2 infection, do not rule out co-infections with other pathogens, and should not be used as the sole basis for treatment or other patient management decisions. Negative results must be combined with clinical observations, patient history, and epidemiological information. The expected result is Negative.  Fact Sheet for Patients: HairSlick.no  Fact Sheet for Healthcare Providers: quierodirigir.com  This test is not yet approved or cleared by the [place] FDA and  has been authorized for detection and/or diagnosis of SARS-CoV-2 by FDA under an Emergency Use  Authorization (EUA). This EUA will remain  in effect (meaning this test can be used) for the duration of the  COVID-19 declaration under Se ction 564(b)(1) of the Act, 21 U.S.C. section 360bbb-3(b)(1), unless the authorization is terminated or revoked sooner.  Performed at [facility], [address], [place], [place] [address]      Time coordinating discharge: Over 30 minutes  SIGNED:   Azucena Fallen, DO [facility] 09/30/2020, 10:20 AM Pager   If 7PM-7AM, please contact night-coverage [email or web address]

## 2020-09-30 LAB — GLUCOSE, CAPILLARY
Glucose-Capillary: 129 mg/dL — ABNORMAL HIGH (ref 70–99)
Glucose-Capillary: 86 mg/dL (ref 70–99)
Glucose-Capillary: 150 mg/dL — ABNORMAL HIGH (ref 70–99)

## 2020-09-30 MED ORDER — AMLODIPINE BESYLATE 5 MG PO TABS: 5.0000 mg | ORAL_TABLET | Freq: Every day | ORAL | 0 refills | Status: DC

## 2020-09-30 MED ORDER — INSULIN GLARGINE 100 UNIT/ML ~~LOC~~ SOLN: 15.0000 [IU] | Freq: Every day | SUBCUTANEOUS | 0 refills | Status: DC

## 2020-09-30 MED ORDER — GLUCERNA SHAKE PO LIQD: 237.0000 mL | Freq: Two times a day (BID) | ORAL | 0 refills | Status: AC

## 2020-09-30 NOTE — Progress Notes (Signed)
Noted pt is very confused and aggressive. PT IS NOTED TO have attempted to remove midline and is calling for a family member. Pt is re-oriented and continues to state she is going to remove IV. Informed pt she would have to be restrained fort her safety and IV line replaced. PT states she will not let that happen. Pt encouraged to just leave line in as she is being discharged in the am. On going assessment

## 2020-09-30 NOTE — Plan of Care (Signed)
  Problem: Safety: Goal: Non-violent Restraint(s) Outcome: Adequate for Discharge   Problem: Education: Goal: Knowledge of General Education information will improve Description: Including pain rating scale, medication(s)/side effects and non-pharmacologic comfort measures Outcome: Adequate for Discharge   Problem: Health Behavior/Discharge Planning: Goal: Ability to manage health-related needs will improve Outcome: Adequate for Discharge   Problem: Clinical Measurements: Goal: Ability to maintain clinical measurements within normal limits will improve Outcome: Adequate for Discharge Goal: Will remain free from infection Outcome: Adequate for Discharge Goal: Diagnostic test results will improve Outcome: Adequate for Discharge Goal: Respiratory complications will improve Outcome: Adequate for Discharge Goal: Cardiovascular complication will be avoided Outcome: Adequate for Discharge   Problem: Activity: Goal: Risk for activity intolerance will decrease Outcome: Adequate for Discharge   Problem: Nutrition: Goal: Adequate nutrition will be maintained Outcome: Adequate for Discharge   Problem: Coping: Goal: Level of anxiety will decrease Outcome: Adequate for Discharge   Problem: Elimination: Goal: Will not experience complications related to bowel motility Outcome: Adequate for Discharge Goal: Will not experience complications related to urinary retention Outcome: Adequate for Discharge   Problem: Pain Managment: Goal: General experience of comfort will improve Outcome: Adequate for Discharge   Problem: Safety: Goal: Ability to remain free from injury will improve Outcome: Adequate for Discharge   Problem: Skin Integrity: Goal: Risk for impaired skin integrity will decrease Outcome: Adequate for Discharge   Problem: Education: Goal: Ability to describe self-care measures that may prevent or decrease complications (Diabetes Survival Skills Education) will  improve Outcome: Adequate for Discharge Goal: Individualized Educational Video(s) Outcome: Adequate for Discharge   Problem: Cardiac: Goal: Ability to maintain an adequate cardiac output will improve Outcome: Adequate for Discharge   Problem: Health Behavior/Discharge Planning: Goal: Ability to identify and utilize available resources and services will improve Outcome: Adequate for Discharge Goal: Ability to manage health-related needs will improve Outcome: Adequate for Discharge   Problem: Fluid Volume: Goal: Ability to achieve a balanced intake and output will improve Outcome: Adequate for Discharge   Problem: Metabolic: Goal: Ability to maintain appropriate glucose levels will improve Outcome: Adequate for Discharge   Problem: Nutritional: Goal: Maintenance of adequate nutrition will improve Outcome: Adequate for Discharge Goal: Maintenance of adequate weight for body size and type will improve Outcome: Adequate for Discharge   Problem: Respiratory: Goal: Will regain and/or maintain adequate ventilation Outcome: Adequate for Discharge   Problem: Urinary Elimination: Goal: Ability to achieve and maintain adequate renal perfusion and functioning will improve Outcome: Adequate for Discharge

## 2020-09-30 NOTE — TOC Progression Note (Signed)
Transition of Care [facility]) - Progression Note    Patient Details  Name: Donna Harrell MRN: [id number] Date of Birth: 09/20/1955  Transition of Care [facility]) CM/SW Contact  Armanda Heritage, RN Phone Number: 09/30/2020, 11:13 AM  Clinical Narrative:    CM confirmed bed availability at [facility] for today.  CM confirmed with patient's sister that patient has been Vaccinated and received a booster for Covid-19 (sister has copy of card and con provide to facility if needed).  Sister notified of intent to dc today.  Patient will discharge to room B15 bed 2 at [facility], bedside RN please call report to [phone].  [facility] transportation arranged.    Expected Discharge Plan: Skilled Nursing Facility Barriers to Discharge: No Barriers Identified  Expected Discharge Plan and Services Expected Discharge Plan: Skilled Nursing Facility   Discharge Planning Services: CM Consult   Living arrangements for the past 2 months: Apartment Expected Discharge Date: 09/30/20                                     Social Determinants of Health (SDOH) Interventions    Readmission Risk Interventions No flowsheet data found.

## 2020-10-03 ENCOUNTER — Other Ambulatory Visit: Payer: Self-pay

## 2020-10-03 ENCOUNTER — Emergency Department (HOSPITAL_COMMUNITY): Payer: Medicare Other

## 2020-10-03 ENCOUNTER — Encounter (HOSPITAL_COMMUNITY): Payer: Self-pay

## 2020-10-03 ENCOUNTER — Inpatient Hospital Stay (HOSPITAL_COMMUNITY)
Admission: EM | Admit: 2020-10-03 | Discharge: 2020-10-05 | DRG: 637 | Disposition: A | Discharge status: Skilled Nursing Facility | Payer: Medicare Other | Source: Skilled Nursing Facility | Attending: Family Medicine | Admitting: Family Medicine

## 2020-10-03 DIAGNOSIS — G9340 Encephalopathy, unspecified: Secondary | ICD-10-CM

## 2020-10-03 DIAGNOSIS — F32A Depression, unspecified: Secondary | ICD-10-CM | POA: Diagnosis present

## 2020-10-03 DIAGNOSIS — E1010 Type 1 diabetes mellitus with ketoacidosis without coma: Principal | ICD-10-CM | POA: Diagnosis present

## 2020-10-03 DIAGNOSIS — Z87891 Personal history of nicotine dependence: Secondary | ICD-10-CM

## 2020-10-03 DIAGNOSIS — Z794 Long term (current) use of insulin: Secondary | ICD-10-CM

## 2020-10-03 DIAGNOSIS — Z8673 Personal history of transient ischemic attack (TIA), and cerebral infarction without residual deficits: Secondary | ICD-10-CM

## 2020-10-03 DIAGNOSIS — I10 Essential (primary) hypertension: Secondary | ICD-10-CM | POA: Diagnosis present

## 2020-10-03 DIAGNOSIS — E1110 Type 2 diabetes mellitus with ketoacidosis without coma: Secondary | ICD-10-CM | POA: Diagnosis present

## 2020-10-03 DIAGNOSIS — R627 Adult failure to thrive: Secondary | ICD-10-CM | POA: Diagnosis present

## 2020-10-03 DIAGNOSIS — E860 Dehydration: Secondary | ICD-10-CM | POA: Diagnosis present

## 2020-10-03 DIAGNOSIS — K118 Other diseases of salivary glands: Secondary | ICD-10-CM | POA: Diagnosis present

## 2020-10-03 DIAGNOSIS — N179 Acute kidney failure, unspecified: Secondary | ICD-10-CM | POA: Diagnosis present

## 2020-10-03 DIAGNOSIS — Z6821 Body mass index (BMI) 21.0-21.9, adult: Secondary | ICD-10-CM

## 2020-10-03 DIAGNOSIS — Z66 Do not resuscitate: Secondary | ICD-10-CM | POA: Diagnosis present

## 2020-10-03 DIAGNOSIS — Z823 Family history of stroke: Secondary | ICD-10-CM

## 2020-10-03 DIAGNOSIS — Z79899 Other long term (current) drug therapy: Secondary | ICD-10-CM

## 2020-10-03 DIAGNOSIS — G9341 Metabolic encephalopathy: Secondary | ICD-10-CM | POA: Diagnosis not present

## 2020-10-03 DIAGNOSIS — Z7902 Long term (current) use of antithrombotics/antiplatelets: Secondary | ICD-10-CM

## 2020-10-03 DIAGNOSIS — E43 Unspecified severe protein-calorie malnutrition: Secondary | ICD-10-CM | POA: Diagnosis present

## 2020-10-03 DIAGNOSIS — E1069 Type 1 diabetes mellitus with other specified complication: Secondary | ICD-10-CM | POA: Diagnosis not present

## 2020-10-03 DIAGNOSIS — Z20822 Contact with and (suspected) exposure to covid-19: Secondary | ICD-10-CM | POA: Diagnosis present

## 2020-10-03 LAB — BASIC METABOLIC PANEL
Anion gap: 28 — ABNORMAL HIGH (ref 5–15)
Anion gap: 21 — ABNORMAL HIGH (ref 5–15)
Anion gap: 13 (ref 5–15)
Anion gap: 9 (ref 5–15)
BUN: 23 mg/dL (ref 8–23)
BUN: 21 mg/dL (ref 8–23)
BUN: 17 mg/dL (ref 8–23)
BUN: 15 mg/dL (ref 8–23)
CO2: 9 mmol/L — ABNORMAL LOW (ref 22–32)
CO2: 12 mmol/L — ABNORMAL LOW (ref 22–32)
CO2: 18 mmol/L — ABNORMAL LOW (ref 22–32)
CO2: 20 mmol/L — ABNORMAL LOW (ref 22–32)
Calcium: 10.2 mg/dL (ref 8.9–10.3)
Calcium: 9.8 mg/dL (ref 8.9–10.3)
Calcium: 9.3 mg/dL (ref 8.9–10.3)
Calcium: 9 mg/dL (ref 8.9–10.3)
Chloride: 100 mmol/L (ref 98–111)
Chloride: 105 mmol/L (ref 98–111)
Chloride: 109 mmol/L (ref 98–111)
Chloride: 109 mmol/L (ref 98–111)
Creatinine, Ser: 1.84 mg/dL — ABNORMAL HIGH (ref 0.44–1.00)
Creatinine, Ser: 1.51 mg/dL — ABNORMAL HIGH (ref 0.44–1.00)
Creatinine, Ser: 1 mg/dL (ref 0.44–1.00)
Creatinine, Ser: 0.9 mg/dL (ref 0.44–1.00)
GFR, Estimated: 31 mL/min — ABNORMAL LOW (ref >60)
GFR, Estimated: 39 mL/min — ABNORMAL LOW (ref >60)
GFR, Estimated: >60 mL/min (ref >60)
GFR, Estimated: >60 mL/min (ref >60)
Glucose, Bld: 591 mg/dL (ref 70–99)
Glucose, Bld: 379 mg/dL — ABNORMAL HIGH (ref 70–99)
Glucose, Bld: 180 mg/dL — ABNORMAL HIGH (ref 70–99)
Glucose, Bld: 171 mg/dL — ABNORMAL HIGH (ref 70–99)
Potassium: 4.9 mmol/L (ref 3.5–5.1)
Potassium: 4 mmol/L (ref 3.5–5.1)
Potassium: 4.7 mmol/L (ref 3.5–5.1)
Potassium: 4.4 mmol/L (ref 3.5–5.1)
Sodium: 137 mmol/L (ref 135–145)
Sodium: 138 mmol/L (ref 135–145)
Sodium: 140 mmol/L (ref 135–145)
Sodium: 138 mmol/L (ref 135–145)

## 2020-10-03 LAB — CBC WITH DIFFERENTIAL/PLATELET
Abs Immature Granulocytes: 0.51 10*3/uL — ABNORMAL HIGH (ref 0.00–0.07)
Basophils Absolute: 0.1 10*3/uL (ref 0.0–0.1)
Basophils Relative: 0 %
Eosinophils Absolute: 0 10*3/uL (ref 0.0–0.5)
Eosinophils Relative: 0 %
HCT: 44.2 % (ref 36.0–46.0)
Hemoglobin: 12.9 g/dL (ref 12.0–15.0)
Immature Granulocytes: 2 %
Lymphocytes Relative: 5 %
Lymphs Abs: 1.5 10*3/uL (ref 0.7–4.0)
MCH: 25.2 pg — ABNORMAL LOW (ref 26.0–34.0)
MCHC: 29.2 g/dL — ABNORMAL LOW (ref 30.0–36.0)
MCV: 86.5 fL (ref 80.0–100.0)
Monocytes Absolute: 1.2 10*3/uL — ABNORMAL HIGH (ref 0.1–1.0)
Monocytes Relative: 4 %
Neutro Abs: 28.2 10*3/uL — ABNORMAL HIGH (ref 1.7–7.7)
Neutrophils Relative %: 89 %
Platelets: 264 10*3/uL (ref 150–400)
RBC: 5.11 MIL/uL (ref 3.87–5.11)
RDW: 15.1 % (ref 11.5–15.5)
WBC: 31.4 10*3/uL — ABNORMAL HIGH (ref 4.0–10.5)
nRBC: 0 % (ref 0.0–0.2)

## 2020-10-03 LAB — URINALYSIS, ROUTINE W REFLEX MICROSCOPIC
Bacteria, UA: NONE SEEN
Bilirubin Urine: NEGATIVE
Glucose, UA: >=500 mg/dL — AB
Hgb urine dipstick: NEGATIVE
Ketones, ur: 80 mg/dL — AB
Leukocytes,Ua: NEGATIVE
Nitrite: NEGATIVE
Protein, ur: NEGATIVE mg/dL
Specific Gravity, Urine: 1.018 (ref 1.005–1.030)
pH: 5 (ref 5.0–8.0)

## 2020-10-03 LAB — HEPATIC FUNCTION PANEL
ALT: 23 U/L (ref 0–44)
AST: 31 U/L (ref 15–41)
Albumin: 4.1 g/dL (ref 3.5–5.0)
Alkaline Phosphatase: 84 U/L (ref 38–126)
Bilirubin, Direct: 0.1 mg/dL (ref 0.0–0.2)
Indirect Bilirubin: 1.4 mg/dL — ABNORMAL HIGH (ref 0.3–0.9)
Total Bilirubin: 1.5 mg/dL — ABNORMAL HIGH (ref 0.3–1.2)
Total Protein: 7.9 g/dL (ref 6.5–8.1)

## 2020-10-03 LAB — BLOOD GAS, VENOUS
Acid-base deficit: 19.5 mmol/L — ABNORMAL HIGH (ref 0.0–2.0)
Bicarbonate: 9.5 mmol/L — ABNORMAL LOW (ref 20.0–28.0)
FIO2: 32
O2 Saturation: 48.4 %
Patient temperature: 37
pCO2, Ven: 24.7 mmHg — ABNORMAL LOW (ref 44.0–60.0)
pH, Ven: 7.131 — CL (ref 7.250–7.430)
pO2, Ven: 33.6 mmHg (ref 32.0–45.0)

## 2020-10-03 LAB — GLUCOSE, CAPILLARY
Glucose-Capillary: 164 mg/dL — ABNORMAL HIGH (ref 70–99)
Glucose-Capillary: 170 mg/dL — ABNORMAL HIGH (ref 70–99)
Glucose-Capillary: 168 mg/dL — ABNORMAL HIGH (ref 70–99)
Glucose-Capillary: 148 mg/dL — ABNORMAL HIGH (ref 70–99)

## 2020-10-03 LAB — RESP PANEL BY RT-PCR (FLU A&B, COVID) ARPGX2
Influenza A by PCR: NEGATIVE
Influenza B by PCR: NEGATIVE
SARS Coronavirus 2 by RT PCR: NEGATIVE

## 2020-10-03 LAB — MRSA PCR SCREENING: MRSA by PCR: NEGATIVE

## 2020-10-03 LAB — CBG MONITORING, ED
Glucose-Capillary: >600 mg/dL (ref 70–99)
Glucose-Capillary: 431 mg/dL — ABNORMAL HIGH (ref 70–99)
Glucose-Capillary: 338 mg/dL — ABNORMAL HIGH (ref 70–99)
Glucose-Capillary: 353 mg/dL — ABNORMAL HIGH (ref 70–99)
Glucose-Capillary: 196 mg/dL — ABNORMAL HIGH (ref 70–99)
Glucose-Capillary: 161 mg/dL — ABNORMAL HIGH (ref 70–99)
Glucose-Capillary: 168 mg/dL — ABNORMAL HIGH (ref 70–99)

## 2020-10-03 LAB — BETA-HYDROXYBUTYRIC ACID
Beta-Hydroxybutyric Acid: >8 mmol/L — ABNORMAL HIGH (ref 0.05–0.27)
Beta-Hydroxybutyric Acid: 2.82 mmol/L — ABNORMAL HIGH (ref 0.05–0.27)

## 2020-10-03 LAB — LACTIC ACID, PLASMA
Lactic Acid, Venous: 4.9 mmol/L (ref 0.5–1.9)
Lactic Acid, Venous: 2.5 mmol/L (ref 0.5–1.9)

## 2020-10-03 MED ORDER — POTASSIUM CHLORIDE 10 MEQ/100ML IV SOLN
INTRAVENOUS | Status: AC
  Administered 2020-10-03: 10 meq via INTRAVENOUS
  Filled 2020-10-03: qty 100

## 2020-10-03 MED ORDER — CHLORHEXIDINE GLUCONATE CLOTH 2 % EX PADS
6.0000 | MEDICATED_PAD | Freq: Every day | CUTANEOUS | Status: DC
  Administered 2020-10-03 – 2020-10-04 (×2): 6 via TOPICAL

## 2020-10-03 MED ORDER — DEXTROSE IN LACTATED RINGERS 5 % IV SOLN: INTRAVENOUS | Status: DC

## 2020-10-03 MED ORDER — INSULIN REGULAR(HUMAN) IN NACL 100-0.9 UT/100ML-% IV SOLN
INTRAVENOUS | Status: DC
  Administered 2020-10-03: 8.5 [IU]/h via INTRAVENOUS
  Filled 2020-10-03 (×2): qty 100

## 2020-10-03 MED ORDER — LACTATED RINGERS IV BOLUS
20.0000 mL/kg | Freq: Once | INTRAVENOUS | Status: AC
  Administered 2020-10-03: 1300 mL via INTRAVENOUS

## 2020-10-03 MED ORDER — POTASSIUM CHLORIDE 10 MEQ/100ML IV SOLN
10.0000 meq | Freq: Once | INTRAVENOUS | Status: AC
  Filled 2020-10-03: qty 100

## 2020-10-03 MED ORDER — INSULIN ASPART 100 UNIT/ML ~~LOC~~ SOLN
20.0000 [IU] | Freq: Once | SUBCUTANEOUS | Status: AC
  Administered 2020-10-03: 20 [IU] via SUBCUTANEOUS

## 2020-10-03 MED ORDER — ONDANSETRON 4 MG PO TBDP: 4.0000 mg | ORAL_TABLET | Freq: Once | ORAL | Status: DC

## 2020-10-03 MED ORDER — SODIUM CHLORIDE 0.9 % IV BOLUS (SEPSIS)
1000.0000 mL | Freq: Once | INTRAVENOUS | Status: AC
  Administered 2020-10-03: 1000 mL via INTRAVENOUS

## 2020-10-03 MED ORDER — ORAL CARE MOUTH RINSE
15.0000 mL | Freq: Two times a day (BID) | OROMUCOSAL | Status: DC
  Administered 2020-10-03 – 2020-10-05 (×4): 15 mL via OROMUCOSAL

## 2020-10-03 MED ORDER — LACTATED RINGERS IV SOLN: INTRAVENOUS | Status: DC

## 2020-10-03 MED ORDER — SODIUM CHLORIDE 0.9 % IV BOLUS
1000.0000 mL | Freq: Once | INTRAVENOUS | Status: AC
  Administered 2020-10-03: 1000 mL via INTRAVENOUS

## 2020-10-03 MED ORDER — POTASSIUM CHLORIDE 10 MEQ/100ML IV SOLN
10.0000 meq | Freq: Once | INTRAVENOUS | Status: AC
  Administered 2020-10-03: 10 meq via INTRAVENOUS

## 2020-10-03 MED ORDER — DEXTROSE 50 % IV SOLN
0.0000 mL | INTRAVENOUS | Status: DC | PRN
  Filled 2020-10-03: qty 50

## 2020-10-03 NOTE — ED Notes (Signed)
Verbal order for bolus NS.

## 2020-10-03 NOTE — ED Notes (Signed)
Patient is resting comfortably. 

## 2020-10-03 NOTE — ED Notes (Signed)
Date and time results received: 10/03/20 & 1124hrs   Test: Lactic Acid  Critical Value: 4.9  Name of Provider Notified: DR. Marisa Severin  Orders Received? Or Actions Taken?: Notified

## 2020-10-03 NOTE — H&P (Signed)
Patient Demographics:    Donna Harrell, is a 65 y.o. female  MRN: [id number]   DOB - April 04, 1956  Admit Date - 10/03/2020  Outpatient Primary MD for the patient is Pcp, No   Assessment & Plan:    Principal Problem:   DKA (diabetic ketoacidosis) (Donna Harrell) Active Problems:   Type 1 diabetes mellitus with other specified complication (Donna Harrell)   Acute metabolic encephalopathy   Depression   Essential hypertension   1)DKA--patient met DKA criteria on admission with a bicarb of 9, anion gap of 28, serum glucose of 591 and beta hydroxybutyric acid of >> 8.0 -Treat with aggressive IV fluids, IV insulin, glucose check, frequent BMP and beta hydroxybutyric acid checks per Endo tool protocol -May switch from LR/NS to D5 solution when glucose drops below 250, per Endo tool protocol -Last A1c was 7.1 reflecting uncontrolled DM with Hyperglycemia- - 2)AKI----acute kidney injury due to dehydration in the setting of DKA --  creatinine on admission=1.81  , baseline creatinine = 0.71 ,  - renally adjust medications, avoid nephrotoxic agents / dehydration  / hypotension  3)Leukocytosis and Lactic Acidosis--- suspect dehydration and reactive due to DKA--- -Lactic acid improved to 2.5 from 4.9 after IV fluids --get blood cultures - 4)Acute Metabolic Encephalopathy--- multifactorial, metabolic derangement and major contributing factor  5)Hyperdense right globe - Likely from surgery in the past.  - Patient's right eyeball was found to be hyperdense on CT head.  -As per patient's sister pt had 2 surgeries on right eye but her vision could not be restored  6)Parotid Gland Lesion, incidental finding - MRI shows 1.6 cm lesion within the superficial lobe of the left parotid gland, partially visualized. Could be artifact versus primary  neoplasm. - Dedicated CT soft tissue neck with contrast suggested when patient is more able to tolerate - will likely need to be an outpatient workup at this point.  7)Failure to thrive - severe protein caloric malnutrition-- get nutritional consult  Disposition/Need for in-Hospital Stay- patient unable to be discharged at this time due to --- DKA requiring IV insulin and IV fluids  Dispo: The patient is from: SNF              Anticipated d/c is to: SNF              Anticipated d/c date is: 2 days              Patient currently is not medically stable to d/c. Barriers: Not Clinically Stable-   With History of - Reviewed by me  Past Harrell History:  Diagnosis Date  . Diabetes (Donna Harrell)   . 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 Hypertension   . Legally blind    per sister  . TIA (transient ischemic attack)    per sister      Past Surgical History:  Procedure Laterality Date  . 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 Emesis      HPI:    Donna Harrell  is a 65 y.o. female with Harrell history significant of DM1, depression, hypertension who is originally from [facility] discharge to SNF from [facility]rell on 09/30/2020--- brought in from SNF facility today due to concern for altered mentation/confusion and found to have elevated glucose and pathophysiology consistent with DKA --Patient is very altered and a poor historian - bicarb of 9, anion gap of 28, serum glucose of 591 and beta hydroxybutyric acid of >> 8.0 -Creatinine is up to 1.81 -Chest x-ray without acute pulmonary findings -Patient started on IV insulin IV fluids and Endo tool protocol -Lactic acid improved to 2.5 from 4.9 after IV fluids  -No Reported fevers or difficulty breathing-- Additional hx obtained from sister Ms Donna Harrell by phone-   Review of systems:     In addition to the HPI above,   A full Review of  Systems was done, all other systems reviewed are negative except as noted above in HPI , .   Social History:  Reviewed by me    Social History   Tobacco Use  . Smoking status: Former Research scientist (life sciences)  . Smokeless tobacco: Never Used  . Tobacco comment: quit 20 years ago  Substance Use Topics  . Alcohol use: Not Currently     Family History :  Reviewed by me   Family History  Problem Relation Age of Onset  . High blood pressure Mother   . High blood pressure Sister   . Stroke Sister     Home Medications:   Prior to Admission medications   Medication Sig Start Date End Date Taking? Authorizing Provider  amLODipine (NORVASC) 5 MG tablet Take 1 tablet (5 mg total) by mouth daily. 09/30/20  Yes Little Ishikawa, MD  atorvastatin (LIPITOR) 40 MG tablet Take 40 mg by mouth daily. 06/13/20  Yes [provider]  clobetasol cream (TEMOVATE) 0.92 % Apply 1 application topically every other day. 09/05/20  Yes [provider]  clopidogrel (PLAVIX) 75 MG tablet Take 75 mg by mouth daily. 06/13/20  Yes [provider]  feeding supplement, GLUCERNA SHAKE, (GLUCERNA SHAKE) LIQD Take 237 mLs by mouth 2 (two) times daily between meals. 09/30/20  Yes Little Ishikawa, MD  insulin glargine (LANTUS) 100 UNIT/ML injection Inject 0.15 mLs (15 Units total) into the skin at bedtime. 09/30/20  Yes Little Ishikawa, MD  insulin lispro (HUMALOG) 100 UNIT/ML KwikPen Inject 2-10 Units into the skin See admin instructions. Per sliding scale: 200-250= 2u, 251-300= 4u, 301-350= 6u, 351-400= 8u, 401-450= 10u, if greater than 450 notify MD 09/11/20  Yes [provider]  lisinopril (ZESTRIL) 20 MG tablet Take 20 mg by mouth daily. 05/30/20  Yes [provider]     Allergies:    Not on File   Physical Exam:   Vitals  Blood pressure (!) 126/97, pulse 95, temperature 99.3 F (37.4 C), temperature source Axillary,  resp. rate 14, height _0  (1.651 m), weight 59.8 kg, SpO2 100 %.  Physical Examination: General appearance -chronically ill-appearing and in no distress  Mental status -lethargic, confused, disoriented  eyes -chronic right eye findings Neck - supple, no JVD elevation , Chest - clear  to auscultation bilaterally, symmetrical air movement,  Heart - S1 and S2 normal, regular  Abdomen -  soft, nontender, nondistended, no masses or organomegaly Neurological -no new focal deficits, no tremors, neuro exam limited as patient is not cooperative lethargic and from time to time disoriented and agitated  extremities -bilateral lower extremity pedal edema noted, intact peripheral pulses  Skin - warm, dry    Data Review:    CBC Recent Labs  Lab 09/27/20 0620 09/28/20 0833 10/03/20 0816  WBC 6.9 6.0 31.4*  HGB 11.8* 11.4* 12.9  HCT 37.4 36.2 44.2  PLT 190 186 264  MCV 78.7* 80.1 86.5  MCH 24.8* 25.2* 25.2*  MCHC 31.6 31.5 29.2*  RDW 14.4 14.6 15.1  LYMPHSABS  --   --  1.5  MONOABS  --   --  1.2*  EOSABS  --   --  0.0  BASOSABS  --   --  0.1   ------------------------------------------------------------------------------------------------------------------  Chemistries  Recent Labs  Lab 09/27/20 0620 09/28/20 0833 09/28/20 2121 10/03/20 0816 10/03/20 1104 10/03/20 1526  NA 140 137  --  137 138 140  K 3.8 3.8  --  4.9 4.0 4.7  CL 107 105  --  100 105 109  CO2 22 25  --  9* 12* 18*  GLUCOSE 130* 276* 519* 591* 379* 180*  BUN 10 12  --  _0 CREATININE 0.58 0.71  --  1.84* 1.51* 1.00  CALCIUM 9.2 9.1  --  10.2 9.8 9.3  AST 24 18  --  31  --   --   ALT 21 16  --  23  --   --   ALKPHOS 63 58  --  84  --   --   BILITOT 0.8 0.8  --  1.5*  --   --    ------------------------------------------------------------------------------------------------------------------ estimated creatinine clearance is 52.5 mL/min (by C-G formula based on SCr of 1  mg/dL). ------------------------------------------------------------------------------------------------------------------ No results for input(s): TSH, T4TOTAL, T3FREE, THYROIDAB in the last 72 hours.  Invalid input(s): FREET3   Coagulation profile No results for input(s): INR, PROTIME in the last 168 hours. ------------------------------------------------------------------------------------------------------------------- No results for input(s): DDIMER in the last 72 hours. -------------------------------------------------------------------------------------------------------------------  Cardiac Enzymes No results for input(s): CKMB, TROPONINI, MYOGLOBIN in the last 168 hours.  Invalid input(s): CK ------------------------------------------------------------------------------------------------------------------ No results found for: BNP  Urinalysis    Component Value Date/Time   COLORURINE YELLOW 10/03/2020 1030   APPEARANCEUR CLEAR 10/03/2020 1030   LABSPEC 1.018 10/03/2020 1030   PHURINE 5.0 10/03/2020 1030   GLUCOSEU >=500 (A) 10/03/2020 1030   HGBUR NEGATIVE 10/03/2020 1030   BILIRUBINUR NEGATIVE 10/03/2020 1030   KETONESUR 80 (A) 10/03/2020 1030   PROTEINUR NEGATIVE 10/03/2020 1030   NITRITE NEGATIVE 10/03/2020 1030   LEUKOCYTESUR NEGATIVE 10/03/2020 1030     Imaging Results:    DG Chest Port 1 View  Result Date: 10/03/2020 CLINICAL DATA:  Shortness of breath. EXAM: PORTABLE CHEST 1 VIEW COMPARISON:  September 17, 2020. FINDINGS: The heart size and mediastinal contours are within normal limits. Both lungs are clear. No pneumothorax or pleural effusion is noted. The visualized skeletal structures are unremarkable. IMPRESSION: No active disease. Electronically Signed   By: Marijo Conception M.D.   On: 10/03/2020 09:36    Radiological Exams on Admission: DG Chest Port 1 View  Result Date: 10/03/2020 CLINICAL DATA:  Shortness of breath. EXAM: PORTABLE CHEST 1 VIEW  COMPARISON:  September 17, 2020. FINDINGS: The heart size and mediastinal contours are within normal limits. Both lungs are clear. No pneumothorax or pleural effusion is noted. The  visualized skeletal structures are unremarkable. IMPRESSION: No active disease. Electronically Signed   By: Marijo Conception M.D.   On: 10/03/2020 09:36   DVT Prophylaxis -SCD /heparin AM Labs Ordered, also please review Full Orders  Family Communication: Admission, patients condition and plan of care including tests being ordered have been discussed with the patient and sister Ms Donna Harrell who indicate understanding and agree with the plan   Code Status - Full Code  Likely DC to  Back SNF   Condition   stable  Roxan Hockey M.D on 10/03/2020 at 6:15 PM Go to [email or web address] -  for contact info  [facility] - Office  [phone]

## 2020-10-03 NOTE — ED Provider Notes (Signed)
[facility] EMERGENCY DEPARTMENT Provider Note   CSN: [id number] Arrival date & time: 10/03/20  0744     History Chief Complaint  Patient presents with  . Emesis    Donna Harrell is a 65 y.o. female.  Patient with a history of diabetes.  She recently got out of the hospital with DKA.  Patient is a nursing home.  She is a DNR.  Has become lethargic and having some vomiting.  Her sugar was read as extremely high.  The history is provided by the nursing home and medical records. No language interpreter was used.  Emesis Severity:  Mild Timing:  Intermittent Quality:  Undigested food Able to tolerate:  Liquids Progression:  Improving Chronicity:  New Recent urination:  Decreased Relieved by:  Nothing Worsened by:  Nothing Ineffective treatments:  None tried      Past Medical History:  Diagnosis Date  . Diabetes (HCC)   . 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 Hypertension   . Legally blind    per sister  . TIA (transient ischemic attack)    per sister    Patient Active Problem List   Diagnosis Date Noted  . Altered mental status 09/24/2020  . Essential hypertension 09/18/2020  . Acute metabolic encephalopathy 09/18/2020  . Elevated troponin 09/18/2020  . Elevated CK 09/18/2020  . Elevated lactic acid level 09/18/2020  . Encephalopathy 09/18/2020  . DKA (diabetic ketoacidosis) (HCC) 09/17/2020    Past Surgical History:  Procedure Laterality Date  . 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     Family History  Problem Relation Age of Onset  . High blood pressure Mother   . High blood pressure Sister   . Stroke Sister     Social History   Tobacco Use  . Smoking status: Former Games developer  . Smokeless tobacco: Never Used  . Tobacco comment: quit 20 years ago  Substance Use Topics  . Alcohol use: Not  Currently  . Drug use: Not Currently    Comment: used to use marijuana as a teenager    Home Medications Prior to Admission medications   Medication Sig Start Date End Date Taking? Authorizing Provider  amLODipine (NORVASC) 5 MG tablet Take 1 tablet (5 mg total) by mouth daily. 09/30/20  Yes Azucena Fallen, MD  atorvastatin (LIPITOR) 40 MG tablet Take 40 mg by mouth daily. 06/13/20  Yes [provider]  clobetasol cream (TEMOVATE) 0.05 % Apply 1 application topically every other day. 09/05/20  Yes [provider]  clopidogrel (PLAVIX) 75 MG tablet Take 75 mg by mouth daily. 06/13/20  Yes [provider]  feeding supplement, GLUCERNA SHAKE, (GLUCERNA SHAKE) LIQD Take 237 mLs by mouth 2 (two) times daily between meals. 09/30/20  Yes Azucena Fallen, MD  insulin glargine (LANTUS) 100 UNIT/ML injection Inject 0.15 mLs (15 Units total) into the skin at bedtime. 09/30/20  Yes Azucena Fallen, MD  insulin lispro (HUMALOG) 100 UNIT/ML KwikPen Inject 2-10 Units into the skin See admin instructions. Per sliding scale: 200-250= 2u, 251-300= 4u, 301-350= 6u, 351-400= 8u, 401-450= 10u, if greater than 450 notify MD 09/11/20  Yes [provider]  lisinopril (ZESTRIL) 20 MG tablet Take 20 mg by mouth daily. 05/30/20  Yes [provider]    Allergies  Patient has no allergy information on record.  Review of Systems   Review of Systems  Unable to perform ROS: Mental status change  Gastrointestinal: Positive for vomiting.    Physical Exam Updated Vital Signs BP (!) 101/57   Pulse (!) 108   Temp 97.7 F (36.5 C) (Oral)   Resp 16   Ht 5\' 5"  (1.651 m)   Wt 65 kg   SpO2 100%   BMI 23.85 kg/m   Physical Exam Vitals and nursing note reviewed.  Constitutional:      Appearance: She is well-developed.     Comments: Lethargic  HENT:     Head: Normocephalic.     Comments: Dry mucous membrane    Mouth/Throat:     Comments: Dry mucous  membrane Eyes:     General: No scleral icterus.    Conjunctiva/sclera: Conjunctivae normal.  Neck:     Thyroid: No thyromegaly.  Cardiovascular:     Rate and Rhythm: Regular rhythm. Tachycardia present.     Heart sounds: No murmur heard. No friction rub. No gallop.   Pulmonary:     Breath sounds: No stridor. No wheezing or rales.  Chest:     Chest wall: No tenderness.  Abdominal:     General: There is no distension.     Tenderness: There is no abdominal tenderness. There is no rebound.  Musculoskeletal:        General: Normal range of motion.     Cervical back: Neck supple.     Comments: Decreased pulses in lower extremities  Lymphadenopathy:     Cervical: No cervical adenopathy.  Skin:    Findings: No erythema or rash.  Neurological:     Motor: No abnormal muscle tone.     Coordination: Coordination normal.  Psychiatric:     Comments: Patient very lethargic     ED Results / Procedures / Treatments   Labs (all labs ordered are listed, but only abnormal results are displayed) Labs Reviewed  BASIC METABOLIC PANEL - Abnormal; Notable for the following components:      Result Value   CO2 9 (*)    Glucose, Bld 591 (*)    Creatinine, Ser 1.84 (*)    GFR, Estimated 31 (*)    Anion gap 28 (*)    All other components within normal limits  BETA-HYDROXYBUTYRIC ACID - Abnormal; Notable for the following components:   Beta-Hydroxybutyric Acid >8.00 (*)    All other components within normal limits  CBC WITH DIFFERENTIAL/PLATELET - Abnormal; Notable for the following components:   WBC 31.4 (*)    MCH 25.2 (*)    MCHC 29.2 (*)    Neutro Abs 28.2 (*)    Monocytes Absolute 1.2 (*)    Abs Immature Granulocytes 0.51 (*)    All other components within normal limits  URINALYSIS, ROUTINE W REFLEX MICROSCOPIC - Abnormal; Notable for the following components:   Glucose, UA >=500 (*)    Ketones, ur 80 (*)    All other components within normal limits  BLOOD GAS, VENOUS - Abnormal;  Notable for the following components:   pH, Ven 7.131 (*)    pCO2, Ven 24.7 (*)    Bicarbonate 9.5 (*)    Acid-base deficit 19.5 (*)    All other components within normal limits  HEPATIC FUNCTION PANEL - Abnormal; Notable for the following components:   Total Bilirubin 1.5 (*)    Indirect Bilirubin 1.4 (*)    All other components within normal limits  CBG MONITORING, ED - Abnormal; Notable for the following components:   Glucose-Capillary >600 (*)    All other components within normal limits  CBG MONITORING, ED - Abnormal; Notable for the following components:   Glucose-Capillary 431 (*)    All other components within normal limits  CBG MONITORING, ED - Abnormal; Notable for the following components:   Glucose-Capillary 338 (*)    All other components within normal limits  CULTURE, BLOOD (ROUTINE X 2)  CULTURE, BLOOD (ROUTINE X 2)  RESP PANEL BY RT-PCR (FLU A&B, COVID) ARPGX2  BASIC METABOLIC PANEL  BASIC METABOLIC PANEL  BASIC METABOLIC PANEL  BETA-HYDROXYBUTYRIC ACID  LACTIC ACID, PLASMA    EKG None  Radiology DG Chest Port 1 View  Result Date: 10/03/2020 CLINICAL DATA:  Shortness of breath. EXAM: PORTABLE CHEST 1 VIEW COMPARISON:  September 17, 2020. FINDINGS: The heart size and mediastinal contours are within normal limits. Both lungs are clear. No pneumothorax or pleural effusion is noted. The visualized skeletal structures are unremarkable. IMPRESSION: No active disease. Electronically Signed   By: Lupita Raider M.D.   On: 10/03/2020 09:36    Procedures Procedures   Medications Ordered in ED Medications  insulin regular, human (MYXREDLIN) 100 units/ 100 mL infusion (8.5 Units/hr Intravenous New Bag/Given 10/03/20 1019)  lactated ringers infusion (has no administration in time range)  dextrose 5 % in lactated ringers infusion (has no administration in time range)  dextrose 50 % solution 0-50 mL (has no administration in time range)  ondansetron (ZOFRAN-ODT)  disintegrating tablet 4 mg (has no administration in time range)  potassium chloride 10 mEq in 100 mL IVPB (has no administration in time range)  potassium chloride 10 mEq in 100 mL IVPB (has no administration in time range)  sodium chloride 0.9 % bolus 1,000 mL (1,000 mLs Intravenous New Bag/Given 10/03/20 1012)  lactated ringers bolus 1,300 mL (1,300 mLs Intravenous New Bag/Given 10/03/20 1012)  insulin aspart (novoLOG) injection 20 Units (20 Units Subcutaneous Given 10/03/20 9233)    ED Course  I have reviewed the triage vital signs and the nursing notes.  Pertinent labs & imaging results that were available during my care of the patient were reviewed by me and considered in my medical decision making (see chart for details). CRITICAL CARE Performed by: Bethann Berkshire Total critical care time: 45 minutes Critical care time was exclusive of separately billable procedures and treating other patients. Critical care was necessary to treat or prevent imminent or life-threatening deterioration. Critical care was time spent personally by me on the following activities: development of treatment plan with patient and/or surrogate as well as nursing, discussions with consultants, evaluation of patient's response to treatment, examination of patient, obtaining history from patient or surrogate, ordering and performing treatments and interventions, ordering and review of laboratory studies, ordering and review of radiographic studies, pulse oximetry and re-evaluation of patient's condition.    MDM Rules/Calculators/A&P                          Patient with severe DKA.  She is started on insulin drip and will be admitted to medicine Final Clinical Impression(s) / ED Diagnoses Final diagnoses:  None    Rx / DC Orders ED Discharge Orders    None       Bethann Berkshire, MD 10/03/20 1106

## 2020-10-03 NOTE — ED Notes (Signed)
Pt is a hard stick 

## 2020-10-03 NOTE — ED Provider Notes (Signed)
[facility] EMERGENCY DEPARTMENT Provider Note   CSN: [id number] Arrival date & time: 10/03/20  0744     History Chief Complaint  Patient presents with  . Emesis    Donna Harrell is a 65 y.o. female.   Emesis      Past Medical History:  Diagnosis Date  . Diabetes (HCC)   . 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 Hypertension   . Legally blind    per sister  . TIA (transient ischemic attack)    per sister    Patient Active Problem List   Diagnosis Date Noted  . Altered mental status 09/24/2020  . Essential hypertension 09/18/2020  . Acute metabolic encephalopathy 09/18/2020  . Elevated troponin 09/18/2020  . Elevated CK 09/18/2020  . Elevated lactic acid level 09/18/2020  . Encephalopathy 09/18/2020  . DKA (diabetic ketoacidosis) (HCC) 09/17/2020    Past Surgical History:  Procedure Laterality Date  . 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     Family History  Problem Relation Age of Onset  . High blood pressure Mother   . High blood pressure Sister   . Stroke Sister     Social History   Tobacco Use  . Smoking status: Former Games developer  . Smokeless tobacco: Never Used  . Tobacco comment: quit 20 years ago  Substance Use Topics  . Alcohol use: Not Currently  . Drug use: Not Currently    Comment: used to use marijuana as a teenager    Home Medications Prior to Admission medications   Medication Sig Start Date End Date Taking? Authorizing Provider  amLODipine (NORVASC) 5 MG tablet Take 1 tablet (5 mg total) by mouth daily. 09/30/20  Yes Azucena Fallen, MD  atorvastatin (LIPITOR) 40 MG tablet Take 40 mg by mouth daily. 06/13/20  Yes [provider]  clobetasol cream (TEMOVATE) 0.05 % Apply 1 application topically every other day. 09/05/20  Yes [provider]  clopidogrel  (PLAVIX) 75 MG tablet Take 75 mg by mouth daily. 06/13/20  Yes [provider]  feeding supplement, GLUCERNA SHAKE, (GLUCERNA SHAKE) LIQD Take 237 mLs by mouth 2 (two) times daily between meals. 09/30/20  Yes Azucena Fallen, MD  insulin glargine (LANTUS) 100 UNIT/ML injection Inject 0.15 mLs (15 Units total) into the skin at bedtime. 09/30/20  Yes Azucena Fallen, MD  insulin lispro (HUMALOG) 100 UNIT/ML KwikPen Inject 2-10 Units into the skin See admin instructions. Per sliding scale: 200-250= 2u, 251-300= 4u, 301-350= 6u, 351-400= 8u, 401-450= 10u, if greater than 450 notify MD 09/11/20  Yes [provider]  lisinopril (ZESTRIL) 20 MG tablet Take 20 mg by mouth daily. 05/30/20  Yes [provider]    Allergies    Patient has no allergy information on record.  Review of Systems   Review of Systems  Gastrointestinal: Positive for vomiting.    Physical Exam Updated Vital Signs BP (!) 101/57   Pulse (!) 108   Temp 97.7 F (36.5 C) (Oral)   Resp 16   Ht 5\' 5"  (1.651 m)   Wt 65 kg   SpO2 100%   BMI 23.85 kg/m   Physical Exam  ED Results / Procedures / Treatments   Labs (all labs ordered are listed, but only abnormal results are displayed)  Labs Reviewed  BASIC METABOLIC PANEL - Abnormal; Notable for the following components:      Result Value   CO2 9 (*)    Glucose, Bld 591 (*)    Creatinine, Ser 1.84 (*)    GFR, Estimated 31 (*)    Anion gap 28 (*)    All other components within normal limits  BETA-HYDROXYBUTYRIC ACID - Abnormal; Notable for the following components:   Beta-Hydroxybutyric Acid >8.00 (*)    All other components within normal limits  CBC WITH DIFFERENTIAL/PLATELET - Abnormal; Notable for the following components:   WBC 31.4 (*)    MCH 25.2 (*)    MCHC 29.2 (*)    Neutro Abs 28.2 (*)    Monocytes Absolute 1.2 (*)    Abs Immature Granulocytes 0.51 (*)    All other components within normal limits  URINALYSIS, ROUTINE W  REFLEX MICROSCOPIC - Abnormal; Notable for the following components:   Glucose, UA >=500 (*)    Ketones, ur 80 (*)    All other components within normal limits  BLOOD GAS, VENOUS - Abnormal; Notable for the following components:   pH, Ven 7.131 (*)    pCO2, Ven 24.7 (*)    Bicarbonate 9.5 (*)    Acid-base deficit 19.5 (*)    All other components within normal limits  HEPATIC FUNCTION PANEL - Abnormal; Notable for the following components:   Total Bilirubin 1.5 (*)    Indirect Bilirubin 1.4 (*)    All other components within normal limits  CBG MONITORING, ED - Abnormal; Notable for the following components:   Glucose-Capillary >600 (*)    All other components within normal limits  CBG MONITORING, ED - Abnormal; Notable for the following components:   Glucose-Capillary 431 (*)    All other components within normal limits  CBG MONITORING, ED - Abnormal; Notable for the following components:   Glucose-Capillary 338 (*)    All other components within normal limits  CULTURE, BLOOD (ROUTINE X 2)  CULTURE, BLOOD (ROUTINE X 2)  RESP PANEL BY RT-PCR (FLU A&B, COVID) ARPGX2  BASIC METABOLIC PANEL  BASIC METABOLIC PANEL  BASIC METABOLIC PANEL  BETA-HYDROXYBUTYRIC ACID  LACTIC ACID, PLASMA    EKG None  Radiology DG Chest Port 1 View  Result Date: 10/03/2020 CLINICAL DATA:  Shortness of breath. EXAM: PORTABLE CHEST 1 VIEW COMPARISON:  September 17, 2020. FINDINGS: The heart size and mediastinal contours are within normal limits. Both lungs are clear. No pneumothorax or pleural effusion is noted. The visualized skeletal structures are unremarkable. IMPRESSION: No active disease. Electronically Signed   By: Lupita Raider M.D.   On: 10/03/2020 09:36    Procedures Procedures   Medications Ordered in ED Medications  insulin regular, human (MYXREDLIN) 100 units/ 100 mL infusion (8.5 Units/hr Intravenous New Bag/Given 10/03/20 1019)  lactated ringers infusion (has no administration in time  range)  dextrose 5 % in lactated ringers infusion (has no administration in time range)  dextrose 50 % solution 0-50 mL (has no administration in time range)  ondansetron (ZOFRAN-ODT) disintegrating tablet 4 mg (has no administration in time range)  potassium chloride 10 mEq in 100 mL IVPB (has no administration in time range)  potassium chloride 10 mEq in 100 mL IVPB (has no administration in time range)  sodium chloride 0.9 % bolus 1,000 mL (1,000 mLs Intravenous New Bag/Given 10/03/20 1012)  lactated ringers bolus 1,300 mL (1,300 mLs Intravenous New Bag/Given 10/03/20 1012)  insulin aspart (novoLOG) injection 20 Units (  20 Units Subcutaneous Given 10/03/20 3557)    ED Course  I have reviewed the triage vital signs and the nursing notes.  Pertinent labs & imaging results that were available during my care of the patient were reviewed by me and considered in my medical decision making (see chart for details).    MDM Rules/Calculators/A&P                           Final Clinical Impression(s) / ED Diagnoses Final diagnoses:  None    Rx / DC Orders ED Discharge Orders    None       Bethann Berkshire, MD 10/06/20 1128

## 2020-10-03 NOTE — ED Notes (Signed)
SWOT nurse aware per charge nurse for IV access. Dr. Eather Colas of several nurses attempting IV access with no success.

## 2020-10-03 NOTE — ED Notes (Signed)
Faint pedal pulse to left foot, area marked

## 2020-10-03 NOTE — ED Notes (Signed)
Date and time results received: 10/03/20 & 0840hrs  Test: [facility]  Critical Value: 7.131   Name of Provider Notified: Dr. Estell Harpin  Orders Received? Or Actions Taken?: Notified

## 2020-10-03 NOTE — ED Triage Notes (Signed)
Pt brought to ED via [facility] from [facility], found in her room AMS, covered in EMS, CBG read high, HR 130's. Hx DM. Left foot also cold and EMS unable to find pulse, has wound on left great toe, won't keep bandage on.

## 2020-10-04 DIAGNOSIS — Z66 Do not resuscitate: Secondary | ICD-10-CM | POA: Diagnosis present

## 2020-10-04 DIAGNOSIS — Z794 Long term (current) use of insulin: Secondary | ICD-10-CM | POA: Diagnosis not present

## 2020-10-04 DIAGNOSIS — E1010 Type 1 diabetes mellitus with ketoacidosis without coma: Secondary | ICD-10-CM | POA: Diagnosis present

## 2020-10-04 DIAGNOSIS — G9341 Metabolic encephalopathy: Secondary | ICD-10-CM | POA: Diagnosis present

## 2020-10-04 DIAGNOSIS — F32A Depression, unspecified: Secondary | ICD-10-CM

## 2020-10-04 DIAGNOSIS — Z6821 Body mass index (BMI) 21.0-21.9, adult: Secondary | ICD-10-CM | POA: Diagnosis not present

## 2020-10-04 DIAGNOSIS — Z79899 Other long term (current) drug therapy: Secondary | ICD-10-CM | POA: Diagnosis not present

## 2020-10-04 DIAGNOSIS — Z87891 Personal history of nicotine dependence: Secondary | ICD-10-CM | POA: Diagnosis not present

## 2020-10-04 DIAGNOSIS — E1069 Type 1 diabetes mellitus with other specified complication: Secondary | ICD-10-CM | POA: Diagnosis not present

## 2020-10-04 DIAGNOSIS — Z20822 Contact with and (suspected) exposure to covid-19: Secondary | ICD-10-CM | POA: Diagnosis present

## 2020-10-04 DIAGNOSIS — K118 Other diseases of salivary glands: Secondary | ICD-10-CM | POA: Diagnosis present

## 2020-10-04 DIAGNOSIS — E43 Unspecified severe protein-calorie malnutrition: Secondary | ICD-10-CM | POA: Diagnosis present

## 2020-10-04 DIAGNOSIS — Z823 Family history of stroke: Secondary | ICD-10-CM | POA: Diagnosis not present

## 2020-10-04 DIAGNOSIS — E860 Dehydration: Secondary | ICD-10-CM | POA: Diagnosis present

## 2020-10-04 DIAGNOSIS — Z8673 Personal history of transient ischemic attack (TIA), and cerebral infarction without residual deficits: Secondary | ICD-10-CM | POA: Diagnosis not present

## 2020-10-04 DIAGNOSIS — R627 Adult failure to thrive: Secondary | ICD-10-CM | POA: Diagnosis present

## 2020-10-04 DIAGNOSIS — N179 Acute kidney failure, unspecified: Secondary | ICD-10-CM | POA: Diagnosis present

## 2020-10-04 DIAGNOSIS — I10 Essential (primary) hypertension: Secondary | ICD-10-CM | POA: Diagnosis present

## 2020-10-04 DIAGNOSIS — Z7902 Long term (current) use of antithrombotics/antiplatelets: Secondary | ICD-10-CM | POA: Diagnosis not present

## 2020-10-04 LAB — CBC WITH DIFFERENTIAL/PLATELET
Abs Immature Granulocytes: 0.07 10*3/uL (ref 0.00–0.07)
Basophils Absolute: 0.1 10*3/uL (ref 0.0–0.1)
Basophils Relative: 0 %
Eosinophils Absolute: 0 10*3/uL (ref 0.0–0.5)
Eosinophils Relative: 0 %
HCT: 38 % (ref 36.0–46.0)
Hemoglobin: 11.9 g/dL — ABNORMAL LOW (ref 12.0–15.0)
Immature Granulocytes: 0 %
Lymphocytes Relative: 13 %
Lymphs Abs: 2.4 10*3/uL (ref 0.7–4.0)
MCH: 25.5 pg — ABNORMAL LOW (ref 26.0–34.0)
MCHC: 31.3 g/dL (ref 30.0–36.0)
MCV: 81.4 fL (ref 80.0–100.0)
Monocytes Absolute: 0.9 10*3/uL (ref 0.1–1.0)
Monocytes Relative: 5 %
Neutro Abs: 14.8 10*3/uL — ABNORMAL HIGH (ref 1.7–7.7)
Neutrophils Relative %: 82 %
Platelets: 231 10*3/uL (ref 150–400)
RBC: 4.67 MIL/uL (ref 3.87–5.11)
RDW: 15.2 % (ref 11.5–15.5)
WBC: 18.3 10*3/uL — ABNORMAL HIGH (ref 4.0–10.5)
nRBC: 0 % (ref 0.0–0.2)

## 2020-10-04 LAB — GLUCOSE, CAPILLARY
Glucose-Capillary: 116 mg/dL — ABNORMAL HIGH (ref 70–99)
Glucose-Capillary: 134 mg/dL — ABNORMAL HIGH (ref 70–99)
Glucose-Capillary: 137 mg/dL — ABNORMAL HIGH (ref 70–99)
Glucose-Capillary: 148 mg/dL — ABNORMAL HIGH (ref 70–99)
Glucose-Capillary: 151 mg/dL — ABNORMAL HIGH (ref 70–99)
Glucose-Capillary: 162 mg/dL — ABNORMAL HIGH (ref 70–99)
Glucose-Capillary: 162 mg/dL — ABNORMAL HIGH (ref 70–99)
Glucose-Capillary: 164 mg/dL — ABNORMAL HIGH (ref 70–99)
Glucose-Capillary: 171 mg/dL — ABNORMAL HIGH (ref 70–99)
Glucose-Capillary: 176 mg/dL — ABNORMAL HIGH (ref 70–99)
Glucose-Capillary: 185 mg/dL — ABNORMAL HIGH (ref 70–99)

## 2020-10-04 LAB — BASIC METABOLIC PANEL
Anion gap: 8 (ref 5–15)
BUN: 13 mg/dL (ref 8–23)
CO2: 22 mmol/L (ref 22–32)
Calcium: 9.2 mg/dL (ref 8.9–10.3)
Chloride: 111 mmol/L (ref 98–111)
Creatinine, Ser: 0.75 mg/dL (ref 0.44–1.00)
GFR, Estimated: >60 mL/min (ref >60)
Glucose, Bld: 142 mg/dL — ABNORMAL HIGH (ref 70–99)
Potassium: 3.8 mmol/L (ref 3.5–5.1)
Sodium: 141 mmol/L (ref 135–145)

## 2020-10-04 LAB — LACTIC ACID, PLASMA
Lactic Acid, Venous: 1.8 mmol/L (ref 0.5–1.9)
Lactic Acid, Venous: 1.8 mmol/L (ref 0.5–1.9)

## 2020-10-04 LAB — BETA-HYDROXYBUTYRIC ACID: Beta-Hydroxybutyric Acid: 0.41 mmol/L — ABNORMAL HIGH (ref 0.05–0.27)

## 2020-10-04 MED ORDER — HYDRALAZINE HCL 20 MG/ML IJ SOLN
10.0000 mg | Freq: Four times a day (QID) | INTRAMUSCULAR | Status: DC | PRN
  Administered 2020-10-04: 10 mg via INTRAVENOUS
  Filled 2020-10-04: qty 1

## 2020-10-04 MED ORDER — AMLODIPINE BESYLATE 5 MG PO TABS
5.0000 mg | ORAL_TABLET | Freq: Every day | ORAL | Status: DC
  Administered 2020-10-04 – 2020-10-05 (×2): 5 mg via ORAL
  Filled 2020-10-04 (×2): qty 1

## 2020-10-04 MED ORDER — INSULIN ASPART 100 UNIT/ML ~~LOC~~ SOLN: 0.0000 [IU] | Freq: Every day | SUBCUTANEOUS | Status: DC

## 2020-10-04 MED ORDER — ATORVASTATIN CALCIUM 40 MG PO TABS
40.0000 mg | ORAL_TABLET | Freq: Every day | ORAL | Status: DC
  Administered 2020-10-04 – 2020-10-05 (×2): 40 mg via ORAL
  Filled 2020-10-04 (×2): qty 1

## 2020-10-04 MED ORDER — INSULIN ASPART 100 UNIT/ML ~~LOC~~ SOLN
0.0000 [IU] | Freq: Three times a day (TID) | SUBCUTANEOUS | Status: DC
  Administered 2020-10-04: 3 [IU] via SUBCUTANEOUS
  Administered 2020-10-05: 2 [IU] via SUBCUTANEOUS

## 2020-10-04 MED ORDER — INSULIN GLARGINE 100 UNIT/ML ~~LOC~~ SOLN
10.0000 [IU] | Freq: Once | SUBCUTANEOUS | Status: AC
  Administered 2020-10-04: 10 [IU] via SUBCUTANEOUS
  Filled 2020-10-04: qty 0.1

## 2020-10-04 MED ORDER — CLOPIDOGREL BISULFATE 75 MG PO TABS
75.0000 mg | ORAL_TABLET | Freq: Every day | ORAL | Status: DC
  Administered 2020-10-04 – 2020-10-05 (×2): 75 mg via ORAL
  Filled 2020-10-04 (×2): qty 1

## 2020-10-04 MED ORDER — INSULIN GLARGINE 100 UNIT/ML ~~LOC~~ SOLN
15.0000 [IU] | Freq: Every day | SUBCUTANEOUS | Status: DC
  Administered 2020-10-04: 15 [IU] via SUBCUTANEOUS
  Filled 2020-10-04 (×2): qty 0.15

## 2020-10-04 NOTE — Progress Notes (Signed)
Lantus given at 0932. Insulin gtt and fluids to be stopped in 2 hours at 1132. Will continue to monitor.

## 2020-10-04 NOTE — Evaluation (Signed)
Physical Therapy Evaluation Patient Details Name: Donna Harrell MRN: [id number] DOB: 1955-12-16 Today's Date: 10/04/2020   History of Present Illness  Donna Harrell is a 65 y.o. female.     Patient with a history of diabetes.  She recently got out of the hospital with DKA.  Patient is a nursing home.  She is a DNR.  Has become lethargic and having some vomiting.  Her sugar was read as extremely high.  Clinical Impression  Present and agreeable to PT, impulsive and ready to get out of bed without PT adjusting lines and leads. Able to complete bed mobility well with HOB slightly elevated. Complete sit to/from stand with use of RW with [employer] for safety. Ambulate around ICU unit, but very impulsive and difficulty following directions. Intermittent preference lifting RW vs using walker for assistance, and increased drifting throughout, consistent with legally blind. Slight difficulty transferring back to bed with decreased ability to clear RLE into bed. Discussed progress and ambulation status with nurse and notified of position in bed. Continues to benefit from acute PT to allow for improved safety and strength to transition to next venue of care.     Follow Up Recommendations SNF    Equipment Recommendations  Rolling walker with 5" wheels    Recommendations for Other Services       Precautions / Restrictions Precautions Precautions: Fall Precaution Comments: can be aggressive, hitting, legally blind Restrictions Weight Bearing Restrictions: No      Mobility  Bed Mobility Overal bed mobility: Modified Independent Bed Mobility: Supine to Sit;Sit to Supine     Supine to sit: Supervision Sit to supine: Min assist;Min guard   General bed mobility comments: safety, increased difficulty lifting RLE onto bed Patient Response: Impulsive  Transfers Overall transfer level: Modified independent Equipment used: Rolling walker (2 wheeled) Transfers: Sit to/from Stand Sit to Stand:  Supervision         General transfer comment: increased speed, use of RW for pushing up, no reaching back or pushing from bed  Ambulation/Gait Ambulation/Gait assistance: Min assist Gait Distance (Feet): 200 Feet Assistive device: Rolling walker (2 wheeled) Gait Pattern/deviations: Step-through pattern;Drifts right/left Gait velocity: typical with RW, decreased ability to turn safely.   General Gait Details: intermittent preference ot life RW from ground  Stairs            Wheelchair Mobility    Modified Rankin (Stroke Patients Only)       Balance Overall balance assessment: Needs assistance Sitting-balance support: Bilateral upper extremity supported;Feet unsupported Sitting balance-Leahy Scale: Fair Sitting balance - Comments: leans forward , trunk not stable, impulsive   Standing balance support: Bilateral upper extremity supported;During functional activity Standing balance-Leahy Scale: Fair Standing balance comment: with RW                             Pertinent Vitals/Pain Pain Assessment: No/denies pain    Home Living Family/patient expects to be discharged to:: Skilled nursing facility Living Arrangements: Alone               Additional Comments: from SNF    Prior Function Level of Independence: Needs assistance   Gait / Transfers Assistance Needed: MinA-modA stearing RW  ADL's / Homemaking Assistance Needed: legally blind, requires assistance        Hand Dominance        Extremity/Trunk Assessment   Upper Extremity Assessment Upper Extremity Assessment: Overall WFL for tasks assessed  Lower Extremity Assessment Lower Extremity Assessment: Overall WFL for tasks assessed    Cervical / Trunk Assessment Cervical / Trunk Assessment: Normal  Communication   Communication: No difficulties  Cognition Arousal/Alertness: Awake/alert Behavior During Therapy: Impulsive Overall Cognitive Status: Within Functional Limits for  tasks assessed                                 General Comments: impulsive with PT directions and wants to ambulate where she wants vs where PT wants. Stated that this is not her room multiple times      General Comments      Exercises General Exercises - Lower Extremity Ankle Circles/Pumps: AROM;Strengthening;Both;10 reps;Supine   Assessment/Plan    PT Assessment Patient needs continued PT services  PT Problem List Decreased strength;Decreased mobility;Decreased safety awareness;Decreased range of motion;Decreased activity tolerance;Decreased cognition;Decreased balance;Decreased knowledge of use of DME;Decreased coordination       PT Treatment Interventions DME instruction;Therapeutic activities;Gait training;Therapeutic exercise;Patient/family education;Balance training;Functional mobility training    PT Goals (Current goals can be found in the Care Plan section)  Acute Rehab PT Goals Patient Stated Goal: return home/to SNF PT Goal Formulation: With patient Time For Goal Achievement: 10/18/20 Potential to Achieve Goals: Good    Frequency Min 3X/week   Barriers to discharge        Co-evaluation     PT goals addressed during session: Mobility/safety with mobility;Balance         AM-PAC PT "6 Clicks" Mobility  Outcome Measure Help needed turning from your back to your side while in a flat bed without using bedrails?: None Help needed moving from lying on your back to sitting on the side of a flat bed without using bedrails?: None Help needed moving to and from a bed to a chair (including a wheelchair)?: A Little Help needed standing up from a chair using your arms (e.g., wheelchair or bedside chair)?: None Help needed to walk in hospital room?: A Little Help needed climbing 3-5 steps with a railing? : A Lot 6 Click Score: 20    End of Session   Activity Tolerance: Patient tolerated treatment well Patient left: in bed;with call bell/phone within  reach;with bed alarm set Nurse Communication: Mobility status PT Visit Diagnosis: Unsteadiness on feet (R26.81);Muscle weakness (generalized) (M62.81);Other abnormalities of gait and mobility (R26.89)    Time: 0034-9179 PT Time Calculation (min) (ACUTE ONLY): 18 min   Charges:   PT Evaluation $PT Eval Low Complexity: 1 Low PT Treatments $Gait Training: 8-22 mins        10:37 AM,10/04/20 Esmeralda Links, PT, DPT Physical Therapist at [facility]

## 2020-10-04 NOTE — Progress Notes (Signed)
PROGRESS NOTE    Patient: Donna Harrell                            PCP: Pcp, No                    DOB: 1956/06/07            DOA: 10/03/2020 HCW:[id number]             DOS: 10/04/2020, 11:21 AM   LOS: 0 days   Date of Service: The patient was seen and examined on 10/04/2020  Subjective:   The patient was seen and examined this morning. Hemodynamically stable, in ICU setting.  mental status back to baseline, poor insight but able to answer questions appropriately N.p.o., on IV fluids, insulin drip Otherwise no issues overnight .  Brief Narrative:   Donna Harrell  is a 65 y.o.f w PMH/o  DM1, depression, hypertension who is originally from [place] discharge to SNF from [facility] on 09/30/2020--- brought in from SNF facility  due to concern for altered mentation/confusion and found to have elevated glucose and pathophysiology consistent with DKA.  Patient was also confused, delirious on admission.  Had recent hospitalization for similar issues, was recently discharged on 09/30/2020, for DKA encephalopathy.   Assessment & Plan:   Principal Problem:   DKA (diabetic ketoacidosis) ([place]) Active Problems:   Essential hypertension   Acute metabolic encephalopathy   Type 1 diabetes mellitus with other specified complication (HCC)   Depression     1)DKA-diabetic ketoacidosis Recurrent, recent hospitalization for DKA was discharged on 09/30/2020 Readmitted yesterday for DKA 10/03/2020-from SNF  -patient met DKA criteria on admission with a bicarb of 9, anion gap of 28, serum glucose of 591 and beta hydroxybutyric acid of >> 8.0 -Responded to aggressive IV fluid resuscitation, insulin drip, beta hydroxybutyrate acid anion gap has closed this morning -We will discontinue insulin drip, initiate long-acting insulin Lantus 10 units subcutaneous  -We will continue with IV fluid hydration -Initiate oral intake -Initiating/scale insulin, checking CBGs every 4 hours then q. ACH  S  Lactic acidosis -Due to diabetic ketoacidosis, responded to IV fluid resuscitation, lactic acid continue to improve 7.1, 4.9, 2.5 this a.m. -No signs of infection, cultures no growth to date   2)AKI----acute kidney injury due to dehydration in the setting of DKA -Improved creatinine back to baseline 0.75 this a.m. -- creatinine on admission=1.81  , baseline creatinine = 0.71 ,  - renally adjust medications, avoid nephrotoxic agents / dehydration  / hypotension  3)Leukocytosis and Lactic Acidosis- -reactive likely due to DKA, no signs of infection -Resolved  4)Acute Metabolic Encephalopathy- -possible underlying dementia exacerbated by delirium DKA - multifactorial, metabolic derangement and major contributing factor -Back to baseline-poor insight  5)Hyperdense right globe - Likely from surgery in the past.  - Patient's right eyeball was found to be hyperdense on CT head.  -As per patient's sister pt had 2 surgeries on right eye but her vision could not be restored  6)Parotid Gland Lesion, incidental finding - MRI shows 1.6 cm lesion within the superficial lobe of the left parotid gland, partially visualized. Could be artifact versus primary neoplasm. - Dedicated CT soft tissue neck with contrast suggested when patient is more able to tolerate - will likely need to be an outpatient workup at this point.  7)Failure to thrive - severe protein caloric malnutrition-- get nutritional consult  Disposition/Need for in-Hospital Stay - patient unable  to be discharged at this time due to -anion gap closed, treated for DKA, initiating long-acting insulin, initiating oral intake, uncontrolled blood sugars another 24-hour close observation, continue IV fluid hydration   Dispo: The patient is from: SNF  Anticipated d/c is to: SNF  Anticipated d/c date is: -22 days  --   Patient currently is not medically stable to d/c. Barriers: Not Clinically  Stable-initiating oral intake, checking blood sugars, initiating long-acting insulin  ------------------------------------------------------------------------------------------------------------------------------------------- Nutritional status:  The patient's BMI is: Body mass index is 21.94 kg/m.  Level of care: Stepdown   Procedures:   No admission procedures for hospital encounter.    Antimicrobials:  Anti-infectives (From admission, onward)   None       Medication:  . amLODipine  5 mg Oral Daily  . atorvastatin  40 mg Oral Daily  . Chlorhexidine Gluconate Cloth  6 each Topical Daily  . clopidogrel  75 mg Oral Daily  . insulin aspart  0-15 Units Subcutaneous TID WC  . insulin aspart  0-5 Units Subcutaneous QHS  . insulin glargine  15 Units Subcutaneous QHS  . mouth rinse  15 mL Mouth Rinse BID  . ondansetron  4 mg Oral Once    dextrose, hydrALAZINE   Objective:   Vitals:   10/04/20 0817 10/04/20 0900 10/04/20 1000 10/04/20 1100  BP:  (!) 179/73 116/87 (!) 148/70  Pulse:  92 98 98  Resp:  (!) 26 (!) 29 10  Temp: 98.3 F (36.8 C)     TempSrc: Oral     SpO2:  100% 100%   Weight:      Height:        Intake/Output Summary (Last 24 hours) at 10/04/2020 1121 Last data filed at 10/04/2020 0500 Gross per 24 hour  Intake 6964.12 ml  Output 500 ml  Net 6464.12 ml   Filed Weights   10/03/20 0747 10/03/20 1612  Weight: 65 kg 59.8 kg     Examination:   Physical Exam  Constitution:  Alert, cooperative, no distress,  Appears calm and comfortable  Psychiatric: Normal and stable mood and affect, cognition intact,   HEENT: Normocephalic, PERRL, otherwise with in Normal limits  Chest:Chest symmetric Cardio vascular:  S1/S2, RRR, No murmure, No Rubs or Gallops  pulmonary: Clear to auscultation bilaterally, respirations unlabored, negative wheezes / crackles Abdomen: Soft, non-tender, non-distended, bowel sounds,no masses, no organomegaly Muscular skeletal:  Limited exam - in bed, able to move all 4 extremities, Normal strength,  Neuro: CNII-XII intact. , normal motor and sensation, reflexes intact  Extremities: No pitting edema lower extremities, +2 pulses  Skin: Dry, warm to touch, negative for any Rashes, No open wounds Wounds: per nursing documentation    ------------------------------------------------------------------------------------------------------------------------------------------    LABs:  CBC Latest Ref Rng & Units 10/04/2020 10/03/2020 09/28/2020  WBC 4.0 - 10.5 K/uL 18.3(H) 31.4(H) 6.0  Hemoglobin 12.0 - 15.0 g/dL 11.9(L) 12.9 11.4(L)  Hematocrit 36.0 - 46.0 % 38.0 44.2 36.2  Platelets 150 - 400 K/uL 231 264 186   CMP Latest Ref Rng & Units 10/04/2020 10/03/2020 10/03/2020  Glucose 70 - 99 mg/dL 142(H) 171(H) 180(H)  BUN 8 - 23 mg/dL _0 Creatinine 0.44 - 1.00 mg/dL 0.75 0.90 1.00  Sodium 135 - 145 mmol/L 141 138 140  Potassium 3.5 - 5.1 mmol/L 3.8 4.4 4.7  Chloride 98 - 111 mmol/L 111 109 109  CO2 22 - 32 mmol/L 22 20(L) 18(L)  Calcium 8.9 - 10.3 mg/dL 9.2 9.0 9.3  Total  Protein 6.5 - 8.1 g/dL - - -  Total Bilirubin 0.3 - 1.2 mg/dL - - -  Alkaline Phos 38 - 126 U/L - - -  AST 15 - 41 U/L - - -  ALT 0 - 44 U/L - - -       Micro Results Recent Results (from the past 240 hour(s))  SARS CORONAVIRUS 2 (TAT 6-24 HRS) Nasopharyngeal Nasopharyngeal Swab     Status: None   Collection Time: 09/29/20 11:15 AM   Specimen: Nasopharyngeal Swab  Result Value Ref Range Status   SARS Coronavirus 2 NEGATIVE NEGATIVE Final    Comment: (NOTE) SARS-CoV-2 target nucleic acids are NOT DETECTED.  The SARS-CoV-2 RNA is generally detectable in upper and lower respiratory specimens during the acute phase of infection. Negative results do not preclude SARS-CoV-2 infection, do not rule out co-infections with other pathogens, and should not be used as the sole basis for treatment or other patient management decisions. Negative  results must be combined with clinical observations, patient history, and epidemiological information. The expected result is Negative.  Fact Sheet for Patients: SugarRoll.be  Fact Sheet for Healthcare Providers: https://www.woods-mathews.com/  This test is not yet approved or cleared by the [place] FDA and  has been authorized for detection and/or diagnosis of SARS-CoV-2 by FDA under an Emergency Use Authorization (EUA). This EUA will remain  in effect (meaning this test can be used) for the duration of the COVID-19 declaration under Se ction 564(b)(1) of the Act, 21 U.S.C. section 360bbb-3(b)(1), unless the authorization is terminated or revoked sooner.  Performed at [facility], [place] [address], [place], [place] [address]   Blood culture (routine x 2)     Status: None (Preliminary result)   Collection Time: 10/03/20  9:26 AM   Specimen: BLOOD  Result Value Ref Range Status   Specimen Description BLOOD BLOOD RIGHT HAND  Final   Special Requests   Final    Blood Culture adequate volume BOTTLES DRAWN AEROBIC AND ANAEROBIC   Culture   Final    NO GROWTH < 24 HOURS Performed at [facility], [address] [place], [place] [address]    Report Status PENDING  Incomplete  Blood culture (routine x 2)     Status: None (Preliminary result)   Collection Time: 10/03/20  9:26 AM   Specimen: BLOOD  Result Value Ref Range Status   Specimen Description BLOOD BLOOD LEFT HAND  Final   Special Requests   Final    Blood Culture adequate volume BOTTLES DRAWN AEROBIC AND ANAEROBIC   Culture   Final    NO GROWTH < 24 HOURS Performed at [facility], [address], [place], [place] [address]    Report Status PENDING  Incomplete  Resp Panel by RT-PCR (Flu A&B, Covid) Nasopharyngeal Swab     Status: None   Collection Time: 10/03/20 10:07 AM   Specimen: Nasopharyngeal Swab; Nasopharyngeal(NP) swabs in vial transport medium  Result Value Ref  Range Status   SARS Coronavirus 2 by RT PCR NEGATIVE NEGATIVE Final    Comment: (NOTE) SARS-CoV-2 target nucleic acids are NOT DETECTED.  The SARS-CoV-2 RNA is generally detectable in upper respiratory specimens during the acute phase of infection. The lowest concentration of SARS-CoV-2 viral copies this assay can detect is 138 copies/mL. A negative result does not preclude SARS-Cov-2 infection and should not be used as the sole basis for treatment or other patient management decisions. A negative result may occur with  improper specimen  collection/handling, submission of specimen other than nasopharyngeal swab, presence of viral mutation(s) within the areas targeted by this assay, and inadequate number of viral copies(<138 copies/mL). A negative result must be combined with clinical observations, patient history, and epidemiological information. The expected result is Negative.  Fact Sheet for Patients:  EntrepreneurPulse.com.au  Fact Sheet for Healthcare Providers:  IncredibleEmployment.be  This test is no t yet approved or cleared by the [place] FDA and  has been authorized for detection and/or diagnosis of SARS-CoV-2 by FDA under an Emergency Use Authorization (EUA). This EUA will remain  in effect (meaning this test can be used) for the duration of the COVID-19 declaration under Section 564(b)(1) of the Act, 21 U.S.C.section 360bbb-3(b)(1), unless the authorization is terminated  or revoked sooner.       Influenza A by PCR NEGATIVE NEGATIVE Final   Influenza B by PCR NEGATIVE NEGATIVE Final    Comment: (NOTE) The Xpert Xpress SARS-CoV-2/FLU/RSV plus assay is intended as an aid in the diagnosis of influenza from Nasopharyngeal swab specimens and should not be used as a sole basis for treatment. Nasal washings and aspirates are unacceptable for Xpert Xpress SARS-CoV-2/FLU/RSV testing.  Fact Sheet for  Patients: EntrepreneurPulse.com.au  Fact Sheet for Healthcare Providers: IncredibleEmployment.be  This test is not yet approved or cleared by the [place] FDA and has been authorized for detection and/or diagnosis of SARS-CoV-2 by FDA under an Emergency Use Authorization (EUA). This EUA will remain in effect (meaning this test can be used) for the duration of the COVID-19 declaration under Section 564(b)(1) of the Act, 21 U.S.C. section 360bbb-3(b)(1), unless the authorization is terminated or revoked.  Performed at [facility], [address] [place], [place] [address]   MRSA PCR Screening     Status: None   Collection Time: 10/03/20  4:12 PM   Specimen: Nasal Mucosa; Nasopharyngeal  Result Value Ref Range Status   MRSA by PCR NEGATIVE NEGATIVE Final    Comment:        The GeneXpert MRSA Assay (FDA approved for NASAL specimens only), is one component of a comprehensive MRSA colonization surveillance program. It is not intended to diagnose MRSA infection nor to guide or monitor treatment for MRSA infections. Performed at [facility], [address] [place], [place] [address]     Radiology Reports CT Head Wo Contrast  Result Date: 09/17/2020 CLINICAL DATA:  Found on floor altered EXAM: CT HEAD WITHOUT CONTRAST TECHNIQUE: Contiguous axial images were obtained from the base of the skull through the vertex without intravenous contrast. COMPARISON:  None. FINDINGS: Brain: No acute territorial infarction, hemorrhage, or intracranial mass. Mild atrophy. Minimal white matter hypodensity likely chronic small vessel ischemic change. The ventricles are nonenlarged Vascular: No hyperdense vessels.  Carotid vascular calcification Skull: Normal. Negative for fracture or focal lesion. Sinuses/Orbits: Abnormal hyperdense right globe with scleral band. Other: None IMPRESSION: 1. No CT evidence for acute intracranial abnormality. 2. Atrophy and minimal  small vessel ischemic changes of the white matter. 3. Abnormal hyperdense right globe, correlate with eye exam Electronically Signed   By: Donavan Foil M.D.   On: 09/17/2020 23:40   CT Cervical Spine Wo Contrast  Result Date: 09/17/2020 CLINICAL DATA:  Found on floor EXAM: CT CERVICAL SPINE WITHOUT CONTRAST TECHNIQUE: Multidetector CT imaging of the cervical spine was performed without intravenous contrast. Multiplanar CT image reconstructions were also generated. COMPARISON:  None. FINDINGS: Alignment: Sagittal alignment shows trace anterolisthesis C3 on C4. Facet alignment is within normal limits. There  is marked rotation of C1 on C2 presumably related to head positioning. Skull base and vertebrae: No acute fracture. No primary bone lesion or focal pathologic process. Soft tissues and spinal canal: No prevertebral fluid or swelling. No visible canal hematoma. Disc levels: Mild degenerative changes C4-C5 and C6-C7. Facet degenerative change at multiple levels Upper chest: Negative. Other: None IMPRESSION: No fracture identified. There is marked rotation of C1 on C2 which is presumably related to head positioning. Electronically Signed   By: Donavan Foil M.D.   On: 09/17/2020 23:46   MR BRAIN WO CONTRAST  Result Date: 09/18/2020 CLINICAL DATA:  Initial evaluation for acute mental status change. EXAM: MRI HEAD WITHOUT CONTRAST TECHNIQUE: Multiplanar, multiecho pulse sequences of the brain and surrounding structures were obtained without intravenous contrast. COMPARISON:  Prior head CT from 09/17/2020. FINDINGS: Brain: Examination technically limited as the patient was unable to tolerate the full length of the exam. Diffusion-weighted imaging, FLAIR, provided images are markedly degraded by motion artifact. Cerebral volume within normal limits for age. No abnormal foci of restricted diffusion to suggest acute or subacute ischemia. Gray-white matter differentiation maintained. No encephalomalacia to suggest  chronic cortical infarction. No evidence for acute or chronic intracranial hemorrhage. No visible mass lesion, mass effect or midline shift. No hydrocephalus or extra-axial fluid collection. And gradient echo sequence only were performed. Additionally, Vascular: Major intracranial vascular flow voids are grossly maintained at the skull base. Skull and upper cervical spine: Craniocervical junction not well assessed on this limited examination. Bone marrow signal intensity grossly within normal limits. No scalp soft tissue abnormality. Sinuses/Orbits: Postsurgical changes present at the right globe. Globes orbital soft tissues demonstrate no acute finding. Paranasal sinuses are grossly clear. No mastoid effusion. Other: There is question of a 1.6 cm FLAIR hyperintense lesion within the superficial lobe of the left parotid gland (series 7, image 1), partially visualized. Finding not entirely certain on this limited examination. IMPRESSION: 1. Technically limited exam due to the patient's inability to tolerate the full length of the study and motion artifact. 2. Grossly negative brain MRI for age. No acute intracranial abnormality identified. 3. Question 1.6 cm lesion within the superficial lobe of the left parotid gland, partially visualized. While this finding may be artifactual in nature, a possible primary parotid neoplasm could also be considered. Further evaluation with dedicated CT soft tissue neck with contrast suggested for further evaluation when the patient is able to tolerate. Electronically Signed   By: Jeannine Boga M.D.   On: 09/18/2020 21:43   DG Chest Port 1 View  Result Date: 10/03/2020 CLINICAL DATA:  Shortness of breath. EXAM: PORTABLE CHEST 1 VIEW COMPARISON:  September 17, 2020. FINDINGS: The heart size and mediastinal contours are within normal limits. Both lungs are clear. No pneumothorax or pleural effusion is noted. The visualized skeletal structures are unremarkable. IMPRESSION: No  active disease. Electronically Signed   By: Marijo Conception M.D.   On: 10/03/2020 09:36   DG Chest Portable 1 View  Result Date: 09/17/2020 CLINICAL DATA:  Altered level of consciousness, found down EXAM: PORTABLE CHEST 1 VIEW COMPARISON:  None. FINDINGS: The heart size and mediastinal contours are within normal limits. Both lungs are clear. The visualized skeletal structures are unremarkable. IMPRESSION: No active disease. Electronically Signed   By: Randa Ngo M.D.   On: 09/17/2020 18:33   DG Foot 2 Views Right  Result Date: 09/17/2020 CLINICAL DATA:  Found down, hyperglycemia, pain and swelling right foot EXAM: RIGHT FOOT - 2  VIEW COMPARISON:  None. FINDINGS: Frontal and lateral views of the right foot are obtained. Postsurgical changes are seen from prior ankle and hindfoot fusion. There is diffuse osteoarthritis throughout the midfoot and forefoot. Hammertoe deformities are noted. There are no acute displaced fractures. Diffuse soft tissue edema. No destructive bony lesions or periosteal reaction to suggest osteomyelitis. IMPRESSION: 1. Postsurgical changes from ankle and hindfoot fusion. 2. Diffuse osteoarthritis.  No acute or destructive bony lesions. 3. Diffuse soft tissue swelling. Electronically Signed   By: Randa Ngo M.D.   On: 09/17/2020 22:56   EEG adult  Result Date: 09/18/2020 Lora Havens, MD     09/18/2020  2:12 PM Patient Name: Lin Hackmann MRN: [id number] Epilepsy Attending: Lora Havens Referring Physician/Provider: Dr Eleonore Chiquito Date: 09/18/2020 Duration: 24.21 mins Patient history: 65 year old female with altered mental status.  EEG evaluate for seizures. Level of alertness: Awake, asleep AEDs during EEG study: None Technical aspects: This EEG study was done with scalp electrodes positioned according to the 10-20 International system of electrode placement. Electrical activity was acquired at a sampling rate of _0  and reviewed with a high frequency filter of _1  and  a low frequency filter of _2 . EEG data were recorded continuously and digitally stored. Description: No clear posterior dominant rhythm was seen.  Sleep was characterized by vertex waves, sleep spindles (12 to 14 Hz), maximal frontocentral region.  EEG showed continuous generalized 5 to 6 Hz theta slowing as well as intermittent generalized 2 to 3 Hz delta slowing. Hyperventilation and photic stimulation were not performed.   ABNORMALITY -Continuous slow, generalized IMPRESSION: This study is suggestive of moderate diffuse encephalopathy, nonspecific etiology. No seizures or epileptiform discharges were seen throughout the recording. Lora Havens   ECHOCARDIOGRAM COMPLETE  Result Date: 09/18/2020    ECHOCARDIOGRAM REPORT   Patient Name:   CASH MEADOW Date of Exam: 09/18/2020 Medical Rec #:  [id number]       Height:       65.0 in Accession #:    [id number]      Weight:       145.0 lb Date of Birth:  02/20/1956      BSA:          1.725 m Patient Age:    48 years        BP:           164/65 mmHg Patient Gender: F               HR:           106 bpm. Exam Location:  Inpatient Procedure: 2D Echo, Cardiac Doppler and Color Doppler Indications:    Elevated Troponin.  History:        Patient has no prior history of Echocardiogram examinations.                 TIA; Risk Factors:Hypertension and Diabetes.  Sonographer:    Jonelle Sidle Dance Referring Phys: [id number]  1. Left ventricular ejection fraction, by estimation, is >75%. The left ventricle has hyperdynamic function. The left ventricle has no regional wall motion abnormalities. There is mild concentric left ventricular hypertrophy. Left ventricular diastolic parameters are consistent with Grade I diastolic dysfunction (impaired relaxation).  2. Right ventricular systolic function is normal. The right ventricular size is normal. There is normal pulmonary artery systolic pressure.  3. The mitral valve is normal in structure. Trivial mitral  valve regurgitation.  4. The aortic valve is tricuspid. Aortic valve  regurgitation is not visualized. No aortic stenosis is present.  5. The inferior vena cava is normal in size with greater than 50% respiratory variability, suggesting right atrial pressure of 3 mmHg. Comparison(s): No prior Echocardiogram. FINDINGS  Left Ventricle: Left ventricular ejection fraction, by estimation, is >75%. The left ventricle has hyperdynamic function. The left ventricle has no regional wall motion abnormalities. The left ventricular internal cavity size was normal in size. There is mild concentric left ventricular hypertrophy. Left ventricular diastolic parameters are consistent with Grade I diastolic dysfunction (impaired relaxation). Right Ventricle: The right ventricular size is normal. No increase in right ventricular wall thickness. Right ventricular systolic function is normal. There is normal pulmonary artery systolic pressure. The tricuspid regurgitant velocity is 2.51 m/s, and  with an assumed right atrial pressure of 3 mmHg, the estimated right ventricular systolic pressure is 22.2 mmHg. Left Atrium: Left atrial size was normal in size. Right Atrium: Right atrial size was normal in size. Pericardium: There is no evidence of pericardial effusion. Mitral Valve: The mitral valve is normal in structure. There is mild thickening of the mitral valve leaflet(s). Trivial mitral valve regurgitation. Tricuspid Valve: The tricuspid valve is normal in structure. Tricuspid valve regurgitation is trivial. Aortic Valve: The aortic valve is tricuspid. Aortic valve regurgitation is not visualized. No aortic stenosis is present. Pulmonic Valve: The pulmonic valve was normal in structure. Pulmonic valve regurgitation is not visualized. Aorta: The aortic root and ascending aorta are structurally normal, with no evidence of dilitation. Venous: The inferior vena cava is normal in size with greater than 50% respiratory variability, suggesting  right atrial pressure of 3 mmHg. IAS/Shunts: No atrial level shunt detected by color flow Doppler.  LEFT VENTRICLE PLAX 2D LVIDd:         3.70 cm  Diastology LVIDs:         1.70 cm  LV e' medial:    7.07 cm/s LV PW:         1.20 cm  LV E/e' medial:  15.3 LV IVS:        1.00 cm  LV e' lateral:   11.20 cm/s LVOT diam:     1.90 cm  LV E/e' lateral: 9.6 LV SV:         81 LV SV Index:   47 LVOT Area:     2.84 cm  RIGHT VENTRICLE             IVC RV Basal diam:  2.50 cm     IVC diam: 1.80 cm RV S prime:     14.50 cm/s TAPSE (M-mode): 2.8 cm LEFT ATRIUM             Index       RIGHT ATRIUM           Index LA diam:        3.70 cm 2.14 cm/m  RA Area:     13.20 cm LA Vol (A2C):   35.6 ml 20.63 ml/m RA Volume:   30.60 ml  17.73 ml/m LA Vol (A4C):   32.1 ml 18.60 ml/m LA Biplane Vol: 34.1 ml 19.76 ml/m  AORTIC VALVE LVOT Vmax:   130.00 cm/s LVOT Vmean:  91.000 cm/s LVOT VTI:    0.284 m  AORTA Ao Root diam: 2.60 cm Ao Asc diam:  2.70 cm MITRAL VALVE                TRICUSPID VALVE MV Area (PHT): 3.27 cm     TR  Peak grad:   25.2 mmHg MV Decel Time: 232 msec     TR Vmax:        251.00 cm/s MV E velocity: 108.00 cm/s MV A velocity: 129.00 cm/s  SHUNTS MV E/A ratio:  0.84         Systemic VTI:  0.28 m                             Systemic Diam: 1.90 cm Gwyndolyn Kaufman MD Electronically signed by Gwyndolyn Kaufman MD Signature Date/Time: 09/18/2020/2:52:51 PM    Final    VAS [place] LOWER EXTREMITY VENOUS (DVT)  Result Date: 09/22/2020  Lower Venous DVT Study Indications: Edema. Other Indications: DKA,. Limitations: Altered mental status, would not follow directions. Comparison Study: No prior study on file Performing Technologist: Sharion Dove RVS  Examination Guidelines: A complete evaluation includes B-mode imaging, spectral Doppler, color Doppler, and power Doppler as needed of all accessible portions of each vessel. Bilateral testing is considered an integral part of a complete examination. Limited examinations for  reoccurring indications may be performed as noted. The reflux portion of the exam is performed with the patient in reverse Trendelenburg.  +---------+---------------+---------+-----------+----------+-------------------+ RIGHT    CompressibilityPhasicitySpontaneityPropertiesThrombus Aging      +---------+---------------+---------+-----------+----------+-------------------+ CFV      Full           Yes      Yes                                      +---------+---------------+---------+-----------+----------+-------------------+ SFJ      Full                                                             +---------+---------------+---------+-----------+----------+-------------------+ FV Prox  Full                                                             +---------+---------------+---------+-----------+----------+-------------------+ FV Mid   Full                                                             +---------+---------------+---------+-----------+----------+-------------------+ FV DistalFull                                                             +---------+---------------+---------+-----------+----------+-------------------+ PFV      Full                                                             +---------+---------------+---------+-----------+----------+-------------------+  POP                     Yes      Yes                  patent by color and                                                       Doppler             +---------+---------------+---------+-----------+----------+-------------------+ PTV                                                   Not well visualized +---------+---------------+---------+-----------+----------+-------------------+ PERO                                                  Not well visualized +---------+---------------+---------+-----------+----------+-------------------+    +---------+---------------+---------+-----------+----------+-------------------+ LEFT     CompressibilityPhasicitySpontaneityPropertiesThrombus Aging      +---------+---------------+---------+-----------+----------+-------------------+ CFV      Full           Yes      Yes                                      +---------+---------------+---------+-----------+----------+-------------------+ SFJ      Full                                                             +---------+---------------+---------+-----------+----------+-------------------+ FV Prox  Full                                                             +---------+---------------+---------+-----------+----------+-------------------+ FV Mid   Full                                                             +---------+---------------+---------+-----------+----------+-------------------+ FV DistalFull                                                             +---------+---------------+---------+-----------+----------+-------------------+ PFV      Full                                                             +---------+---------------+---------+-----------+----------+-------------------+  POP                     Yes      Yes                  patent by color and                                                       Doppler             +---------+---------------+---------+-----------+----------+-------------------+ PTV                                                   patent by color and                                                       Doppler             +---------+---------------+---------+-----------+----------+-------------------+ PERO                                                  Not well visualized +---------+---------------+---------+-----------+----------+-------------------+     Summary: RIGHT: - There is no evidence of deep vein thrombosis in the lower  extremity. However, portions of this examination were limited- see technologist comments above.  LEFT: - There is no evidence of deep vein thrombosis in the lower extremity. However, portions of this examination were limited- see technologist comments above.  *See table(s) above for measurements and observations. Electronically signed by Ruta Hinds MD on 09/22/2020 at 9:13:05 PM.    Final     SIGNED: Deatra James, MD, FHM. [facility],  Pager (please use [email or web address] to page/text) Please use Epic Secure Chat for non-urgent communication (7AM-7PM)  If 7PM-7AM, please contact night-coverage [email or web address], 10/04/2020, 11:21 AM

## 2020-10-04 NOTE — Plan of Care (Signed)
  Problem: Acute Rehab PT Goals(only PT should resolve) Goal: Pt Will Go Supine/Side To Sit 10/04/2020 1040 by Valeta Harms, PT Flowsheets (Taken 10/04/2020 1040) Pt will go Supine/Side to Sit: with supervision 10/04/2020 1040 by Valeta Harms, PT Outcome: Progressing Goal: Patient Will Transfer Sit To/From Stand 10/04/2020 1040 by Valeta Harms, PT Flowsheets (Taken 10/04/2020 1040) Patient will transfer sit to/from stand: with supervision 10/04/2020 1040 by Valeta Harms, PT Outcome: Progressing Goal: Pt Will Transfer Bed To Chair/Chair To Bed 10/04/2020 1040 by Valeta Harms, PT Flowsheets (Taken 10/04/2020 1040) Pt will Transfer Bed to Chair/Chair to Bed: with supervision 10/04/2020 1040 by Valeta Harms, PT Outcome: Progressing Goal: Pt Will Ambulate 10/04/2020 1040 by Valeta Harms, PT Flowsheets (Taken 10/04/2020 1040) Pt will Ambulate:  100 feet  with modified independence  with rolling walker 10/04/2020 1040 by Valeta Harms, PT Outcome: Progressing   10:41 AM,10/04/20 Esmeralda Links, PT, DPT Physical Therapist at [facility]

## 2020-10-04 NOTE — TOC Progression Note (Signed)
Transition of Care [facility]) - Progression Note    Patient Details  Name: Donna Harrell MRN: [id number] Date of Birth: Aug 13, 1955  [facility] of [facility]) CM/SW Contact  Barry Brunner, LCSW Phone Number: 10/04/2020, 4:14 PM  Clinical Narrative:    CSW verified with Eunice Blase from [place] that patient is okay to return on 10/05/2020.        Expected Discharge Plan and Services                                                 Social Determinants of Health (SDOH) Interventions    Readmission Risk Interventions No flowsheet data found.

## 2020-10-04 NOTE — Progress Notes (Signed)
Report called and given to Fleet Contras, RN on dept 300.

## 2020-10-04 NOTE — Plan of Care (Signed)
  Problem: Clinical Measurements: Goal: Will remain free from infection Outcome: Progressing Goal: Diagnostic test results will improve Outcome: Progressing Goal: Respiratory complications will improve Outcome: Progressing   Problem: Safety: Goal: Ability to remain free from injury will improve Outcome: Progressing   Problem: Skin Integrity: Goal: Risk for impaired skin integrity will decrease Outcome: Progressing   Problem: Education: Goal: Knowledge of General Education information will improve Description: Including pain rating scale, medication(s)/side effects and non-pharmacologic comfort measures 10/04/2020 0424 by Corrie Mckusick, RN Outcome: Not Progressing 10/04/2020 0423 by Corrie Mckusick, RN Outcome: Not Progressing Note: The patient remains confused   Problem: Nutrition: Goal: Adequate nutrition will be maintained Outcome: Not Progressing   Problem: Education: Goal: Knowledge of General Education information will improve Description: Including pain rating scale, medication(s)/side effects and non-pharmacologic comfort measures Outcome: Not Progressing

## 2020-10-04 NOTE — Care Management Obs Status (Signed)
MEDICARE OBSERVATION STATUS NOTIFICATION   Patient Details  Name: Stephaie Dardis MRN: [id number] Date of Birth: 07-16-1956   Medicare Observation Status Notification Given:  Yes    Barry Brunner, LCSW 10/04/2020, 2:01 PM

## 2020-10-05 DIAGNOSIS — E1010 Type 1 diabetes mellitus with ketoacidosis without coma: Secondary | ICD-10-CM | POA: Diagnosis not present

## 2020-10-05 DIAGNOSIS — E1069 Type 1 diabetes mellitus with other specified complication: Secondary | ICD-10-CM | POA: Diagnosis not present

## 2020-10-05 DIAGNOSIS — I10 Essential (primary) hypertension: Secondary | ICD-10-CM | POA: Diagnosis not present

## 2020-10-05 DIAGNOSIS — G9341 Metabolic encephalopathy: Secondary | ICD-10-CM | POA: Diagnosis not present

## 2020-10-05 LAB — CBC WITH DIFFERENTIAL/PLATELET
Abs Immature Granulocytes: 0.02 10*3/uL (ref 0.00–0.07)
Basophils Absolute: 0 10*3/uL (ref 0.0–0.1)
Basophils Relative: 1 %
Eosinophils Absolute: 0.2 10*3/uL (ref 0.0–0.5)
Eosinophils Relative: 2 %
HCT: 36.9 % (ref 36.0–46.0)
Hemoglobin: 11.4 g/dL — ABNORMAL LOW (ref 12.0–15.0)
Immature Granulocytes: 0 %
Lymphocytes Relative: 37 %
Lymphs Abs: 2.9 10*3/uL (ref 0.7–4.0)
MCH: 25.1 pg — ABNORMAL LOW (ref 26.0–34.0)
MCHC: 30.9 g/dL (ref 30.0–36.0)
MCV: 81.3 fL (ref 80.0–100.0)
Monocytes Absolute: 0.5 10*3/uL (ref 0.1–1.0)
Monocytes Relative: 6 %
Neutro Abs: 4.2 10*3/uL (ref 1.7–7.7)
Neutrophils Relative %: 54 %
Platelets: 225 10*3/uL (ref 150–400)
RBC: 4.54 MIL/uL (ref 3.87–5.11)
RDW: 15 % (ref 11.5–15.5)
WBC: 7.8 10*3/uL (ref 4.0–10.5)
nRBC: 0 % (ref 0.0–0.2)

## 2020-10-05 LAB — COMPREHENSIVE METABOLIC PANEL
ALT: 18 U/L (ref 0–44)
AST: 27 U/L (ref 15–41)
Albumin: 3 g/dL — ABNORMAL LOW (ref 3.5–5.0)
Alkaline Phosphatase: 58 U/L (ref 38–126)
Anion gap: 8 (ref 5–15)
BUN: <5 mg/dL — ABNORMAL LOW (ref 8–23)
CO2: 23 mmol/L (ref 22–32)
Calcium: 9 mg/dL (ref 8.9–10.3)
Chloride: 107 mmol/L (ref 98–111)
Creatinine, Ser: 0.6 mg/dL (ref 0.44–1.00)
GFR, Estimated: >60 mL/min (ref >60)
Glucose, Bld: 72 mg/dL (ref 70–99)
Potassium: 3 mmol/L — ABNORMAL LOW (ref 3.5–5.1)
Sodium: 138 mmol/L (ref 135–145)
Total Bilirubin: 0.8 mg/dL (ref 0.3–1.2)
Total Protein: 5.6 g/dL — ABNORMAL LOW (ref 6.5–8.1)

## 2020-10-05 LAB — GLUCOSE, CAPILLARY
Glucose-Capillary: 65 mg/dL — ABNORMAL LOW (ref 70–99)
Glucose-Capillary: 134 mg/dL — ABNORMAL HIGH (ref 70–99)
Glucose-Capillary: 142 mg/dL — ABNORMAL HIGH (ref 70–99)

## 2020-10-05 MED ORDER — POTASSIUM CHLORIDE CRYS ER 20 MEQ PO TBCR
40.0000 meq | EXTENDED_RELEASE_TABLET | Freq: Once | ORAL | Status: AC
  Administered 2020-10-05: 40 meq via ORAL
  Filled 2020-10-05: qty 2

## 2020-10-05 MED ORDER — DEXTROSE 50 % IV SOLN
12.5000 g | INTRAVENOUS | Status: AC
  Administered 2020-10-05: 12.5 g via INTRAVENOUS

## 2020-10-05 MED ORDER — INSULIN ASPART 100 UNIT/ML ~~LOC~~ SOLN: 0.0000 [IU] | Freq: Every day | SUBCUTANEOUS | 11 refills | Status: DC

## 2020-10-05 MED ORDER — INSULIN ASPART 100 UNIT/ML ~~LOC~~ SOLN: 0.0000 [IU] | Freq: Three times a day (TID) | SUBCUTANEOUS | 11 refills | Status: DC

## 2020-10-05 NOTE — TOC Transition Note (Signed)
Transition of Care [facility]) - CM/SW Discharge Note   Patient Details  Name: Donna Harrell MRN: [id number] Date of Birth: [birth year]  Transition of Care [facility]) CM/SW Contact:  Barry Brunner, LCSW Phone Number: 10/05/2020, 12:43 PM   Clinical Narrative:    CSW notified of patient's readiness for discharge. Debbie with [facility] agreeable to take patient. Med necessity completed by RN. CSW contacted EMS. TOC signing off.    Final next level of care: Skilled Nursing Facility Barriers to Discharge: Barriers Resolved   Patient Goals and CMS Choice Patient states their goals for this hospitalization and ongoing recovery are:: Return to SNF CMS Medicare.gov Compare Post Acute Care list provided to:: Patient Choice offered to / list presented to : Patient  Discharge Placement                Patient to be transferred to facility by: [facility] Name of family member notified: Annalee Genta Patient and family notified of of transfer: 10/05/20  Discharge Plan and Services                DME Arranged: N/A DME Agency: NA       HH Arranged: NA HH Agency: NA        Social Determinants of Health (SDOH) Interventions     Readmission Risk Interventions No flowsheet data found.

## 2020-10-05 NOTE — Progress Notes (Signed)
Patient being discharged to [facility]. Report called to receiving nurse at [facility]. IV removed. Discharge paper work placed in packet for receiving nurse and receiving facility. DNR formed also placed in packet.

## 2020-10-05 NOTE — Discharge Summary (Signed)
Physician Discharge Summary [facility]    Patient: Donna Harrell                   Admit date: 10/03/2020   DOB: 09/27/1955             Discharge date:10/05/2020/10:44 AM ACZ:[id number]                          PCP: Pcp, No  Disposition:   SNF   Recommendations for Outpatient Follow-up:   . Follow up: in 2 days with PCP, medical staff at SNF to reevaluate insulin regimen  Discharge Condition: Stable   Code Status:   Code Status: DNR  Diet recommendation: Diabetic diet   Discharge Diagnoses:    Principal Problem:   DKA (diabetic ketoacidosis) ([place]) Active Problems:   Essential hypertension   Acute metabolic encephalopathy   Type 1 diabetes mellitus with other specified complication ([place])   Depression   History of Present Illness/ Hospital Course Donna Harrell Summary:   CherylMaultsbyis a65 y.o.f w PMH/o  DM1, depression, hypertensionwho is originally from [place] discharge to SNF from [facility] on 09/30/2020---brought in from SNF facility  due to concern for altered mentation/confusion and found to have elevated glucose and pathophysiology consistent with DKA.  Patient was also confused, delirious on admission.  Had recent hospitalization for similar issues, was recently discharged on 09/30/2020, for DKA encephalopathy.   Assessment & Plan:   Principal Problem:   DKA (diabetic ketoacidosis) ([place]) Active Problems:   Essential hypertension   Acute metabolic encephalopathy   Type 1 diabetes mellitus with other specified complication ([facility])   Depression   DKA-diabetic ketoacidosis -Resolved  Recurrent, recent hospitalization for DKA was discharged on 09/30/2020 Readmitted yesterday for DKA 10/03/2020-from SNF  -patient met DKA criteria on admission with a bicarb of9, anion gap of28, serum glucose of591and beta hydroxybutyric acid of >> 8.0 -Responded to aggressive IV fluid resuscitation, insulin drip, beta hydroxybutyrate acid anion gap has  closed this morning -We will discontinue insulin drip, initiate long-acting insulin Lantus 10 units subcutaneous   -Per DKA protocol status post IV fluid resuscitation,  -Insulin regimen: Lantus 15 units nightly, continue with SSI coverage, We recommended to continue checking her blood sugar q. ACH S, with SSI coverage, Lantus dose may need to be adjusted as her oral intake improves.   Lactic acidosis -Resolved -Due to diabetic ketoacidosis, responded to IV fluid resuscitation, lactic acid continue to improve 7.1, 4.9, 2.5 this a.m. -No signs of infection, cultures no growth to date  AKI-- -resolved --acute kidney injurydue to dehydration in the setting of DKA -Improved creatinine back to baseline 0.75 this a.m. --creatinine on admission=1.81, baseline creatinine = 0.71,  -renally adjust medications, avoid nephrotoxic agents / dehydration / hypotension  Leukocytosis and Lactic Acidosis- -reactive likely due to DKA, no signs of infection -Resolved  4)Acute Metabolic Encephalopathy- -possible underlying dementia exacerbated by delirium DKA -multifactorial,metabolic derangement and major contributing factor -Back to baseline-poor insight  Hyperdense right globe - Likely from surgery in the past.  - Patient's right eyeball was found to be hyperdense on CT head. -As perpatient's sisterpthad 2 surgeries on right eye but her vision could not be restored   ParotidGlandLesion, incidental finding - MRI shows 1.6 cm lesion within the superficial lobe of the left parotid gland, partially visualized. Could be artifact versus primary neoplasm. - Dedicated CT soft tissue neck with contrast suggested when patient is more able to tolerate - will likely  need to be an outpatient workup at this point.  Failure to thrive - severe protein caloric malnutrition-- nutritional consult Nutritional status:  The patient's BMI is: Body mass index is 21.94 kg/m. Recommending  consulting dietitian at the facility, monitoring p.o. intake, dietary supplements    Dispo:The patient is from: SNF Anticipated d/c is to:SNF     Discharge Instructions:   Discharge Instructions    Activity as tolerated - No restrictions   Complete by: As directed    Diet Carb Modified   Complete by: As directed    Discharge instructions   Complete by: As directed    Please monitor blood sugars closely check her blood sugar q. ACHS cover with SSI, long-acting insulin may need to be adjusted as her diet n.p.o. improves   Discharge wound care:   Complete by: As directed    Per instructions   Increase activity slowly   Complete by: As directed        Medication List    STOP taking these medications   insulin lispro 100 UNIT/ML KwikPen Commonly known as: HUMALOG     TAKE these medications   amLODipine 5 MG tablet Commonly known as: NORVASC Take 1 tablet (5 mg total) by mouth daily.   atorvastatin 40 MG tablet Commonly known as: LIPITOR Take 40 mg by mouth daily.   clobetasol cream 0.05 % Commonly known as: TEMOVATE Apply 1 application topically every other day.   clopidogrel 75 MG tablet Commonly known as: PLAVIX Take 75 mg by mouth daily.   feeding supplement (GLUCERNA SHAKE) Liqd Take 237 mLs by mouth 2 (two) times daily between meals.   insulin aspart 100 UNIT/ML injection Commonly known as: novoLOG Inject 0-5 Units into the skin at bedtime.   insulin aspart 100 UNIT/ML injection Commonly known as: novoLOG Inject 0-15 Units into the skin 3 (three) times daily with meals.   insulin glargine 100 UNIT/ML injection Commonly known as: LANTUS Inject 0.15 mLs (15 Units total) into the skin at bedtime.   lisinopril 20 MG tablet Commonly known as: ZESTRIL Take 20 mg by mouth daily.       Not on File   Procedures /Studies:   CT Head Wo Contrast  Result Date: 09/17/2020 CLINICAL DATA:  Found on floor altered EXAM: CT HEAD WITHOUT  CONTRAST TECHNIQUE: Contiguous axial images were obtained from the base of the skull through the vertex without intravenous contrast. COMPARISON:  None. FINDINGS: Brain: No acute territorial infarction, hemorrhage, or intracranial mass. Mild atrophy. Minimal white matter hypodensity likely chronic small vessel ischemic change. The ventricles are nonenlarged Vascular: No hyperdense vessels.  Carotid vascular calcification Skull: Normal. Negative for fracture or focal lesion. Sinuses/Orbits: Abnormal hyperdense right globe with scleral band. Other: None IMPRESSION: 1. No CT evidence for acute intracranial abnormality. 2. Atrophy and minimal small vessel ischemic changes of the white matter. 3. Abnormal hyperdense right globe, correlate with eye exam Electronically Signed   By: Donavan Foil M.D.   On: 09/17/2020 23:40   CT Cervical Spine Wo Contrast  Result Date: 09/17/2020 CLINICAL DATA:  Found on floor EXAM: CT CERVICAL SPINE WITHOUT CONTRAST TECHNIQUE: Multidetector CT imaging of the cervical spine was performed without intravenous contrast. Multiplanar CT image reconstructions were also generated. COMPARISON:  None. FINDINGS: Alignment: Sagittal alignment shows trace anterolisthesis C3 on C4. Facet alignment is within normal limits. There is marked rotation of C1 on C2 presumably related to head positioning. Skull base and vertebrae: No acute fracture. No primary bone lesion  or focal pathologic process. Soft tissues and spinal canal: No prevertebral fluid or swelling. No visible canal hematoma. Disc levels: Mild degenerative changes C4-C5 and C6-C7. Facet degenerative change at multiple levels Upper chest: Negative. Other: None IMPRESSION: No fracture identified. There is marked rotation of C1 on C2 which is presumably related to head positioning. Electronically Signed   By: Donavan Foil M.D.   On: 09/17/2020 23:46   MR BRAIN WO CONTRAST  Result Date: 09/18/2020 CLINICAL DATA:  Initial evaluation for acute  mental status change. EXAM: MRI HEAD WITHOUT CONTRAST TECHNIQUE: Multiplanar, multiecho pulse sequences of the brain and surrounding structures were obtained without intravenous contrast. COMPARISON:  Prior head CT from 09/17/2020. FINDINGS: Brain: Examination technically limited as the patient was unable to tolerate the full length of the exam. Diffusion-weighted imaging, FLAIR, provided images are markedly degraded by motion artifact. Cerebral volume within normal limits for age. No abnormal foci of restricted diffusion to suggest acute or subacute ischemia. Gray-white matter differentiation maintained. No encephalomalacia to suggest chronic cortical infarction. No evidence for acute or chronic intracranial hemorrhage. No visible mass lesion, mass effect or midline shift. No hydrocephalus or extra-axial fluid collection. And gradient echo sequence only were performed. Additionally, Vascular: Major intracranial vascular flow voids are grossly maintained at the skull base. Skull and upper cervical spine: Craniocervical junction not well assessed on this limited examination. Bone marrow signal intensity grossly within normal limits. No scalp soft tissue abnormality. Sinuses/Orbits: Postsurgical changes present at the right globe. Globes orbital soft tissues demonstrate no acute finding. Paranasal sinuses are grossly clear. No mastoid effusion. Other: There is question of a 1.6 cm FLAIR hyperintense lesion within the superficial lobe of the left parotid gland (series 7, image 1), partially visualized. Finding not entirely certain on this limited examination. IMPRESSION: 1. Technically limited exam due to the patient's inability to tolerate the full length of the study and motion artifact. 2. Grossly negative brain MRI for age. No acute intracranial abnormality identified. 3. Question 1.6 cm lesion within the superficial lobe of the left parotid gland, partially visualized. While this finding may be artifactual in  nature, a possible primary parotid neoplasm could also be considered. Further evaluation with dedicated CT soft tissue neck with contrast suggested for further evaluation when the patient is able to tolerate. Electronically Signed   By: Jeannine Boga M.D.   On: 09/18/2020 21:43   DG Chest Port 1 View  Result Date: 10/03/2020 CLINICAL DATA:  Shortness of breath. EXAM: PORTABLE CHEST 1 VIEW COMPARISON:  September 17, 2020. FINDINGS: The heart size and mediastinal contours are within normal limits. Both lungs are clear. No pneumothorax or pleural effusion is noted. The visualized skeletal structures are unremarkable. IMPRESSION: No active disease. Electronically Signed   By: Marijo Conception M.D.   On: 10/03/2020 09:36   DG Chest Portable 1 View  Result Date: 09/17/2020 CLINICAL DATA:  Altered level of consciousness, found down EXAM: PORTABLE CHEST 1 VIEW COMPARISON:  None. FINDINGS: The heart size and mediastinal contours are within normal limits. Both lungs are clear. The visualized skeletal structures are unremarkable. IMPRESSION: No active disease. Electronically Signed   By: Randa Ngo M.D.   On: 09/17/2020 18:33   DG Foot 2 Views Right  Result Date: 09/17/2020 CLINICAL DATA:  Found down, hyperglycemia, pain and swelling right foot EXAM: RIGHT FOOT - 2 VIEW COMPARISON:  None. FINDINGS: Frontal and lateral views of the right foot are obtained. Postsurgical changes are seen from prior ankle and  hindfoot fusion. There is diffuse osteoarthritis throughout the midfoot and forefoot. Hammertoe deformities are noted. There are no acute displaced fractures. Diffuse soft tissue edema. No destructive bony lesions or periosteal reaction to suggest osteomyelitis. IMPRESSION: 1. Postsurgical changes from ankle and hindfoot fusion. 2. Diffuse osteoarthritis.  No acute or destructive bony lesions. 3. Diffuse soft tissue swelling. Electronically Signed   By: Randa Ngo M.D.   On: 09/17/2020 22:56   EEG  adult  Result Date: 09/18/2020 Lora Havens, MD     09/18/2020  2:12 PM Patient Name: Sherran Margolis MRN: [id number] Epilepsy Attending: Lora Havens Referring Physician/Provider: Dr Eleonore Chiquito Date: 09/18/2020 Duration: 24.21 mins Patient history: 64 year old female with altered mental status.  EEG evaluate for seizures. Level of alertness: Awake, asleep AEDs during EEG study: None Technical aspects: This EEG study was done with scalp electrodes positioned according to the 10-20 International system of electrode placement. Electrical activity was acquired at a sampling rate of 500Hz  and reviewed with a high frequency filter of 70Hz  and a low frequency filter of 1Hz . EEG data were recorded continuously and digitally stored. Description: No clear posterior dominant rhythm was seen.  Sleep was characterized by vertex waves, sleep spindles (12 to 14 Hz), maximal frontocentral region.  EEG showed continuous generalized 5 to 6 Hz theta slowing as well as intermittent generalized 2 to 3 Hz delta slowing. Hyperventilation and photic stimulation were not performed.   ABNORMALITY -Continuous slow, generalized IMPRESSION: This study is suggestive of moderate diffuse encephalopathy, nonspecific etiology. No seizures or epileptiform discharges were seen throughout the recording. Lora Havens   ECHOCARDIOGRAM COMPLETE  Result Date: 09/18/2020    ECHOCARDIOGRAM REPORT   Patient Name:   MARLYSS CISSELL Date of Exam: 09/18/2020 Medical Rec #:  [id number]       Height:       65.0 in Accession #:    [id number]      Weight:       145.0 lb Date of Birth:  [birth year]      BSA:          1.725 m Patient Age:    35 years        BP:           164/65 mmHg Patient Gender: F               HR:           106 bpm. Exam Location:  Inpatient Procedure: 2D Echo, Cardiac Doppler and Color Doppler Indications:    Elevated Troponin.  History:        Patient has no prior history of Echocardiogram examinations.                 TIA; Risk  Factors:Hypertension and Diabetes.  Sonographer:    Jonelle Sidle Dance Referring Phys: [id number] [place]  1. Left ventricular ejection fraction, by estimation, is >75%. The left ventricle has hyperdynamic function. The left ventricle has no regional wall motion abnormalities. There is mild concentric left ventricular hypertrophy. Left ventricular diastolic parameters are consistent with Grade I diastolic dysfunction (impaired relaxation).  2. Right ventricular systolic function is normal. The right ventricular size is normal. There is normal pulmonary artery systolic pressure.  3. The mitral valve is normal in structure. Trivial mitral valve regurgitation.  4. The aortic valve is tricuspid. Aortic valve regurgitation is not visualized. No aortic stenosis is present.  5. The inferior vena cava is normal in size with greater than 50%  respiratory variability, suggesting right atrial pressure of 3 mmHg. Comparison(s): No prior Echocardiogram. FINDINGS  Left Ventricle: Left ventricular ejection fraction, by estimation, is >75%. The left ventricle has hyperdynamic function. The left ventricle has no regional wall motion abnormalities. The left ventricular internal cavity size was normal in size. There is mild concentric left ventricular hypertrophy. Left ventricular diastolic parameters are consistent with Grade I diastolic dysfunction (impaired relaxation). Right Ventricle: The right ventricular size is normal. No increase in right ventricular wall thickness. Right ventricular systolic function is normal. There is normal pulmonary artery systolic pressure. The tricuspid regurgitant velocity is 2.51 m/s, and  with an assumed right atrial pressure of 3 mmHg, the estimated right ventricular systolic pressure is 68.1 mmHg. Left Atrium: Left atrial size was normal in size. Right Atrium: Right atrial size was normal in size. Pericardium: There is no evidence of pericardial effusion. Mitral Valve: The mitral valve  is normal in structure. There is mild thickening of the mitral valve leaflet(s). Trivial mitral valve regurgitation. Tricuspid Valve: The tricuspid valve is normal in structure. Tricuspid valve regurgitation is trivial. Aortic Valve: The aortic valve is tricuspid. Aortic valve regurgitation is not visualized. No aortic stenosis is present. Pulmonic Valve: The pulmonic valve was normal in structure. Pulmonic valve regurgitation is not visualized. Aorta: The aortic root and ascending aorta are structurally normal, with no evidence of dilitation. Venous: The inferior vena cava is normal in size with greater than 50% respiratory variability, suggesting right atrial pressure of 3 mmHg. IAS/Shunts: No atrial level shunt detected by color flow Doppler.  LEFT VENTRICLE PLAX 2D LVIDd:         3.70 cm  Diastology LVIDs:         1.70 cm  LV e' medial:    7.07 cm/s LV PW:         1.20 cm  LV E/e' medial:  15.3 LV IVS:        1.00 cm  LV e' lateral:   11.20 cm/s LVOT diam:     1.90 cm  LV E/e' lateral: 9.6 LV SV:         81 LV SV Index:   47 LVOT Area:     2.84 cm  RIGHT VENTRICLE             IVC RV Basal diam:  2.50 cm     IVC diam: 1.80 cm RV S prime:     14.50 cm/s TAPSE (M-mode): 2.8 cm LEFT ATRIUM             Index       RIGHT ATRIUM           Index LA diam:        3.70 cm 2.14 cm/m  RA Area:     13.20 cm LA Vol (A2C):   35.6 ml 20.63 ml/m RA Volume:   30.60 ml  17.73 ml/m LA Vol (A4C):   32.1 ml 18.60 ml/m LA Biplane Vol: 34.1 ml 19.76 ml/m  AORTIC VALVE LVOT Vmax:   130.00 cm/s LVOT Vmean:  91.000 cm/s LVOT VTI:    0.284 m  AORTA Ao Root diam: 2.60 cm Ao Asc diam:  2.70 cm MITRAL VALVE                TRICUSPID VALVE MV Area (PHT): 3.27 cm     TR Peak grad:   25.2 mmHg MV Decel Time: 232 msec     TR Vmax:  251.00 cm/s MV E velocity: 108.00 cm/s MV A velocity: 129.00 cm/s  SHUNTS MV E/A ratio:  0.84         Systemic VTI:  0.28 m                             Systemic Diam: 1.90 cm Gwyndolyn Kaufman MD  Electronically signed by Gwyndolyn Kaufman MD Signature Date/Time: 09/18/2020/2:52:51 PM    Final    VAS [place] LOWER EXTREMITY VENOUS (DVT)  Result Date: 09/22/2020  Lower Venous DVT Study Indications: Edema. Other Indications: DKA,. Limitations: Altered mental status, would not follow directions. Comparison Study: No prior study on file Performing Technologist: Sharion Dove RVS  Examination Guidelines: A complete evaluation includes B-mode imaging, spectral Doppler, color Doppler, and power Doppler as needed of all accessible portions of each vessel. Bilateral testing is considered an integral part of a complete examination. Limited examinations for reoccurring indications may be performed as noted. The reflux portion of the exam is performed with the patient in reverse Trendelenburg.  +---------+---------------+---------+-----------+----------+-------------------+ RIGHT    CompressibilityPhasicitySpontaneityPropertiesThrombus Aging      +---------+---------------+---------+-----------+----------+-------------------+ CFV      Full           Yes      Yes                                      +---------+---------------+---------+-----------+----------+-------------------+ SFJ      Full                                                             +---------+---------------+---------+-----------+----------+-------------------+ FV Prox  Full                                                             +---------+---------------+---------+-----------+----------+-------------------+ FV Mid   Full                                                             +---------+---------------+---------+-----------+----------+-------------------+ FV DistalFull                                                             +---------+---------------+---------+-----------+----------+-------------------+ PFV      Full                                                              +---------+---------------+---------+-----------+----------+-------------------+ POP  Yes      Yes                  patent by color and                                                       Doppler             +---------+---------------+---------+-----------+----------+-------------------+ PTV                                                   Not well visualized +---------+---------------+---------+-----------+----------+-------------------+ PERO                                                  Not well visualized +---------+---------------+---------+-----------+----------+-------------------+   +---------+---------------+---------+-----------+----------+-------------------+ LEFT     CompressibilityPhasicitySpontaneityPropertiesThrombus Aging      +---------+---------------+---------+-----------+----------+-------------------+ CFV      Full           Yes      Yes                                      +---------+---------------+---------+-----------+----------+-------------------+ SFJ      Full                                                             +---------+---------------+---------+-----------+----------+-------------------+ FV Prox  Full                                                             +---------+---------------+---------+-----------+----------+-------------------+ FV Mid   Full                                                             +---------+---------------+---------+-----------+----------+-------------------+ FV DistalFull                                                             +---------+---------------+---------+-----------+----------+-------------------+ PFV      Full                                                             +---------+---------------+---------+-----------+----------+-------------------+  POP                     Yes      Yes                  patent by color and                                                        Doppler             +---------+---------------+---------+-----------+----------+-------------------+ PTV                                                   patent by color and                                                       Doppler             +---------+---------------+---------+-----------+----------+-------------------+ PERO                                                  Not well visualized +---------+---------------+---------+-----------+----------+-------------------+     Summary: RIGHT: - There is no evidence of deep vein thrombosis in the lower extremity. However, portions of this examination were limited- see technologist comments above.  LEFT: - There is no evidence of deep vein thrombosis in the lower extremity. However, portions of this examination were limited- see technologist comments above.  *See table(s) above for measurements and observations. Electronically signed by Ruta Hinds MD on 09/22/2020 at 9:13:05 PM.    Final      Subjective:   Patient was seen and examined 10/05/2020, 10:44 AM Patient stable today. No acute distress.  No issues overnight Stable for discharge.  Discharge Exam:    Vitals:   10/04/20 2200 10/05/20 0257 10/05/20 0510 10/05/20 1010  BP: (!) 149/70 (!) 147/74 134/62 (!) 150/79  Pulse: 92 84 79   Resp: 16 20 (!) 22   Temp: 98 F (36.7 C) 98.5 F (36.9 C) 99.3 F (37.4 C)   TempSrc: Oral Oral Oral   SpO2: 100% 99% 99%   Weight:      Height:        General: Pt lying comfortably in bed & appears in no obvious distress. Cardiovascular: S1 & S2 heard, RRR, S1/S2 +. No murmurs, rubs, gallops or clicks. No JVD or pedal edema. Respiratory: Clear to auscultation without wheezing, rhonchi or crackles. No increased work of breathing. Abdominal:  Non-distended, non-tender & soft. No organomegaly or masses appreciated. Normal bowel sounds heard. CNS: Alert and oriented. No  focal deficits. Extremities: no edema, no cyanosis      The results of significant diagnostics from this hospitalization (including imaging, microbiology, ancillary and laboratory) are listed below for reference.      Microbiology:   Recent Results (from the past 240 hour(s))  SARS CORONAVIRUS 2 (  TAT 6-24 HRS) Nasopharyngeal Nasopharyngeal Swab     Status: None   Collection Time: 09/29/20 11:15 AM   Specimen: Nasopharyngeal Swab  Result Value Ref Range Status   SARS Coronavirus 2 NEGATIVE NEGATIVE Final    Comment: (NOTE) SARS-CoV-2 target nucleic acids are NOT DETECTED.  The SARS-CoV-2 RNA is generally detectable in upper and lower respiratory specimens during the acute phase of infection. Negative results do not preclude SARS-CoV-2 infection, do not rule out co-infections with other pathogens, and should not be used as the sole basis for treatment or other patient management decisions. Negative results must be combined with clinical observations, patient history, and epidemiological information. The expected result is Negative.  Fact Sheet for Patients: SugarRoll.be  Fact Sheet for Healthcare Providers: https://www.woods-mathews.com/  This test is not yet approved or cleared by the [place] FDA and  has been authorized for detection and/or diagnosis of SARS-CoV-2 by FDA under an Emergency Use Authorization (EUA). This EUA will remain  in effect (meaning this test can be used) for the duration of the COVID-19 declaration under Se ction 564(b)(1) of the Act, 21 U.S.C. section 360bbb-3(b)(1), unless the authorization is terminated or revoked sooner.  Performed at [facility], [place] [address], [place], [place] [address]   Blood culture (routine x 2)     Status: None (Preliminary result)   Collection Time: 10/03/20  9:26 AM   Specimen: BLOOD  Result Value Ref Range Status   Specimen Description BLOOD BLOOD RIGHT HAND   Final   Special Requests   Final    Blood Culture adequate volume BOTTLES DRAWN AEROBIC AND ANAEROBIC   Culture   Final    NO GROWTH 2 DAYS Performed at [facility], [address], [place], [place] [address]    Report Status PENDING  Incomplete  Blood culture (routine x 2)     Status: None (Preliminary result)   Collection Time: 10/03/20  9:26 AM   Specimen: BLOOD  Result Value Ref Range Status   Specimen Description BLOOD BLOOD LEFT HAND  Final   Special Requests   Final    Blood Culture adequate volume BOTTLES DRAWN AEROBIC AND ANAEROBIC   Culture   Final    NO GROWTH 2 DAYS Performed at [facility], [address], [place], [place] [address]    Report Status PENDING  Incomplete  Resp Panel by RT-PCR (Flu A&B, Covid) Nasopharyngeal Swab     Status: None   Collection Time: 10/03/20 10:07 AM   Specimen: Nasopharyngeal Swab; Nasopharyngeal(NP) swabs in vial transport medium  Result Value Ref Range Status   SARS Coronavirus 2 by RT PCR NEGATIVE NEGATIVE Final    Comment: (NOTE) SARS-CoV-2 target nucleic acids are NOT DETECTED.  The SARS-CoV-2 RNA is generally detectable in upper respiratory specimens during the acute phase of infection. The lowest concentration of SARS-CoV-2 viral copies this assay can detect is 138 copies/mL. A negative result does not preclude SARS-Cov-2 infection and should not be used as the sole basis for treatment or other patient management decisions. A negative result may occur with  improper specimen collection/handling, submission of specimen other than nasopharyngeal swab, presence of viral mutation(s) within the areas targeted by this assay, and inadequate number of viral copies(<138 copies/mL). A negative result must be combined with clinical observations, patient history, and epidemiological information. The expected result is Negative.  Fact Sheet for Patients:  EntrepreneurPulse.com.au  Fact Sheet for Healthcare Providers:   IncredibleEmployment.be  This test is no t yet  approved or cleared by the [place] and  has been authorized for detection and/or diagnosis of SARS-CoV-2 by FDA under an Emergency Use Authorization (EUA). This EUA will remain  in effect (meaning this test can be used) for the duration of the COVID-19 declaration under Section 564(b)(1) of the Act, 21 U.S.C.section 360bbb-3(b)(1), unless the authorization is terminated  or revoked sooner.       Influenza A by PCR NEGATIVE NEGATIVE Final   Influenza B by PCR NEGATIVE NEGATIVE Final    Comment: (NOTE) The Xpert Xpress SARS-CoV-2/FLU/RSV plus assay is intended as an aid in the diagnosis of influenza from Nasopharyngeal swab specimens and should not be used as a sole basis for treatment. Nasal washings and aspirates are unacceptable for Xpert Xpress SARS-CoV-2/FLU/RSV testing.  Fact Sheet for Patients: EntrepreneurPulse.com.au  Fact Sheet for Healthcare Providers: IncredibleEmployment.be  This test is not yet approved or cleared by the [place] FDA and has been authorized for detection and/or diagnosis of SARS-CoV-2 by FDA under an Emergency Use Authorization (EUA). This EUA will remain in effect (meaning this test can be used) for the duration of the COVID-19 declaration under Section 564(b)(1) of the Act, 21 U.S.C. section 360bbb-3(b)(1), unless the authorization is terminated or revoked.  Performed at [facility], [address], [place], [place] [address]   MRSA PCR Screening     Status: None   Collection Time: 10/03/20  4:12 PM   Specimen: Nasal Mucosa; Nasopharyngeal  Result Value Ref Range Status   MRSA by PCR NEGATIVE NEGATIVE Final    Comment:        The GeneXpert MRSA Assay (FDA approved for NASAL specimens only), is one component of a comprehensive MRSA colonization surveillance program. It is not intended to diagnose MRSA infection nor to guide  or monitor treatment for MRSA infections. Performed at [facility], [address] [place], [place] [address]      Labs:   CBC: Recent Labs  Lab 10/03/20 0816 10/04/20 0913 10/05/20 0758  WBC 31.4* 18.3* 7.8  NEUTROABS 28.2* 14.8* 4.2  HGB 12.9 11.9* 11.4*  HCT 44.2 38.0 36.9  MCV 86.5 81.4 81.3  PLT 264 231 098   Basic Metabolic Panel: Recent Labs  Lab 10/03/20 1104 10/03/20 1526 10/03/20 1955 10/04/20 0140 10/05/20 0758  NA 138 140 138 141 138  K 4.0 4.7 4.4 3.8 3.0*  CL 105 109 109 111 107  CO2 12* 18* 20* 22 23  GLUCOSE 379* 180* 171* 142* 72  BUN 21 17 15 13  <5*  CREATININE 1.51* 1.00 0.90 0.75 0.60  CALCIUM 9.8 9.3 9.0 9.2 9.0   Liver Function Tests: Recent Labs  Lab 10/03/20 0816 10/05/20 0758  AST 31 27  ALT 23 18  ALKPHOS 84 58  BILITOT 1.5* 0.8  PROT 7.9 5.6*  ALBUMIN 4.1 3.0*   BNP (last 3 results) No results for input(s): BNP in the last 8760 hours. Cardiac Enzymes: No results for input(s): CKTOTAL, CKMB, CKMBINDEX, TROPONINI in the last 168 hours. CBG: Recent Labs  Lab 10/04/20 1131 10/04/20 1604 10/04/20 2038 10/05/20 0734 10/05/20 0835  GLUCAP 162* 116* 134* 65* 134*   Hgb A1c No results for input(s): HGBA1C in the last 72 hours. Lipid Profile No results for input(s): CHOL, HDL, LDLCALC, TRIG, CHOLHDL, LDLDIRECT in the last 72 hours. Thyroid function studies No results for input(s): TSH, T4TOTAL, T3FREE, THYROIDAB in the last 72 hours.  Invalid input(s): FREET3 Anemia work up No results for input(s): VITAMINB12, FOLATE,  FERRITIN, TIBC, IRON, RETICCTPCT in the last 72 hours. Urinalysis    Component Value Date/Time   COLORURINE YELLOW 10/03/2020 1030   APPEARANCEUR CLEAR 10/03/2020 1030   LABSPEC 1.018 10/03/2020 1030   PHURINE 5.0 10/03/2020 1030   GLUCOSEU >=500 (A) 10/03/2020 1030   HGBUR NEGATIVE 10/03/2020 1030   BILIRUBINUR NEGATIVE 10/03/2020 1030   KETONESUR 80 (A) 10/03/2020 1030   PROTEINUR NEGATIVE  10/03/2020 1030   NITRITE NEGATIVE 10/03/2020 1030   LEUKOCYTESUR NEGATIVE 10/03/2020 1030         Time coordinating discharge: Over 45 minutes  SIGNED: Deatra James, MD, FACP, FHM. [facility],  Please use [email or web address] to Page If 7PM-7AM, please contact night-coverage [email or web address] 10/05/2020, 10:44 AM

## 2020-10-08 ENCOUNTER — Ambulatory Visit (INDEPENDENT_AMBULATORY_CARE_PROVIDER_SITE_OTHER): Payer: Self-pay | Admitting: Adult Health

## 2020-10-08 DIAGNOSIS — Z5329 Procedure and treatment not carried out because of patient's decision for other reasons: Secondary | ICD-10-CM

## 2020-10-08 LAB — CULTURE, BLOOD (ROUTINE X 2)
Culture: NO GROWTH
Culture: NO GROWTH
Special Requests: ADEQUATE
Special Requests: ADEQUATE

## 2020-10-08 NOTE — Progress Notes (Signed)
No show for new patient appointment.  

## 2020-10-25 ENCOUNTER — Emergency Department (HOSPITAL_COMMUNITY): Payer: Medicare Other

## 2020-10-25 ENCOUNTER — Inpatient Hospital Stay (HOSPITAL_COMMUNITY)
Admission: EM | Admit: 2020-10-25 | Discharge: 2020-10-27 | DRG: 637 | Disposition: A | Discharge status: Home / Self Care | Payer: Medicare Other | Attending: Internal Medicine | Admitting: Internal Medicine

## 2020-10-25 ENCOUNTER — Other Ambulatory Visit: Payer: Self-pay

## 2020-10-25 ENCOUNTER — Inpatient Hospital Stay (HOSPITAL_COMMUNITY): Payer: Medicare Other

## 2020-10-25 DIAGNOSIS — Z66 Do not resuscitate: Secondary | ICD-10-CM | POA: Diagnosis present

## 2020-10-25 DIAGNOSIS — Z79899 Other long term (current) drug therapy: Secondary | ICD-10-CM | POA: Diagnosis not present

## 2020-10-25 DIAGNOSIS — I119 Hypertensive heart disease without heart failure: Secondary | ICD-10-CM | POA: Diagnosis present

## 2020-10-25 DIAGNOSIS — Z8673 Personal history of transient ischemic attack (TIA), and cerebral infarction without residual deficits: Secondary | ICD-10-CM | POA: Diagnosis not present

## 2020-10-25 DIAGNOSIS — E1110 Type 2 diabetes mellitus with ketoacidosis without coma: Secondary | ICD-10-CM | POA: Diagnosis not present

## 2020-10-25 DIAGNOSIS — Z7189 Other specified counseling: Secondary | ICD-10-CM

## 2020-10-25 DIAGNOSIS — E10649 Type 1 diabetes mellitus with hypoglycemia without coma: Secondary | ICD-10-CM | POA: Diagnosis not present

## 2020-10-25 DIAGNOSIS — Z794 Long term (current) use of insulin: Secondary | ICD-10-CM | POA: Diagnosis not present

## 2020-10-25 DIAGNOSIS — M7989 Other specified soft tissue disorders: Secondary | ICD-10-CM | POA: Diagnosis present

## 2020-10-25 DIAGNOSIS — Z20822 Contact with and (suspected) exposure to covid-19: Secondary | ICD-10-CM | POA: Diagnosis present

## 2020-10-25 DIAGNOSIS — E1069 Type 1 diabetes mellitus with other specified complication: Secondary | ICD-10-CM | POA: Diagnosis present

## 2020-10-25 DIAGNOSIS — N179 Acute kidney failure, unspecified: Secondary | ICD-10-CM | POA: Diagnosis present

## 2020-10-25 DIAGNOSIS — G9341 Metabolic encephalopathy: Secondary | ICD-10-CM | POA: Diagnosis present

## 2020-10-25 DIAGNOSIS — D72829 Elevated white blood cell count, unspecified: Secondary | ICD-10-CM | POA: Diagnosis present

## 2020-10-25 DIAGNOSIS — E1010 Type 1 diabetes mellitus with ketoacidosis without coma: Principal | ICD-10-CM | POA: Diagnosis present

## 2020-10-25 DIAGNOSIS — Z87891 Personal history of nicotine dependence: Secondary | ICD-10-CM | POA: Diagnosis not present

## 2020-10-25 DIAGNOSIS — Z7902 Long term (current) use of antithrombotics/antiplatelets: Secondary | ICD-10-CM | POA: Diagnosis not present

## 2020-10-25 DIAGNOSIS — H548 Legal blindness, as defined in USA: Secondary | ICD-10-CM | POA: Diagnosis present

## 2020-10-25 DIAGNOSIS — I10 Essential (primary) hypertension: Secondary | ICD-10-CM | POA: Diagnosis present

## 2020-10-25 DIAGNOSIS — R4182 Altered mental status, unspecified: Secondary | ICD-10-CM | POA: Diagnosis present

## 2020-10-25 LAB — COMPREHENSIVE METABOLIC PANEL
ALT: 20 U/L (ref 0–44)
AST: 22 U/L (ref 15–41)
Albumin: 4.1 g/dL (ref 3.5–5.0)
Alkaline Phosphatase: 77 U/L (ref 38–126)
Anion gap: 23 — ABNORMAL HIGH (ref 5–15)
BUN: 23 mg/dL (ref 8–23)
CO2: 13 mmol/L — ABNORMAL LOW (ref 22–32)
Calcium: 9.6 mg/dL (ref 8.9–10.3)
Chloride: 97 mmol/L — ABNORMAL LOW (ref 98–111)
Creatinine, Ser: 1.37 mg/dL — ABNORMAL HIGH (ref 0.44–1.00)
GFR, Estimated: 44 mL/min — ABNORMAL LOW (ref >60)
Glucose, Bld: 658 mg/dL (ref 70–99)
Potassium: 5.3 mmol/L — ABNORMAL HIGH (ref 3.5–5.1)
Sodium: 133 mmol/L — ABNORMAL LOW (ref 135–145)
Total Bilirubin: 1.8 mg/dL — ABNORMAL HIGH (ref 0.3–1.2)
Total Protein: 7 g/dL (ref 6.5–8.1)

## 2020-10-25 LAB — MAGNESIUM: Magnesium: 1.8 mg/dL (ref 1.7–2.4)

## 2020-10-25 LAB — BLOOD GAS, VENOUS
Acid-base deficit: 16.4 mmol/L — ABNORMAL HIGH (ref 0.0–2.0)
Bicarbonate: 9.6 mmol/L — ABNORMAL LOW (ref 20.0–28.0)
FIO2: 21
O2 Saturation: 69.4 %
Patient temperature: 98.6
pCO2, Ven: 23.8 mmHg — ABNORMAL LOW (ref 44.0–60.0)
pH, Ven: 7.231 — ABNORMAL LOW (ref 7.250–7.430)
pO2, Ven: 44.1 mmHg (ref 32.0–45.0)

## 2020-10-25 LAB — CBG MONITORING, ED
Glucose-Capillary: 586 mg/dL (ref 70–99)
Glucose-Capillary: >600 mg/dL (ref 70–99)
Glucose-Capillary: 415 mg/dL — ABNORMAL HIGH (ref 70–99)
Glucose-Capillary: 452 mg/dL — ABNORMAL HIGH (ref 70–99)
Glucose-Capillary: 380 mg/dL — ABNORMAL HIGH (ref 70–99)
Glucose-Capillary: 282 mg/dL — ABNORMAL HIGH (ref 70–99)
Glucose-Capillary: 335 mg/dL — ABNORMAL HIGH (ref 70–99)

## 2020-10-25 LAB — URINALYSIS, ROUTINE W REFLEX MICROSCOPIC
Bacteria, UA: NONE SEEN
Bilirubin Urine: NEGATIVE
Glucose, UA: >=500 mg/dL — AB
Hgb urine dipstick: NEGATIVE
Ketones, ur: 80 mg/dL — AB
Leukocytes,Ua: NEGATIVE
Nitrite: NEGATIVE
Protein, ur: NEGATIVE mg/dL
Specific Gravity, Urine: 1.017 (ref 1.005–1.030)
pH: 5 (ref 5.0–8.0)

## 2020-10-25 LAB — LACTIC ACID, PLASMA
Lactic Acid, Venous: 3.7 mmol/L (ref 0.5–1.9)
Lactic Acid, Venous: 5.2 mmol/L (ref 0.5–1.9)
Lactic Acid, Venous: 4.2 mmol/L (ref 0.5–1.9)
Lactic Acid, Venous: 3.1 mmol/L (ref 0.5–1.9)

## 2020-10-25 LAB — GLUCOSE, CAPILLARY
Glucose-Capillary: 104 mg/dL — ABNORMAL HIGH (ref 70–99)
Glucose-Capillary: 106 mg/dL — ABNORMAL HIGH (ref 70–99)
Glucose-Capillary: 113 mg/dL — ABNORMAL HIGH (ref 70–99)
Glucose-Capillary: 147 mg/dL — ABNORMAL HIGH (ref 70–99)
Glucose-Capillary: 166 mg/dL — ABNORMAL HIGH (ref 70–99)
Glucose-Capillary: 183 mg/dL — ABNORMAL HIGH (ref 70–99)
Glucose-Capillary: 237 mg/dL — ABNORMAL HIGH (ref 70–99)

## 2020-10-25 LAB — BASIC METABOLIC PANEL
Anion gap: 20 — ABNORMAL HIGH (ref 5–15)
Anion gap: 11 (ref 5–15)
BUN: 22 mg/dL (ref 8–23)
BUN: 18 mg/dL (ref 8–23)
CO2: 10 mmol/L — ABNORMAL LOW (ref 22–32)
CO2: 19 mmol/L — ABNORMAL LOW (ref 22–32)
Calcium: 9.2 mg/dL (ref 8.9–10.3)
Calcium: 9.6 mg/dL (ref 8.9–10.3)
Chloride: 109 mmol/L (ref 98–111)
Chloride: 110 mmol/L (ref 98–111)
Creatinine, Ser: 1.38 mg/dL — ABNORMAL HIGH (ref 0.44–1.00)
Creatinine, Ser: 0.84 mg/dL (ref 0.44–1.00)
GFR, Estimated: 43 mL/min — ABNORMAL LOW (ref >60)
GFR, Estimated: >60 mL/min (ref >60)
Glucose, Bld: 463 mg/dL — ABNORMAL HIGH (ref 70–99)
Glucose, Bld: 167 mg/dL — ABNORMAL HIGH (ref 70–99)
Potassium: 4.1 mmol/L (ref 3.5–5.1)
Potassium: 4.1 mmol/L (ref 3.5–5.1)
Sodium: 139 mmol/L (ref 135–145)
Sodium: 140 mmol/L (ref 135–145)

## 2020-10-25 LAB — BETA-HYDROXYBUTYRIC ACID
Beta-Hydroxybutyric Acid: >8 mmol/L — ABNORMAL HIGH (ref 0.05–0.27)
Beta-Hydroxybutyric Acid: 1 mmol/L — ABNORMAL HIGH (ref 0.05–0.27)

## 2020-10-25 LAB — CBC WITH DIFFERENTIAL/PLATELET
Abs Immature Granulocytes: 0.08 10*3/uL — ABNORMAL HIGH (ref 0.00–0.07)
Basophils Absolute: 0.1 10*3/uL (ref 0.0–0.1)
Basophils Relative: 0 %
Eosinophils Absolute: 0 10*3/uL (ref 0.0–0.5)
Eosinophils Relative: 0 %
HCT: 39.9 % (ref 36.0–46.0)
Hemoglobin: 12 g/dL (ref 12.0–15.0)
Immature Granulocytes: 1 %
Lymphocytes Relative: 8 %
Lymphs Abs: 1.2 10*3/uL (ref 0.7–4.0)
MCH: 24.9 pg — ABNORMAL LOW (ref 26.0–34.0)
MCHC: 30.1 g/dL (ref 30.0–36.0)
MCV: 83 fL (ref 80.0–100.0)
Monocytes Absolute: 0.6 10*3/uL (ref 0.1–1.0)
Monocytes Relative: 4 %
Neutro Abs: 13.2 10*3/uL — ABNORMAL HIGH (ref 1.7–7.7)
Neutrophils Relative %: 87 %
Platelets: 196 10*3/uL (ref 150–400)
RBC: 4.81 MIL/uL (ref 3.87–5.11)
RDW: 14.9 % (ref 11.5–15.5)
WBC: 15.2 10*3/uL — ABNORMAL HIGH (ref 4.0–10.5)
nRBC: 0 % (ref 0.0–0.2)

## 2020-10-25 LAB — PROTIME-INR
INR: 1 (ref 0.8–1.2)
Prothrombin Time: 13 seconds (ref 11.4–15.2)

## 2020-10-25 LAB — MRSA PCR SCREENING: MRSA by PCR: NEGATIVE

## 2020-10-25 LAB — RESP PANEL BY RT-PCR (FLU A&B, COVID) ARPGX2
Influenza A by PCR: NEGATIVE
Influenza B by PCR: NEGATIVE
SARS Coronavirus 2 by RT PCR: NEGATIVE

## 2020-10-25 LAB — APTT: aPTT: 29 seconds (ref 24–36)

## 2020-10-25 MED ORDER — CHLORHEXIDINE GLUCONATE CLOTH 2 % EX PADS
6.0000 | MEDICATED_PAD | Freq: Every day | CUTANEOUS | Status: DC
  Administered 2020-10-25: 6 via TOPICAL

## 2020-10-25 MED ORDER — DEXTROSE IN LACTATED RINGERS 5 % IV SOLN: INTRAVENOUS | Status: DC

## 2020-10-25 MED ORDER — HEPARIN SODIUM (PORCINE) 5000 UNIT/ML IJ SOLN
5000.0000 [IU] | Freq: Three times a day (TID) | INTRAMUSCULAR | Status: DC
  Administered 2020-10-25 – 2020-10-27 (×5): 5000 [IU] via SUBCUTANEOUS
  Filled 2020-10-25 (×5): qty 1

## 2020-10-25 MED ORDER — SODIUM CHLORIDE 0.9 % IV BOLUS
1000.0000 mL | Freq: Once | INTRAVENOUS | Status: AC
  Administered 2020-10-25: 1000 mL via INTRAVENOUS

## 2020-10-25 MED ORDER — INSULIN REGULAR(HUMAN) IN NACL 100-0.9 UT/100ML-% IV SOLN
INTRAVENOUS | Status: DC
Start: 2020-10-25 — End: 2020-10-25
  Administered 2020-10-25: 9 [IU]/h via INTRAVENOUS
  Filled 2020-10-25 (×2): qty 100

## 2020-10-25 MED ORDER — ORAL CARE MOUTH RINSE
15.0000 mL | Freq: Two times a day (BID) | OROMUCOSAL | Status: DC
  Administered 2020-10-25 – 2020-10-27 (×4): 15 mL via OROMUCOSAL

## 2020-10-25 MED ORDER — DEXTROSE-NACL 5-0.9 % IV SOLN: INTRAVENOUS | Status: DC

## 2020-10-25 MED ORDER — SODIUM CHLORIDE 0.9 % IV SOLN: INTRAVENOUS | Status: DC

## 2020-10-25 MED ORDER — INSULIN GLARGINE 100 UNIT/ML ~~LOC~~ SOLN
15.0000 [IU] | Freq: Every day | SUBCUTANEOUS | Status: DC
  Administered 2020-10-25: 15 [IU] via SUBCUTANEOUS
  Filled 2020-10-25: qty 0.15

## 2020-10-25 MED ORDER — LORAZEPAM 2 MG/ML IJ SOLN
1.0000 mg | Freq: Once | INTRAMUSCULAR | Status: AC
  Administered 2020-10-25: 1 mg via INTRAVENOUS
  Filled 2020-10-25: qty 1

## 2020-10-25 MED ORDER — LACTATED RINGERS IV BOLUS
250.0000 mL | Freq: Once | INTRAVENOUS | Status: AC
  Administered 2020-10-25: 250 mL via INTRAVENOUS

## 2020-10-25 MED ORDER — LACTATED RINGERS IV SOLN: INTRAVENOUS | Status: DC

## 2020-10-25 MED ORDER — HYDROGEN PEROXIDE 3 % EX SOLN
CUTANEOUS | Status: AC
  Filled 2020-10-25: qty 473

## 2020-10-25 MED ORDER — INSULIN ASPART 100 UNIT/ML ~~LOC~~ SOLN: 0.0000 [IU] | SUBCUTANEOUS | Status: DC

## 2020-10-25 MED ORDER — DEXTROSE 50 % IV SOLN
0.0000 mL | INTRAVENOUS | Status: DC | PRN
  Administered 2020-10-26 – 2020-10-27 (×3): 50 mL via INTRAVENOUS
  Filled 2020-10-25 (×4): qty 50

## 2020-10-25 MED ORDER — LACTATED RINGERS IV BOLUS
1000.0000 mL | Freq: Once | INTRAVENOUS | Status: AC
  Administered 2020-10-25: 1000 mL via INTRAVENOUS

## 2020-10-25 MED ORDER — DEXTROSE 50 % IV SOLN: 0.0000 mL | INTRAVENOUS | Status: DC | PRN

## 2020-10-25 MED ORDER — INSULIN REGULAR(HUMAN) IN NACL 100-0.9 UT/100ML-% IV SOLN
INTRAVENOUS | Status: DC
  Administered 2020-10-25: 8 [IU]/h via INTRAVENOUS
  Filled 2020-10-25: qty 100

## 2020-10-25 NOTE — H&P (Signed)
History and Physical        Hospital Admission Note Date: 10/25/2020  Patient name: Donna Harrell Medical record number: [id number] Date of birth: 09-Apr-1956 Age: 65 y.o. Gender: female  PCP: Pcp, No   Chief Complaint    Chief Complaint  Patient presents with  . Hyperglycemia  . Altered Mental Status      HPI:   This is a 65 year old female with past medical history of type 1 diabetes, TIA, hypertension, recurrent DKA, blind who presented to the ED due to altered mental status and hyperglycemia.  She has been hospitalized multiple times in the past month for DKA and most recently was discharged to [facility] on 3/20.  Apparently, the patient recently moved from [place] to [place] and has been living on her own and has since been having issues with her blood sugar, initially hypoglycemia and now hyperglycemia.  Patient's sister came from [place] yesterday and brought her home from SNF and noted her blood sugars to be in the 400s when she picked patient up.  After dinner her blood sugar went to over 600 and was given insulin at that time with little change in her sugar.  Patient woke up this morning with nausea, vomiting and confusion.  Patient sisters plan to take her back to [place] at discharge.  Currently, the patient is unable to provide much history but answers 'no' to ROS questions and denies any specific complaints.   ED Course: Afebrile, tachycardic, hemodynamically stable, on room air. Notable Labs: Sodium 133, K5.3, chloride 97, CO2 13, glucose 658, BUN 23, creatinine 1.37, AG 23, T bili 1.8, lactic acid 3.7, WBC 15.2, Hb 12.0, ketones > 8.0, UA with glucosuria and ketones, VBG-pH 7.2, PCO2 24, PO2 44, bicarb 9, COVID-19 and flu negative. Notable Imaging: CXR-mildly enlarged heart but otherwise unremarkable. Patient received Ativan, IV fluids and insulin drip per  DKA protocol.  Mittens were applied for safety   Vitals:   10/25/20 1230 10/25/20 1300  BP: 124/65 117/63  Pulse: (!) 117 (!) 106  Resp: (!) 29 16  Temp:    SpO2: 100% 100%     Review of Systems:  Review of Systems  All other systems reviewed and are negative.   Medical/Social/Family History   Past Medical History: Past Medical History:  Diagnosis Date  . Diabetes (HCC)   . 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 Hypertension   . Legally blind    per sister  . TIA (transient ischemic attack)    per sister    Past Surgical History:  Procedure Laterality Date  . 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 Authorizing Provider  amLODipine (NORVASC) 5 MG tablet Take 1 tablet (5 mg total) by mouth daily. 09/30/20   Azucena Fallen, MD  atorvastatin (LIPITOR) 40 MG tablet Take 40 mg by mouth daily. 06/13/20   [provider]  clobetasol cream (TEMOVATE) 0.05 % Apply 1 application topically every other day. 09/05/20   [provider]  clopidogrel (PLAVIX) 75 MG tablet Take 75 mg by mouth daily. 06/13/20   [provider]  feeding supplement, GLUCERNA SHAKE, (GLUCERNA SHAKE) LIQD Take 237 mLs by mouth 2 (two) times daily between meals. 09/30/20   Azucena FallenLancaster, William C, MD  insulin aspart (NOVOLOG) 100 UNIT/ML injection Inject 0-5 Units into the skin at bedtime. 10/05/20   Shahmehdi, Gemma PayorSeyed A, MD  insulin aspart (NOVOLOG) 100 UNIT/ML injection Inject 0-15 Units into the skin 3 (three) times daily with meals. 10/05/20   Shahmehdi, Gemma PayorSeyed A, MD  insulin glargine (LANTUS) 100 UNIT/ML injection Inject 0.15 mLs (15 Units total) into the skin at bedtime. 09/30/20   Azucena FallenLancaster, William C, MD  lisinopril (ZESTRIL) 20 MG tablet Take 20 mg by mouth daily. 05/30/20   [provider]    Allergies:  No Known Allergies  Social History:  reports that she has quit smoking. She has never used smokeless tobacco. She reports previous alcohol use. She reports previous drug use.  Family History: Family History  Problem Relation Age of Onset  . High blood pressure Mother   . High blood pressure Sister   . Stroke Sister      Objective   Physical Exam: Blood pressure 117/63, pulse (!) 106, temperature 98.2 F (36.8 C), resp. rate 16, weight 56.1 kg, SpO2 100 %.  Physical Exam Vitals and nursing note reviewed. Exam conducted with a chaperone present.  Constitutional:      Comments: Lethargic but awakens to loud verbal stimuli  HENT:     Head: Normocephalic.     Mouth/Throat:     Mouth: Mucous membranes are moist.  Eyes:     Conjunctiva/sclera: Conjunctivae normal.  Cardiovascular:     Rate and Rhythm: Normal rate and regular rhythm.     Heart sounds: No murmur heard.   Pulmonary:     Effort: Pulmonary effort is normal.     Breath sounds: Normal breath sounds.  Abdominal:     General: Abdomen is flat. There is no distension.  Musculoskeletal:     Comments: RLE swelling Right plantar foot healing wound Left great toe healing superficial ulcer without signs of infection  Neurological:     Comments: Oriented x3     LABS on Admission: I have personally reviewed all the labs and imaging below    Basic Metabolic Panel: Recent Labs  Lab 10/25/20 0958 10/25/20 1300  NA 133* 139  K 5.3* 4.1  CL 97* 109  CO2 13* 10*  GLUCOSE 658* 463*  BUN 23 22  CREATININE 1.37* 1.38*  CALCIUM 9.6 9.2   Liver Function Tests: Recent Labs  Lab 10/25/20 0958  AST 22  ALT 20  ALKPHOS 77  BILITOT 1.8*  PROT 7.0  ALBUMIN 4.1   No results for input(s): LIPASE, AMYLASE in the last 168 hours. No results for input(s): AMMONIA in the last 168 hours. CBC: Recent Labs  Lab 10/25/20 0958  WBC 15.2*  NEUTROABS 13.2*  HGB 12.0  HCT 39.9  MCV 83.0   PLT 196   Cardiac Enzymes: No results for input(s): CKTOTAL, CKMB, CKMBINDEX, TROPONINI in the last 168 hours. BNP: Invalid input(s): POCBNP CBG: Recent Labs  Lab 10/25/20 1328 10/25/20 1400  GLUCAP 415* 380*    Radiological Exams on Admission:  CT Head Wo Contrast  Result Date: 10/25/2020 CLINICAL DATA:  Altered mental status/delirium EXAM: CT HEAD WITHOUT CONTRAST TECHNIQUE: Contiguous axial images were obtained from the base of the skull through the vertex without  intravenous contrast. COMPARISON:  Head CT September 17, 2020; brain MRI September 18, 2020 FINDINGS: Brain: Ventricles and sulci are normal in size and configuration. There is no appreciable intracranial mass, hemorrhage, extra-axial fluid collection, or midline shift. The brain parenchyma appears unremarkable. No demonstrable acute infarct. Vascular: No hyperdense vessel. There are foci of calcification in each carotid siphon region. Skull: The bony calvarium appears intact. Sinuses/Orbits: Visualized paranasal sinuses are clear. Evidence of probable hemorrhage within the right globe. Status post scleral banding. This appearance is stable compared to prior study. Left globe appears normal. Orbits otherwise appear symmetric bilaterally. Other: Mastoid air cells clear. IMPRESSION: 1.  Normal appearing brain parenchyma.  No mass or hemorrhage. 2. Presumed prior hemorrhage within the right globe with scleral banding noted. Appearance stable compared to prior study. 3.  Foci of arterial vascular calcification noted. Electronically Signed   By: Bretta Bang III M.D.   On: 10/25/2020 14:13   DG Chest Port 1 View  Result Date: 10/25/2020 CLINICAL DATA:  Concern for sepsis. Hypertension. Diabetes mellitus. EXAM: PORTABLE CHEST 1 VIEW COMPARISON:  October 03, 2020 FINDINGS: The lungs are clear. Heart is slightly enlarged with pulmonary vascularity normal. No adenopathy no bone lesions. IMPRESSION: Heart mildly enlarged.  Lungs clear.  Electronically Signed   By: Bretta Bang III M.D.   On: 10/25/2020 10:46      EKG: sinus tachycardia   A & P   Active Problems:   DKA (diabetic ketoacidosis) (HCC)   Essential hypertension   Acute metabolic encephalopathy   Altered mental status   Type 1 diabetes mellitus with other specified complication (HCC)   Right leg swelling   AKI (acute kidney injury) (HCC)   Goals of care, counseling/discussion   1. DKA in type I diabetic a. Unclear cause at this time b. Third episode of DKA in the past month c. Change LR to NS due to lactic acidosis d. Continue IV fluids and insulin drip per DKA protocol  2. Acute metabolic encephalopathy secondary to DKA, improving a. On presentation apparently was disoriented and pulling at lines but currently oriented x3, though still very lethargic b. Continue treatment as above  3. Lactic acidosis a. Possibly secondary to lactated Ringer's versus infection b. 3.7-> 5.2 c. CXR and UA unremarkable and without any overt sign of infection on exam d. Change LR to NS and repeat lactic acid e. Hold antibiotics for now  4. RLE swelling a. RLE [place] to rule out DVT  5. AKI secondary to DKA a. Creatinine 1.37, baseline 0.6 b. Continue IV fluids  6. Goals of care discussion a. DNR and MOST form signed at last discharge however paperwork from [facility] shows patient wished to be full code.  Unfortunately, she is unable to make a sound decision at this time b. Discussed GOC with patient's sisters over the phone who advised DNR for now and will discuss amongst themselves  7. Leukocytosis a. Suspect reactive to DKA vs possible DVT as she is afebrile and without any obvious infectious source b. Continue to monitor for now  8. Hypertension a. Holding home meds for now as she is n.p.o. and has soft BPs   DVT prophylaxis: Heparin   Code Status: DNR  Diet: N.p.o. Family Communication: Admission, patients condition and plan of care including  tests being ordered have been discussed with the patient who indicates understanding and agrees with the plan and Code Status. Patient's sisters were updated  Disposition Plan: The appropriate patient status for this  patient is INPATIENT. Inpatient status is judged to be reasonable and necessary in order to provide the required intensity of service to ensure the patient's safety. The patient's presenting symptoms, physical exam findings, and initial radiographic and laboratory data in the context of their chronic comorbidities is felt to place them at high risk for further clinical deterioration. Furthermore, it is not anticipated that the patient will be medically stable for discharge from the hospital within 2 midnights of admission. The following factors support the patient status of inpatient.   " The patient's presenting symptoms include altered mental status. " The worrisome physical exam findings include altered mental status, right lower extremity swelling. " The initial radiographic and laboratory data are worrisome because of DKA. " The chronic co-morbidities include type 1 diabetes.   * I certify that at the point of admission it is my clinical judgment that the patient will require inpatient hospital care spanning beyond 2 midnights from the point of admission due to high intensity of service, high risk for further deterioration and high frequency of surveillance required.*   Status is: Inpatient  Remains inpatient appropriate because:Altered mental status, IV treatments appropriate due to intensity of illness or inability to take PO and Inpatient level of care appropriate due to severity of illness   Dispo: The patient is from: Home              Anticipated d/c is to: Home              Patient currently is not medically stable to d/c.   Difficult to place patient No         The medical decision making on this patient was of high complexity and the patient is at high risk for  clinical deterioration, therefore this is a level 3  admission.  Consultants  . None  Procedures  . None  Time Spent on Admission: 73 minutes    Jae Dire, DO [facility]  10/25/2020, 2:38 PM

## 2020-10-25 NOTE — ED Triage Notes (Signed)
Pt came from home via EMS. PMH of DM. CBG: >600. A&O x2. AMS. Refused IV access. HR: 120 O2: 96% on RA

## 2020-10-25 NOTE — Progress Notes (Signed)
Meets criteria to transition off insuline drip:[facility] night coverage informed

## 2020-10-25 NOTE — ED Notes (Signed)
Attempted to start second line for pt d/t possible DKA. After sticking the pt, pt yelled at this RN stating "What are you doing?" Pt was advised that she needs 2 lines for her complaint. Pt yelled, "NO, NO!". IV was removed and gauze placed. Pt advised that if she requires a second IV, IV team will come to place the line. RN, Tobi Bastos aware and at bedside.

## 2020-10-25 NOTE — ED Notes (Signed)
Called report to Jill Side, RN on 2W

## 2020-10-25 NOTE — Progress Notes (Signed)
Date and time results received: 10/25/20 1937  Test: Lactic acid  Critical Value: 3.1, improved from 4.2   Name of Provider Notified: NP: Linton Flemings      Actions Taken:No intervention needed

## 2020-10-25 NOTE — ED Notes (Signed)
Mittens applied for pt safety to prevent removal of necessary medical equipment

## 2020-10-25 NOTE — ED Notes (Signed)
Logan, PA notified of low BP. Rubin Payor, MD bedside

## 2020-10-25 NOTE — ED Provider Notes (Signed)
[facility] Note   CSN: [id number] Arrival date & time: 10/25/20  8786     History Chief Complaint  Patient presents with  . Hyperglycemia  . Altered Mental Status    Donna Harrell is a 65 y.o. female.  HPI Patient is a 65 year old female with history of type 1 diabetes mellitus, TIA, hypertension, DKA, who presents the emergency department due to altered mental status as well as hyperglycemia.  Per EMS notes, patient had a CBG in the field of greater than 600.  She is A&O x2.  She refused IV access in transit.  Patient states her age, birthdate, and current month.  Otherwise, she cannot tell me the year, president, or where she is currently located.  She believes she is currently in [place].  I spoke to the patient's Sister Elease Hashimoto.  She states that patient recently moved out to [place] from [place].  She has been living on her own.  They try to get her to stay in [place] because she was going to likely need assistance with her care but she was "determined" to move to [place].  After coming here she began having issues with her blood sugar.  She states that she was initially having issues with hypoglycemia.  Recently this changed and she started having issues with hyperglycemia.  She was recently admitted for DKA and has been residing in [facility].  Her sister came down from [place] yesterday and picked her up from [facility] and brought her home.  She states that her sugars were in the 400s when they picked her up.  They got dinner later that day and her sugar went up to over 600.  She was given insulin at that time with little change in her sugar levels.  She woke up this morning with nausea, vomiting, as well as increased confusion.  Her sister does note that she has been more confused at baseline since she started having glycemic issues and this has been an ongoing issue.  Level 5 caveat due to altered mental  status.    Past Medical History:  Diagnosis Date  . Diabetes (HCC)   . 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 Hypertension   . Legally blind    per sister  . TIA (transient ischemic attack)    per sister    Patient Active Problem List   Diagnosis Date Noted  . Type 1 diabetes mellitus with other specified complication (HCC) 10/03/2020  . Depression 10/03/2020  . Altered mental status 09/24/2020  . Essential hypertension 09/18/2020  . Acute metabolic encephalopathy 09/18/2020  . Elevated troponin 09/18/2020  . Elevated CK 09/18/2020  . Elevated lactic acid level 09/18/2020  . Encephalopathy 09/18/2020  . DKA (diabetic ketoacidosis) (HCC) 09/17/2020    Past Surgical History:  Procedure Laterality Date  . 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     Family History  Problem Relation Age of Onset  . High blood pressure Mother   . High blood pressure Sister   . Stroke Sister     Social History   Tobacco Use  . Smoking status: Former Games developer  . Smokeless tobacco: Never Used  . Tobacco comment: quit 20 years ago  Substance Use Topics  . Alcohol use: Not  Currently  . Drug use: Not Currently    Comment: used to use marijuana as a teenager    Home Medications Prior to Admission medications   Medication Sig Start Date End Date Taking? Authorizing Provider  amLODipine (NORVASC) 5 MG tablet Take 1 tablet (5 mg total) by mouth daily. 09/30/20   Azucena FallenLancaster, William C, MD  atorvastatin (LIPITOR) 40 MG tablet Take 40 mg by mouth daily. 06/13/20   [provider]  clobetasol cream (TEMOVATE) 0.05 % Apply 1 application topically every other day. 09/05/20   [provider]  clopidogrel (PLAVIX) 75 MG tablet Take 75 mg by mouth daily. 06/13/20   [provider]  feeding supplement, GLUCERNA SHAKE, (GLUCERNA  SHAKE) LIQD Take 237 mLs by mouth 2 (two) times daily between meals. 09/30/20   Azucena FallenLancaster, William C, MD  insulin aspart (NOVOLOG) 100 UNIT/ML injection Inject 0-5 Units into the skin at bedtime. 10/05/20   Shahmehdi, Gemma PayorSeyed A, MD  insulin aspart (NOVOLOG) 100 UNIT/ML injection Inject 0-15 Units into the skin 3 (three) times daily with meals. 10/05/20   Shahmehdi, Gemma PayorSeyed A, MD  insulin glargine (LANTUS) 100 UNIT/ML injection Inject 0.15 mLs (15 Units total) into the skin at bedtime. 09/30/20   Azucena FallenLancaster, William C, MD  lisinopril (ZESTRIL) 20 MG tablet Take 20 mg by mouth daily. 05/30/20   [provider]    Allergies    Patient has no known allergies.  Review of Systems   Review of Systems  All other systems reviewed and are negative. Ten systems reviewed and are negative for acute change, except as noted in the HPI.   Physical Exam Updated Vital Signs BP 124/65   Pulse (!) 117   Temp 98.2 F (36.8 C)   Resp (!) 29   SpO2 100%   Physical Exam Vitals and nursing note reviewed.  Constitutional:      General: She is not in acute distress.    Appearance: She is not ill-appearing, toxic-appearing or diaphoretic.     Comments: Well-developed cachectic adult female.  HENT:     Head: Normocephalic and atraumatic.     Right Ear: External ear normal.     Left Ear: External ear normal.     Nose: Nose normal.     Mouth/Throat:     Mouth: Mucous membranes are moist.     Pharynx: Oropharynx is clear. No oropharyngeal exudate or posterior oropharyngeal erythema.  Eyes:     General: No scleral icterus.       Right eye: No discharge.        Left eye: No discharge.     Extraocular Movements: Extraocular movements intact.     Conjunctiva/sclera: Conjunctivae normal.  Cardiovascular:     Rate and Rhythm: Regular rhythm. Tachycardia present.     Pulses: Normal pulses.     Heart sounds: Murmur heard.  No friction rub. No gallop.   Pulmonary:     Effort: Pulmonary effort is normal. No  respiratory distress.     Breath sounds: Normal breath sounds. No stridor. No wheezing, rhonchi or rales.  Abdominal:     General: Abdomen is flat.     Palpations: Abdomen is soft.     Tenderness: There is no abdominal tenderness.     Comments: Abdomen is flat, soft, and nontender.  Musculoskeletal:        General: Normal range of motion.     Cervical back: Normal range of motion and neck supple. No tenderness.     Right  lower leg: No edema.     Left lower leg: No edema.  Skin:    General: Skin is warm and dry.  Neurological:     Comments: Oriented to self and month.  Unsure of the president, location, or year.  Moving all 4 extremities when asked.  Following commands.  Psychiatric:        Behavior: Behavior is slowed.    ED Results / Procedures / Treatments   Labs (all labs ordered are listed, but only abnormal results are displayed) Labs Reviewed  LACTIC ACID, PLASMA - Abnormal; Notable for the following components:      Result Value   Lactic Acid, Venous 3.7 (*)    All other components within normal limits  COMPREHENSIVE METABOLIC PANEL - Abnormal; Notable for the following components:   Sodium 133 (*)    Potassium 5.3 (*)    Chloride 97 (*)    CO2 13 (*)    Glucose, Bld 658 (*)    Creatinine, Ser 1.37 (*)    Total Bilirubin 1.8 (*)    GFR, Estimated 44 (*)    Anion gap 23 (*)    All other components within normal limits  CBC WITH DIFFERENTIAL/PLATELET - Abnormal; Notable for the following components:   WBC 15.2 (*)    MCH 24.9 (*)    Neutro Abs 13.2 (*)    Abs Immature Granulocytes 0.08 (*)    All other components within normal limits  BLOOD GAS, VENOUS - Abnormal; Notable for the following components:   pH, Ven 7.231 (*)    pCO2, Ven 23.8 (*)    Bicarbonate 9.6 (*)    Acid-base deficit 16.4 (*)    All other components within normal limits  BETA-HYDROXYBUTYRIC ACID - Abnormal; Notable for the following components:   Beta-Hydroxybutyric Acid >8.00 (*)    All  other components within normal limits  CBG MONITORING, ED - Abnormal; Notable for the following components:   Glucose-Capillary 586 (*)    All other components within normal limits  CBG MONITORING, ED - Abnormal; Notable for the following components:   Glucose-Capillary >600 (*)    All other components within normal limits  RESP PANEL BY RT-PCR (FLU A&B, COVID) ARPGX2  CULTURE, BLOOD (SINGLE)  URINE CULTURE  PROTIME-INR  APTT  LACTIC ACID, PLASMA  URINALYSIS, ROUTINE W REFLEX MICROSCOPIC   EKG None  Radiology DG Chest Port 1 View  Result Date: 10/25/2020 CLINICAL DATA:  Concern for sepsis. Hypertension. Diabetes mellitus. EXAM: PORTABLE CHEST 1 VIEW COMPARISON:  October 03, 2020 FINDINGS: The lungs are clear. Heart is slightly enlarged with pulmonary vascularity normal. No adenopathy no bone lesions. IMPRESSION: Heart mildly enlarged.  Lungs clear. Electronically Signed   By: Bretta Bang III M.D.   On: 10/25/2020 10:46    Procedures .Critical Care Performed by: Placido Sou, PA-C Authorized by: Placido Sou, PA-C   Critical care provider statement:    Critical care time (minutes):  45   Critical care was necessary to treat or prevent imminent or life-threatening deterioration of the following conditions: DKA.   Critical care was time spent personally by me on the following activities:  Discussions with consultants, evaluation of patient's response to treatment, examination of patient, ordering and performing treatments and interventions, ordering and review of laboratory studies, ordering and review of radiographic studies, pulse oximetry, re-evaluation of patient's condition, obtaining history from patient or surrogate and review of old charts    Medications Ordered in ED Medications  insulin regular, human (  MYXREDLIN) 100 units/ 100 mL infusion (9 Units/hr Intravenous New Bag/Given 10/25/20 1140)  lactated ringers infusion (has no administration in time range)   dextrose 5 % in lactated ringers infusion (0 mLs Intravenous Hold 10/25/20 1114)  dextrose 50 % solution 0-50 mL (has no administration in time range)  lactated ringers bolus 1,000 mL (0 mLs Intravenous Stopped 10/25/20 1150)  hydrogen peroxide 3 % external solution (  Given 10/25/20 1050)  LORazepam (ATIVAN) injection 1 mg (1 mg Intravenous Given 10/25/20 1127)  lactated ringers bolus 250 mL (250 mLs Intravenous New Bag/Given 10/25/20 1210)  sodium chloride 0.9 % bolus 1,000 mL (1,000 mLs Intravenous New Bag/Given 10/25/20 1201)    ED Course  I have reviewed the triage vital signs and the nursing notes.  Pertinent labs & imaging results that were available during my care of the patient were reviewed by me and considered in my medical decision making (see chart for details).  Clinical Course as of 10/25/20 1253  Sat Oct 25, 2020  1108 Beta-Hydroxybutyric Acid(!): >8.00 [LJ]  1130 Lactic Acid, Venous(!!): 3.7 [LJ]  1130 Potassium(!): 5.3 [LJ]  1130 Glucose(!!): 658 [LJ]  1130 pH, Ven(!): 7.231 [LJ]    Clinical Course User Index [LJ] Placido Sou, PA-C   MDM Rules/Calculators/A&P                          Pt is a 65 y.o. female with a history of type 1 diabetes mellitus who presents the emergency department in DKA.  Labs: CBC with a white blood cell count of 15.2, MCH of 24.9, neutrophils of 13.2. CMP was pseudohyponatremia of 133, potassium of 5.3, glucose of 658, creatinine of 1.37, GFR 44.  Anion gap of 23. Beta hydroxybutyric acid greater than 8. VBG with a pH of 7.231. Lactic acid of 3.7. Respiratory panel is negative.  Imaging: Chest x-ray is negative. CT scan of the head is pending.  I, Placido Sou, PA-C, personally reviewed and evaluated these images and lab results as part of my medical decision-making.  Patient presents today with what appears to be a recurrent episode of DKA.  She has had 2 previous admissions earlier this year due to DKA.  She was initially living  in [place] and chose to move to [place] where she does not currently have any family.  She was living independently.  After her most recent admission she was discharged to [facility] and was discharged from [facility] yesterday.  Her sister picked her up and noted that her CBG was high when she picked her up.  This morning it rose greater than 600 and she started experiencing nausea, vomiting, increased confusion.  This all appears to be similar to her prior DKA admission.  Patient fluid bolused and started on IV insulin.  Will discuss with the medicine team for admission.  Note: Portions of this report may have been transcribed using voice recognition software. Every effort was made to ensure accuracy; however, inadvertent computerized transcription errors may be present.   Final Clinical Impression(s) / ED Diagnoses Final diagnoses:  Diabetic ketoacidosis without coma associated with type 1 diabetes mellitus [facility])    Rx / DC Orders ED Discharge Orders    None       Placido Sou, PA-C 10/25/20 1253    Benjiman Core, MD 10/26/20 [phone]

## 2020-10-26 ENCOUNTER — Inpatient Hospital Stay (HOSPITAL_COMMUNITY): Payer: Medicare Other

## 2020-10-26 DIAGNOSIS — E1010 Type 1 diabetes mellitus with ketoacidosis without coma: Secondary | ICD-10-CM | POA: Diagnosis not present

## 2020-10-26 DIAGNOSIS — M7989 Other specified soft tissue disorders: Secondary | ICD-10-CM

## 2020-10-26 LAB — BASIC METABOLIC PANEL
Anion gap: 7 (ref 5–15)
BUN: 13 mg/dL (ref 8–23)
CO2: 23 mmol/L (ref 22–32)
Calcium: 9.3 mg/dL (ref 8.9–10.3)
Chloride: 113 mmol/L — ABNORMAL HIGH (ref 98–111)
Creatinine, Ser: 0.66 mg/dL (ref 0.44–1.00)
GFR, Estimated: >60 mL/min (ref >60)
Glucose, Bld: 128 mg/dL — ABNORMAL HIGH (ref 70–99)
Potassium: 3.8 mmol/L (ref 3.5–5.1)
Sodium: 143 mmol/L (ref 135–145)

## 2020-10-26 LAB — CBC
HCT: 36.6 % (ref 36.0–46.0)
Hemoglobin: 11.9 g/dL — ABNORMAL LOW (ref 12.0–15.0)
MCH: 25.3 pg — ABNORMAL LOW (ref 26.0–34.0)
MCHC: 32.5 g/dL (ref 30.0–36.0)
MCV: 77.7 fL — ABNORMAL LOW (ref 80.0–100.0)
Platelets: 179 10*3/uL (ref 150–400)
RBC: 4.71 MIL/uL (ref 3.87–5.11)
RDW: 14.8 % (ref 11.5–15.5)
WBC: 23.4 10*3/uL — ABNORMAL HIGH (ref 4.0–10.5)
nRBC: 0 % (ref 0.0–0.2)

## 2020-10-26 LAB — URINE CULTURE: Culture: NO GROWTH

## 2020-10-26 LAB — GLUCOSE, CAPILLARY
Glucose-Capillary: 69 mg/dL — ABNORMAL LOW (ref 70–99)
Glucose-Capillary: 131 mg/dL — ABNORMAL HIGH (ref 70–99)
Glucose-Capillary: 117 mg/dL — ABNORMAL HIGH (ref 70–99)
Glucose-Capillary: 55 mg/dL — ABNORMAL LOW (ref 70–99)
Glucose-Capillary: 73 mg/dL (ref 70–99)
Glucose-Capillary: 47 mg/dL — ABNORMAL LOW (ref 70–99)
Glucose-Capillary: 177 mg/dL — ABNORMAL HIGH (ref 70–99)
Glucose-Capillary: 51 mg/dL — ABNORMAL LOW (ref 70–99)
Glucose-Capillary: 335 mg/dL — ABNORMAL HIGH (ref 70–99)

## 2020-10-26 LAB — LACTIC ACID, PLASMA: Lactic Acid, Venous: 1.5 mmol/L (ref 0.5–1.9)

## 2020-10-26 LAB — BETA-HYDROXYBUTYRIC ACID
Beta-Hydroxybutyric Acid: 1.92 mmol/L — ABNORMAL HIGH (ref 0.05–0.27)
Beta-Hydroxybutyric Acid: 0.18 mmol/L (ref 0.05–0.27)

## 2020-10-26 MED ORDER — INSULIN ASPART 100 UNIT/ML ~~LOC~~ SOLN: 0.0000 [IU] | Freq: Three times a day (TID) | SUBCUTANEOUS | Status: DC

## 2020-10-26 MED ORDER — SODIUM CHLORIDE 0.9 % IV SOLN: INTRAVENOUS | Status: DC

## 2020-10-26 MED ORDER — ATORVASTATIN CALCIUM 40 MG PO TABS: 40.0000 mg | ORAL_TABLET | Freq: Every day | ORAL | Status: DC

## 2020-10-26 MED ORDER — AMLODIPINE BESYLATE 5 MG PO TABS
5.0000 mg | ORAL_TABLET | Freq: Every day | ORAL | Status: DC
  Administered 2020-10-26 – 2020-10-27 (×2): 5 mg via ORAL
  Filled 2020-10-26 (×2): qty 1

## 2020-10-26 MED ORDER — INSULIN GLARGINE 100 UNIT/ML ~~LOC~~ SOLN
10.0000 [IU] | Freq: Every day | SUBCUTANEOUS | Status: DC
  Administered 2020-10-26: 10 [IU] via SUBCUTANEOUS
  Filled 2020-10-26: qty 0.1

## 2020-10-26 MED ORDER — AMLODIPINE BESYLATE 5 MG PO TABS: 5.0000 mg | ORAL_TABLET | Freq: Every day | ORAL | Status: DC

## 2020-10-26 MED ORDER — ATORVASTATIN CALCIUM 40 MG PO TABS
40.0000 mg | ORAL_TABLET | Freq: Every day | ORAL | Status: DC
  Administered 2020-10-26: 40 mg via ORAL
  Filled 2020-10-26 (×2): qty 1

## 2020-10-26 MED ORDER — INSULIN ASPART 100 UNIT/ML ~~LOC~~ SOLN
0.0000 [IU] | Freq: Every day | SUBCUTANEOUS | Status: DC
  Administered 2020-10-26: 4 [IU] via SUBCUTANEOUS

## 2020-10-26 MED ORDER — CLOPIDOGREL BISULFATE 75 MG PO TABS
75.0000 mg | ORAL_TABLET | Freq: Every day | ORAL | Status: DC
  Administered 2020-10-26 – 2020-10-27 (×2): 75 mg via ORAL
  Filled 2020-10-26 (×2): qty 1

## 2020-10-26 MED ORDER — CLOPIDOGREL BISULFATE 75 MG PO TABS: 75.0000 mg | ORAL_TABLET | Freq: Every day | ORAL | Status: DC

## 2020-10-26 NOTE — CV Procedure (Signed)
RLE venous duplex completed. Dr. Pola Corn given preliminary results at 0900.  Results can be found under chart review under CV PROC. 10/26/2020 9:19 AM Zahria Ding RVT, RDMS

## 2020-10-26 NOTE — Progress Notes (Signed)
Hypoglycemic Event  CBG: 47   Treatment: 1 amp D50 & pt drank orange juice  Symptoms: asymptomatic  Follow-up CBG: Time: 1728 CBG Result: 177  Possible Reasons for Event: lack of appetite  Comments/MD notified: Dahal, MD    Vivi Martens

## 2020-10-26 NOTE — Progress Notes (Signed)
Inpatient Diabetes Program Recommendations  AACE/ADA: New Consensus Statement on Inpatient Glycemic Control (2015)  Target Ranges:  Prepandial:   less than 140 mg/dL      Peak postprandial:   less than 180 mg/dL (1-2 hours)      Critically ill patients:  140 - 180 mg/dL   Lab Results  Component Value Date   GLUCAP 117 (H) 10/26/2020   HGBA1C 7.1 (H) 09/19/2020    Review of Glycemic Control Results for Donna Harrell, Donna Harrell (MRN [id number]) as of 10/26/2020 08:20  Ref. Range 10/25/2020 23:57 10/26/2020 03:52 10/26/2020 04:45 10/26/2020 07:52 10/26/2020 08:10  Glucose-Capillary Latest Ref Range: 70 - 99 mg/dL 657 (H) 69 (L) 846 (H) 55 (L) 117 (H)   Diabetes history: DM1 Outpatient Diabetes medications: Lantus 15 units + Novolog 0-15 units tid Current orders for Inpatient glycemic control: Lantus 15 units + 0-15 units q 4 hrs.  Inpatient Diabetes Program Recommendations:   Due to hypoglycemia: Please consider -Decrease Lantus to 10 units daily -Decrease Novolog 0-9  Tid + hs 0-5 units Secure chat sent to Dr. Pola Corn.  Thank you, Billy Fischer. Israel Wunder, RN, MSN, CDE  Diabetes Coordinator Inpatient Glycemic Control Team Team Pager [phone] (8am-5pm) 10/26/2020 8:30 AM

## 2020-10-26 NOTE — Progress Notes (Incomplete)
Hypoglycemic Event  CBG: 69  Treatment: D50 25 mL (12.5 gm)  Symptoms: None  Follow-up CBG: Time:*** CBG Result:***  Possible Reasons for Event: {Possible Reasons for Event:[id number]}  Comments/MD notified:***    Mavis O Artis

## 2020-10-26 NOTE — Progress Notes (Signed)
PROGRESS NOTE  Donna Harrell  DOB: Feb 19, 1956  PCP: Aviva Kluver [id number]  DOA: 10/25/2020  LOS: 1 day   Chief Complaint  Patient presents with  . Hyperglycemia  . Altered Mental Status   Brief narrative: Donna Harrell is a 65 y.o. female with PMH significant for diabetes mellitus, recurrent DKA, HTN, blindness.   Patient presented to the ED on 4/9 with altered mental status and hypoglycemia.   Patient has had multiple hospitalization in the past for DKA, most recently discharged to SNF on 3/20.   In the ED, patient was afebrile, tachycardic to 110s, blood pressure low 90s. Labs with blood glucose elevated to 463, lactic acid elevated to 5.2, serum bicarb low at 10, creatinine elevated 1.38, urine ketone elevated, beta hydroxybutyrate level elevated. ABG with pH 7.2 Patient was started on insulin drip and IV fluids per DKA protocol Admitted to hospitalist service  Subjective: Patient was seen and examined this morning.  Middle-aged African-American female.  Alert, awake, confused.  But she is able to tell me that she was not using Lantus after discharge from SNF. Chart reviewed No fever, tachycardia and blood pressure improving.  In fact blood pressure is elevated to 170s this morning. Labs this morning with blood sugar level low at 55, WBC count elevated to 23.4, last lactic acid level was down at 3.1  Assessment/Plan: DKA -Presented with altered mental status, hyperglycemia, elevated blood glucose, low pH, low serum bicarb, elevated ketone level -Started on management per DKA protocol with IV insulin drip, IV fluid. -Transitioned from insulin drip to subcutaneous insulin last night. -I would maintain her on normal saline at 75 mill per hour.  Type 1 diabetes mellitus -Last A1c 7.1 on 09/19/2020 -Home meds include Lantus 15 units at bedtime, NovoLog 3 times daily Premeal. -Off insulin drip last night.  Given 15 units of Lantus last night.  Blood sugar level was low at 55  this morning.  I will reduce Lantus dose to 10 units for tonight.  Continue sliding scale insulin with Accu-Cheks. Recent Labs  Lab 10/25/20 2357 10/26/20 0352 10/26/20 0445 10/26/20 0752 10/26/20 0810  GLUCAP 104* 69* 131* 55* 117*   Acute metabolic encephalopathy  -Patient had altered mental status at presentation, secondary to DKA -Alert and awake but remains confused this morning -Continue to monitor mental status  AKI Acute metabolic acidosis Lactic acidosis -Presented with creatinine elevated 1.37 compared to 0.6 from 3 weeks ago.  Serum bicarb level was low at 10.  On top of with hydroxybutyrate level, patient also had lactic acid level elevated probably due to dehydration from polyuria. -All labs improving.  Continue to monitor Recent Labs    10/03/20 0816 10/03/20 1104 10/03/20 1526 10/03/20 1955 10/04/20 0140 10/05/20 0758 10/25/20 0958 10/25/20 1300 10/25/20 1835 10/26/20 0908  BUN 23 21 17 15 13  <5* 23 22 18 13   CREATININE 1.84* 1.51* 1.00 0.90 0.75 0.60 1.37* 1.38* 0.84 0.66  CO2 9* 12* 18* 20* 22 23 13* 10* 19* 23   Recent Labs  Lab 10/25/20 0958 10/25/20 1300 10/25/20 1451 10/25/20 1835 10/26/20 0908  LATICACIDVEN 3.7* 5.2* 4.2* 3.1* 1.5   RLE swelling -RLE 12/25/20 to rule out DVT.  Pending report  Leukocytosis -Likely due to DKA itself.  No evidence of infection.  WBC count more elevated this morning.  Continue to monitor for other signs of infection. Currently not on antibiotics. Recent Labs  Lab 10/25/20 0958 10/26/20 0116  WBC 15.2* 23.4*   Hypertension -Blood pressure was  low at presentation.  Improving now. -Home meds include amlodipine 5 mg daily, lisinopril 20 mg daily. -Resume amlodipine.  Keep lisinopril on hold because of AKI -??Patient is also on Plavix and statin  Living condition -apparently, patient recently moved from [place] to [place] and has been living on her own and has since been having issues with her blood  sugar, initially hypoglycemia and now hyperglycemia.  Patient's sister came from [place] on 4/8 and brought her home from SNF. Patient's sister plans to take her back to [place] at discharge.  Mobility: Encourage ambulation.  Pending PT eval Code Status:   Code Status: DNR  Nutritional status: Body mass index is 21.04 kg/m.     Diet Order            Diet heart healthy/carb modified Room service appropriate? Yes; Fluid consistency: Thin  Diet effective now                 DVT prophylaxis: heparin injection 5,000 Units Start: 10/25/20 2200   Antimicrobials:  None Fluid: Normal saline at 75 mill per hour Consultants: None Family Communication: called and updated patient's sister Donna Harrell.   Status is: Inpatient  Remains inpatient appropriate because: Taking management  Dispo: The patient is from: Home              Anticipated d/c is to: Pending PT eval              Patient currently is not medically stable to d/c.   Difficult to place patient No       Infusions:  . sodium chloride      Scheduled Meds: . amLODipine  5 mg Oral Daily  . atorvastatin  40 mg Oral Daily  . Chlorhexidine Gluconate Cloth  6 each Topical Daily  . clopidogrel  75 mg Oral Daily  . heparin  5,000 Units Subcutaneous Q8H  . insulin aspart  0-5 Units Subcutaneous QHS  . insulin aspart  0-9 Units Subcutaneous TID WC  . insulin glargine  10 Units Subcutaneous QHS  . mouth rinse  15 mL Mouth Rinse BID    Antimicrobials: Anti-infectives (From admission, onward)   None      PRN meds: dextrose   Objective: Vitals:   10/26/20 0900 10/26/20 1000  BP: (!) 169/71 (!) 122/58  Pulse: 88 74  Resp: 15 14  Temp:    SpO2: 100% 100%    Intake/Output Summary (Last 24 hours) at 10/26/2020 1143 Last data filed at 10/26/2020 1000 Gross per 24 hour  Intake 900.6 ml  Output 2625 ml  Net -1724.4 ml   Filed Weights   10/25/20 1355 10/25/20 1604 10/26/20 0600  Weight: 56.1 kg 55.7 kg 55.6 kg    Weight change:  Body mass index is 21.04 kg/m.   Physical Exam: General exam: Middle-aged African-American female.  Not in physical distress Skin: No rashes, lesions or ulcers. HEENT: Atraumatic, normocephalic, no obvious bleeding Lungs: Clear to auscultation bilaterally CVS: Regular rate and rhythm, no murmur GI/Abd soft, nontender, nondistended, bowel sound present CNS: Alert, awake, remains confused Psychiatry: Depressed look Extremities: No pedal edema, no calf tenderness  Data Review: I have personally reviewed the laboratory data and studies available.  Recent Labs  Lab 10/25/20 0958 10/26/20 0116  WBC 15.2* 23.4*  NEUTROABS 13.2*  --   HGB 12.0 11.9*  HCT 39.9 36.6  MCV 83.0 77.7*  PLT 196 179   Recent Labs  Lab 10/25/20 0958 10/25/20 1300 10/25/20 1835 10/26/20  0908  NA 133* 139 140 143  K 5.3* 4.1 4.1 3.8  CL 97* 109 110 113*  CO2 13* 10* 19* 23  GLUCOSE 658* 463* 167* 128*  BUN 23 22 18 13   CREATININE 1.37* 1.38* 0.84 0.66  CALCIUM 9.6 9.2 9.6 9.3  MG  --   --  1.8  --     F/u labs ordered Unresulted Labs (From admission, onward)          Start     Ordered   10/27/20 0500  CBC with Differential/Platelet  Daily,   R     Question:  Specimen collection method  Answer:  Lab=Lab collect   10/26/20 1143   10/27/20 0500  Basic metabolic panel  Daily,   R     Question:  Specimen collection method  Answer:  Lab=Lab collect   10/26/20 1143   10/27/20 0500  Phosphorus  Tomorrow morning,   R       Question:  Specimen collection method  Answer:  Lab=Lab collect   10/26/20 1143   10/27/20 0500  Magnesium  Tomorrow morning,   STAT       Question:  Specimen collection method  Answer:  Lab=Lab collect   10/26/20 1143   10/25/20 0940  Urine culture  (Undifferentiated presentation (screening labs and basic nursing orders))  ONCE - STAT,   STAT        10/25/20 12/25/20          Signed, [id number], MD [facility] 10/26/2020

## 2020-10-27 ENCOUNTER — Encounter (HOSPITAL_COMMUNITY): Payer: Self-pay | Admitting: Internal Medicine

## 2020-10-27 ENCOUNTER — Other Ambulatory Visit (HOSPITAL_COMMUNITY): Payer: Self-pay

## 2020-10-27 DIAGNOSIS — E1010 Type 1 diabetes mellitus with ketoacidosis without coma: Secondary | ICD-10-CM | POA: Diagnosis not present

## 2020-10-27 LAB — CBC WITH DIFFERENTIAL/PLATELET
Abs Immature Granulocytes: 0.05 10*3/uL (ref 0.00–0.07)
Basophils Absolute: 0 10*3/uL (ref 0.0–0.1)
Basophils Relative: 1 %
Eosinophils Absolute: 0.2 10*3/uL (ref 0.0–0.5)
Eosinophils Relative: 2 %
HCT: 35.1 % — ABNORMAL LOW (ref 36.0–46.0)
Hemoglobin: 11.5 g/dL — ABNORMAL LOW (ref 12.0–15.0)
Immature Granulocytes: 1 %
Lymphocytes Relative: 30 %
Lymphs Abs: 2.5 10*3/uL (ref 0.7–4.0)
MCH: 25.2 pg — ABNORMAL LOW (ref 26.0–34.0)
MCHC: 32.8 g/dL (ref 30.0–36.0)
MCV: 77 fL — ABNORMAL LOW (ref 80.0–100.0)
Monocytes Absolute: 0.5 10*3/uL (ref 0.1–1.0)
Monocytes Relative: 6 %
Neutro Abs: 5.1 10*3/uL (ref 1.7–7.7)
Neutrophils Relative %: 60 %
Platelets: 147 10*3/uL — ABNORMAL LOW (ref 150–400)
RBC: 4.56 MIL/uL (ref 3.87–5.11)
RDW: 14.9 % (ref 11.5–15.5)
WBC: 8.3 10*3/uL (ref 4.0–10.5)
nRBC: 0 % (ref 0.0–0.2)

## 2020-10-27 LAB — BASIC METABOLIC PANEL
Anion gap: 8 (ref 5–15)
BUN: 6 mg/dL — ABNORMAL LOW (ref 8–23)
CO2: 21 mmol/L — ABNORMAL LOW (ref 22–32)
Calcium: 8.8 mg/dL — ABNORMAL LOW (ref 8.9–10.3)
Chloride: 111 mmol/L (ref 98–111)
Creatinine, Ser: 0.62 mg/dL (ref 0.44–1.00)
GFR, Estimated: >60 mL/min (ref >60)
Glucose, Bld: 56 mg/dL — ABNORMAL LOW (ref 70–99)
Potassium: 3.6 mmol/L (ref 3.5–5.1)
Sodium: 140 mmol/L (ref 135–145)

## 2020-10-27 LAB — GLUCOSE, CAPILLARY
Glucose-Capillary: 49 mg/dL — ABNORMAL LOW (ref 70–99)
Glucose-Capillary: 56 mg/dL — ABNORMAL LOW (ref 70–99)
Glucose-Capillary: 220 mg/dL — ABNORMAL HIGH (ref 70–99)
Glucose-Capillary: 239 mg/dL — ABNORMAL HIGH (ref 70–99)

## 2020-10-27 LAB — PHOSPHORUS: Phosphorus: 3.4 mg/dL (ref 2.5–4.6)

## 2020-10-27 LAB — MAGNESIUM: Magnesium: 1.6 mg/dL — ABNORMAL LOW (ref 1.7–2.4)

## 2020-10-27 MED ORDER — AMLODIPINE BESYLATE 5 MG PO TABS
5.0000 mg | ORAL_TABLET | Freq: Every day | ORAL | 0 refills | Status: AC
  Filled 2020-10-27: qty 30, 30d supply, fill #0

## 2020-10-27 MED ORDER — INSULIN PEN NEEDLE 31G X 6 MM MISC
1.0000 | Freq: Two times a day (BID) | 0 refills | Status: AC
  Filled 2020-10-27: qty 200, 90d supply, fill #0

## 2020-10-27 MED ORDER — BLOOD GLUCOSE METER KIT
PACK | 0 refills | Status: AC
  Filled 2020-10-27: qty 1, 25d supply, fill #0

## 2020-10-27 MED ORDER — LIP MEDEX EX OINT
TOPICAL_OINTMENT | CUTANEOUS | Status: AC
  Filled 2020-10-27: qty 7

## 2020-10-27 MED ORDER — ATORVASTATIN CALCIUM 40 MG PO TABS
40.0000 mg | ORAL_TABLET | Freq: Every day | ORAL | 0 refills | Status: AC
  Filled 2020-10-27: qty 30, 30d supply, fill #0

## 2020-10-27 MED ORDER — CLOPIDOGREL BISULFATE 75 MG PO TABS
75.0000 mg | ORAL_TABLET | Freq: Every day | ORAL | 0 refills | Status: AC
  Filled 2020-10-27: qty 30, 30d supply, fill #0

## 2020-10-27 MED ORDER — INSULIN GLARGINE 100 UNIT/ML SOLOSTAR PEN
5.0000 [IU] | PEN_INJECTOR | Freq: Two times a day (BID) | SUBCUTANEOUS | 0 refills | Status: AC
  Filled 2020-10-27: qty 7.5, 75d supply, fill #0

## 2020-10-27 MED ORDER — MAGNESIUM SULFATE 2 GM/50ML IV SOLN
2.0000 g | Freq: Once | INTRAVENOUS | Status: AC
  Administered 2020-10-27: 2 g via INTRAVENOUS
  Filled 2020-10-27: qty 50

## 2020-10-27 NOTE — Progress Notes (Signed)
PT Cancellation Note  Patient Details Name: Donna Harrell MRN: [id number] DOB: 01-07-1956   Cancelled Treatment:    Reason Eval/Treat Not Completed: Patient declined, no reason specified. Will check back another time.   Faye Ramsay, PT Acute Rehabilitation  Office: [phone] Pager: [phone]

## 2020-10-27 NOTE — TOC Initial Note (Signed)
Transition of Care [facility]) - Initial/Assessment Note    Patient Details  Name: Donna Harrell MRN: [id number] Date of Birth: 1955/11/03  Transition of Care [facility]) CM/SW Contact:    Armanda Heritage, RN Phone Number: 10/27/2020, 10:00 AM  Clinical Narrative:                 CM spoke with patient's sister Ysabelle re discharge planning.  Anelis reports patient was at [facility] for short term rehab and was released on Friday.  Johnita is here from [place] with the plan to take patient home with her at discharge, states they have made arrangements and scheduled pcp visits for patient in [place].  Destin is hopeful patient will improve quickly and be well enough to be released so they can take her to [place].  No TOC needs identified at this time.  Expected Discharge Plan: Home/Self Care Barriers to Discharge: No Barriers Identified   Patient Goals and CMS Choice Patient states their goals for this hospitalization and ongoing recovery are:: to return to [facility] with sister      Expected Discharge Plan and Services Expected Discharge Plan: Home/Self Care   Discharge Planning Services: CM Consult   Living arrangements for the past 2 months: Apartment,Skilled Nursing Facility                                      Prior Living Arrangements/Services Living arrangements for the past 2 months: Apartment,Skilled Nursing Facility Lives with:: Self Patient language and need for interpreter reviewed:: Yes Do you feel safe going back to the place where you live?: Yes      Need for Family Participation in Patient Care: Yes (Comment) Care giver support system in place?: Yes (comment)   Criminal Activity/Legal Involvement Pertinent to Current Situation/Hospitalization: No - Comment as needed  Activities of Daily Living      Permission Sought/Granted                  Emotional Assessment Appearance:: Appears stated age Attitude/Demeanor/Rapport: Engaged Affect (typically  observed): Accepting     Psych Involvement: No (comment)  Admission diagnosis:  DKA (diabetic ketoacidosis) (HCC) [E11.10] Diabetic ketoacidosis without coma associated with type 1 diabetes mellitus (HCC) [E10.10] Patient Active Problem List   Diagnosis Date Noted  . Right leg swelling 10/25/2020  . AKI (acute kidney injury) (HCC) 10/25/2020  . Goals of care, counseling/discussion 10/25/2020  . Type 1 diabetes mellitus with other specified complication (HCC) 10/03/2020  . Depression 10/03/2020  . Altered mental status 09/24/2020  . Essential hypertension 09/18/2020  . Acute metabolic encephalopathy 09/18/2020  . Elevated troponin 09/18/2020  . Elevated CK 09/18/2020  . Elevated lactic acid level 09/18/2020  . Encephalopathy 09/18/2020  . DKA (diabetic ketoacidosis) (HCC) 09/17/2020   PCP:  Oneita Hurt, No Pharmacy:   [id number] [place] - [address] AT [facility] [address] [facility] [address] [place] [address] Phone: [phone] Fax: [phone]  [facility] [address] [place] [address] Phone: [phone] Fax: [phone]     Social Determinants of Health (SDOH) Interventions    Readmission Risk Interventions No flowsheet data found.

## 2020-10-27 NOTE — Progress Notes (Signed)
Hypoglycemic Event  CBG: 49  Treatment: 8 oz juice/soda  Symptoms: None  Follow-up CBG: Time:0805 CBG Result: 56  Possible Reasons for Event: Unknown  Comments/MD notified: MD Dahal. Order to give D50.   CBG: 56  Treatment: D50  Symptoms: None  Follow-up CBG: Time:0907 CBG Result:220  Possible Reasons for Event: Unknown  Comments/MD notified:     Virgel Manifold

## 2020-10-27 NOTE — Progress Notes (Signed)
PROGRESS NOTE  Donna Harrell  DOB: 1956-04-14  PCP: Donna Harrell EYC:[id number]  DOA: 10/25/2020  LOS: 2 days   Chief Complaint  Patient presents with  . Hyperglycemia  . Altered Mental Status   Brief narrative: Donna Harrell is a 65 y.o. female with PMH significant for diabetes mellitus, recurrent DKA, HTN, blindness.   Patient presented to the ED on 4/9 with altered mental status and hypoglycemia.   Patient has had multiple hospitalization in the past for DKA, most recently discharged to SNF on 3/20.   In the ED, patient was afebrile, tachycardic to 110s, blood pressure low 90s. Labs with blood glucose elevated to 463, lactic acid elevated to 5.2, serum bicarb low at 10, creatinine elevated 1.38, urine ketone elevated, beta hydroxybutyrate level elevated. ABG with pH 7.2 Patient was started on insulin drip and IV fluids per DKA protocol Admitted to hospitalist service  Subjective: Patient was seen and examined this morning.  Middle-aged African-American female.  Alert, awake, confused.  But she is able to tell me that she was not using Lantus after discharge from SNF. Chart reviewed No fever, tachycardia and blood pressure improving.  In fact blood pressure is elevated to 170s this morning. Labs this morning with blood sugar level low at 55, WBC count elevated to 23.4, last lactic acid level was down at 3.1  Assessment/Plan: DKA -Presented with altered mental status, hyperglycemia, elevated blood glucose, low pH, low serum bicarb, elevated ketone level -Patient was initially started on management per DKA protocol with IV insulin drip, IV fluid. -Transitioned from insulin drip to subcutaneous insulin by next morning.  -Adequately hydrated.  Type 1 diabetes mellitus -Last A1c 7.1 on 09/19/2020 -Home meds include Lantus 15 units at bedtime, NovoLog 3 times daily Premeal. -She was given a reduced dose of 10 units of Lantus last night.  Blood sugar dropped as low as 49 this  morning.  She was given orange juice after which blood sugar increase up to 220.   -Based on her brittle nature of diabetes, I will discharge her on Lantus 5 units twice daily.  I called and updated patient's sister Ms. Donna Harrell who understands the complexity of taking care of her blood sugar level because of coexisting cognitive impairment. Recent Labs  Lab 10/26/20 1728 10/26/20 2228 10/27/20 0734 10/27/20 0805 10/27/20 0907  GLUCAP 177* 335* 49* 56* 220*   Acute metabolic encephalopathy  -Patient had altered mental status at presentation, secondary to DKA -Alert and awake.  Pleasant.  Continues to have some cognitive impairment but patient is at baseline.  AKI Acute metabolic acidosis Lactic acidosis -Presented with creatinine elevated 1.37 compared to 0.6 from 3 weeks ago.  Serum bicarb level was low at 10.  On top of with hydroxybutyrate level, patient also had lactic acid level elevated probably due to dehydration from polyuria. -All labs improved. Recent Labs    10/03/20 1104 10/03/20 1526 10/03/20 1955 10/04/20 0140 10/05/20 0758 10/25/20 0958 10/25/20 1300 10/25/20 1835 10/26/20 0908 10/27/20 0531  BUN 21 17 15 13  <5* 23 22 18 13  6*  CREATININE 1.51* 1.00 0.90 0.75 0.60 1.37* 1.38* 0.84 0.66 0.62  CO2 12* 18* 20* 22 23 13* 10* 19* 23 21*   Recent Labs  Lab 10/25/20 0958 10/25/20 1300 10/25/20 1451 10/25/20 1835 10/26/20 0908  LATICACIDVEN 3.7* 5.2* 4.2* 3.1* 1.5   RLE swelling -RLE 12/25/20 ruled out DVT.    Leukocytosis -Likely due to DKA itself.  WBC count improved without antibiotics. Recent Labs  Lab 10/25/20 0958 10/26/20 0116 10/27/20 0531  WBC 15.2* 23.4* 8.3   Hypertension -Blood pressure was low at presentation.  Improving now. -Home meds include amlodipine 5 mg daily, lisinopril 20 mg daily. -Continue both post discharge. -??Patient is also on Plavix and statin.  Continue the same.  Living condition -apparently, patient recently moved  from [place] to [place] and has been living on her own and has since been having issues with her blood sugar, initially hypoglycemia and now hyperglycemia.  Patient's sister came from [place] on 4/8 and brought her home from SNF. Patient's sister plans to take her back to [place] at discharge.  Mobility: Encourage ambulation.  Pending PT eval Code Status:   Code Status: DNR  Nutritional status: Body mass index is 21.04 kg/m.     Diet Order            Diet Carb Modified           Diet heart healthy/carb modified Room service appropriate? Yes; Fluid consistency: Thin  Diet effective now                 DVT prophylaxis: heparin injection 5,000 Units Start: 10/25/20 2200   Antimicrobials:  None Fluid: Normal saline at 75 mill per hour Consultants: None Family Communication: called and updated patient's sister Ms. Donna Harrell.   Status is: Inpatient  Remains inpatient appropriate because: Taking management  Dispo: The patient is from: Home              Anticipated d/c is to: Pending PT eval              Patient currently is not medically stable to d/c.   Difficult to place patient No       Infusions:  . sodium chloride 75 mL/hr at 10/27/20 0522    Scheduled Meds: . amLODipine  5 mg Oral Daily  . atorvastatin  40 mg Oral q1800  . Chlorhexidine Gluconate Cloth  6 each Topical Daily  . clopidogrel  75 mg Oral Daily  . heparin  5,000 Units Subcutaneous Q8H  . insulin aspart  0-5 Units Subcutaneous QHS  . insulin aspart  0-9 Units Subcutaneous TID WC  . insulin glargine  10 Units Subcutaneous QHS  . mouth rinse  15 mL Mouth Rinse BID    Antimicrobials: Anti-infectives (From admission, onward)   None      PRN meds: dextrose   Objective: Vitals:   10/26/20 2229 10/27/20 0149  BP: 133/67 136/69  Pulse: 84 81  Resp: 20 20  Temp: 98 F (36.7 C) 98.8 F (37.1 C)  SpO2: 99% 100%    Intake/Output Summary (Last 24 hours) at 10/27/2020 1127 Last data  filed at 10/27/2020 0929 Gross per 24 hour  Intake 1874.49 ml  Output --  Net 1874.49 ml   Filed Weights   10/25/20 1355 10/25/20 1604 10/26/20 0600  Weight: 56.1 kg 55.7 kg 55.6 kg   Weight change:  Body mass index is 21.04 kg/m.   Physical Exam: General exam: Middle-aged African-American female.  Not in physical distress Skin: No rashes, lesions or ulcers. HEENT: Atraumatic, normocephalic, no obvious bleeding Lungs: Clear to auscultation bilaterally CVS: Regular rate and rhythm, no murmur GI/Abd soft, nontender, nondistended, bowel sound present CNS: Alert, awake, remains confused Psychiatry: Depressed look Extremities: No pedal edema, no calf tenderness  Data Review: I have personally reviewed the laboratory data and studies available.  Recent Labs  Lab 10/25/20 0958 10/26/20 0116 10/27/20 0531  WBC  15.2* 23.4* 8.3  NEUTROABS 13.2*  --  5.1  HGB 12.0 11.9* 11.5*  HCT 39.9 36.6 35.1*  MCV 83.0 77.7* 77.0*  PLT 196 179 147*   Recent Labs  Lab 10/25/20 0958 10/25/20 1300 10/25/20 1835 10/26/20 0908 10/27/20 0531  NA 133* 139 140 143 140  K 5.3* 4.1 4.1 3.8 3.6  CL 97* 109 110 113* 111  CO2 13* 10* 19* 23 21*  GLUCOSE 658* 463* 167* 128* 56*  BUN 23 22 18 13  6*  CREATININE 1.37* 1.38* 0.84 0.66 0.62  CALCIUM 9.6 9.2 9.6 9.3 8.8*  MG  --   --  1.8  --  1.6*  PHOS  --   --   --   --  3.4    F/u labs ordered Unresulted Labs (From admission, onward)          Start     Ordered   10/27/20 0500  CBC with Differential/Platelet  Daily,   R     Question:  Specimen collection method  Answer:  Lab=Lab collect   10/26/20 1143   10/27/20 0500  Basic metabolic panel  Daily,   R     Question:  Specimen collection method  Answer:  Lab=Lab collect   10/26/20 1143          Signed, 12/26/20, MD [facility] 10/27/2020

## 2020-10-27 NOTE — Discharge Summary (Signed)
Physician Discharge Summary  Donna Harrell MVV:[id number] DOB: 1955/12/29 DOA: 10/25/2020  PCP: Pcp, No  Admit date: 10/25/2020 Discharge date: 10/27/2020  Admitted From: Home Discharge disposition: Home   Code Status: DNR  Diet Recommendation: Diabetic diet, cardiac diet  Discharge Diagnosis:   Active Problems:   DKA (diabetic ketoacidosis) ([place])   Essential hypertension   Acute metabolic encephalopathy   Altered mental status   Type 1 diabetes mellitus with other specified complication (HCC)   Right leg swelling   AKI (acute kidney injury) ([place])   Goals of care, counseling/discussion   Chief Complaint  Patient presents with  . Hyperglycemia  . Altered Mental Status   Brief narrative: Donna Harrell is a 65 y.o. female with PMH significant for diabetes mellitus, recurrent DKA, HTN, blindness.   Patient presented to the ED on 4/9 with altered mental status and hypoglycemia.   Patient has had multiple hospitalization in the past for DKA, most recently discharged to SNF on 3/20.   In the ED, patient was afebrile, tachycardic to 110s, blood pressure low 90s. Labs with blood glucose elevated to 463, lactic acid elevated to 5.2, serum bicarb low at 10, creatinine elevated 1.38, urine ketone elevated, beta hydroxybutyrate level elevated. ABG with pH 7.2 Patient was started on insulin drip and IV fluids per DKA protocol Admitted to hospitalist service  Subjective: Patient was seen and examined this morning.  Middle-aged African-American female.  Alert, awake, confused.  But she is able to tell me that she was not using Lantus after discharge from SNF. Chart reviewed No fever, tachycardia and blood pressure improving.  In fact blood pressure is elevated to 170s this morning. Labs this morning with blood sugar level low at 55, WBC count elevated to 23.4, last lactic acid level was down at 3.1  Assessment/Plan: DKA -Presented with altered mental status, hyperglycemia,  elevated blood glucose, low pH, low serum bicarb, elevated ketone level -Patient was initially started on management per DKA protocol with IV insulin drip, IV fluid. -Transitioned from insulin drip to subcutaneous insulin by next morning.  -Adequately hydrated.  Type 1 diabetes mellitus -Last A1c 7.1 on 09/19/2020 -Home meds include Lantus 15 units at bedtime, NovoLog 3 times daily Premeal. -She was given a reduced dose of 10 units of Lantus last night.  Blood sugar dropped as low as 49 this morning.  She was given orange juice after which blood sugar increase up to 220.   -Based on her brittle nature of diabetes, I will discharge her on Lantus 5 units twice daily.  I called and updated patient's sister Ms. Mardene Celeste who understands the complexity of taking care of her blood sugar level because of coexisting cognitive impairment. Recent Labs  Lab 10/26/20 2228 10/27/20 0734 10/27/20 0805 10/27/20 0907 10/27/20 1206  GLUCAP 335* 49* 56* 220* 300*   Acute metabolic encephalopathy  -Patient had altered mental status at presentation, secondary to DKA -Alert and awake.  Pleasant.  Continues to have some cognitive impairment but patient is at baseline.  AKI Acute metabolic acidosis Lactic acidosis -Presented with creatinine elevated 1.37 compared to 0.6 from 3 weeks ago.  Serum bicarb level was low at 10.  On top of with hydroxybutyrate level, patient also had lactic acid level elevated probably due to dehydration from polyuria. -All labs improved. Recent Labs    10/03/20 1104 10/03/20 1526 10/03/20 1955 10/04/20 0140 10/05/20 0758 10/25/20 0958 10/25/20 1300 10/25/20 1835 10/26/20 0908 10/27/20 0531  BUN 21 17 15 13  <5* 23  22 18 13  6*  CREATININE 1.51* 1.00 0.90 0.75 0.60 1.37* 1.38* 0.84 0.66 0.62  CO2 12* 18* 20* 22 23 13* 10* 19* 23 21*   Recent Labs  Lab 10/25/20 0958 10/25/20 1300 10/25/20 1451 10/25/20 1835 10/26/20 0908  LATICACIDVEN 3.7* 5.2* 4.2* 3.1* 1.5   RLE  swelling -RLE [place] ruled out DVT.    Leukocytosis -Likely due to DKA itself.  WBC count improved without antibiotics. Recent Labs  Lab 10/25/20 0958 10/26/20 0116 10/27/20 0531  WBC 15.2* 23.4* 8.3   Hypertension -Blood pressure was low at presentation.  Improving now. -Home meds include amlodipine 5 mg daily, lisinopril 20 mg daily. -Continue both post discharge. -??Patient is also on Plavix and statin.  Continue the same.  Living condition -apparently, patient recently moved from [place] to [place] and has been living on her own and has since been having issues with her blood sugar, initially hypoglycemia and now hyperglycemia.  Patient's sister came from [place] on 4/8 and brought her home from SNF. Patient's sister plans to take her back to [place] at discharge.   Wound care: Wound / Incision (Open or Dehisced) 09/17/20 Non-pressure wound Toe (Comment  which one) Anterior;Left (Active)  Date First Assessed/Time First Assessed: 09/17/20 1004   Wound Type: Non-pressure wound  Location: Toe (Comment  which one)  Location Orientation: Anterior;Left  Present on Admission: Yes    Assessments 09/24/2020  9:00 AM 10/27/2020  8:39 AM  Dressing Type Foam - Lift dressing to assess site every shift None  Dressing Status Clean;Dry;Intact --  Dressing Change Frequency PRN --  Drainage Amount -- None     No Linked orders to display    Discharge Exam:   Vitals:   10/26/20 1511 10/26/20 2229 10/27/20 0149 10/27/20 1303  BP: (!) 148/69 133/67 136/69 128/80  Pulse: 89 84 81 72  Resp: 14 20 20 14   Temp: 98.6 F (37 C) 98 F (36.7 C) 98.8 F (37.1 C)   TempSrc: Oral     SpO2: 100% 99% 100% 99%  Weight:      Height:        Body mass index is 21.04 kg/m.  General exam: Middle-aged African-American female.  Not in physical distress Skin: No rashes, lesions or ulcers. HEENT: Atraumatic, normocephalic, no obvious bleeding Lungs: Clear to auscultation bilaterally CVS:  Regular rate and rhythm, no murmur GI/Abd soft, nontender, nondistended, bowel sound present CNS: Alert, awake, remains confused Psychiatry: Depressed look Extremities: No pedal edema, no calf tenderness  Follow ups:   Discharge Instructions    Diet Carb Modified   Complete by: As directed    Increase activity slowly   Complete by: As directed    Leave dressing on - Keep it clean, dry, and intact until clinic visit   Complete by: As directed       [facility] Follow up.   Contact information: [address] [place] [address] [phone]              Recommendations for Outpatient Follow-Up:   1. Follow-up with PCP as an outpatient  Discharge Instructions:  Follow with Primary MD Pcp, No in 7 days   Get CBC/BMP checked in next visit within 1 week by PCP or SNF MD ( we routinely change or add medications that can affect your baseline labs and fluid status, therefore we recommend that you get the mentioned basic workup next visit with  your PCP, your PCP may decide not to get them or add new tests based on their clinical decision)  On your next visit with your PCP, please Get Medicines reviewed and adjusted.  Please request your PCP  to go over all Hospital Tests and Procedure/Radiological results at the follow up, please get all Hospital records sent to your Prim MD by signing hospital release before you go home.  Activity: As tolerated with Full fall precautions use walker/cane & assistance as needed  For Heart failure patients - Check your Weight same time everyday, if you gain over 2 pounds, or you develop in leg swelling, experience more shortness of breath or chest pain, call your Primary MD immediately. Follow Cardiac Low Salt Diet and 1.5 lit/day fluid restriction.  If you have smoked or chewed Tobacco in the last 2 yrs please stop smoking, stop any regular Alcohol  and or any  Recreational drug use.  If you experience worsening of your admission symptoms, develop shortness of breath, life threatening emergency, suicidal or homicidal thoughts you must seek medical attention immediately by calling 911 or calling your MD immediately  if symptoms less severe.  You Must read complete instructions/literature along with all the possible adverse reactions/side effects for all the Medicines you take and that have been prescribed to you. Take any new Medicines after you have completely understood and accpet all the possible adverse reactions/side effects.   Do not drive, operate heavy machinery, perform activities at heights, swimming or participation in water activities or provide baby sitting services if your were admitted for syncope or siezures until you have seen by Primary MD or a Neurologist and advised to do so again.  Do not drive when taking Pain medications.  Do not take more than prescribed Pain, Sleep and Anxiety Medications  Wear Seat belts while driving.   Please note You were cared for by a hospitalist during your hospital stay. If you have any questions about your discharge medications or the care you received while you were in the hospital after you are discharged, you can call the unit and asked to speak with the hospitalist on call if the hospitalist that took care of you is not available. Once you are discharged, your primary care physician will handle any further medical issues. Please note that NO REFILLS for any discharge medications will be authorized once you are discharged, as it is imperative that you return to your primary care physician (or establish a relationship with a primary care physician if you do not have one) for your aftercare needs so that they can reassess your need for medications and monitor your lab values.    Allergies as of 10/27/2020   No Known Allergies     Medication List    STOP taking these medications   Admelog SoloStar  100 UNIT/ML KwikPen Generic drug: insulin lispro   insulin aspart 100 UNIT/ML injection Commonly known as: novoLOG   insulin glargine 100 UNIT/ML injection Commonly known as: LANTUS Replaced by: insulin glargine 100 UNIT/ML Solostar Pen     TAKE these medications   amLODipine 5 MG tablet Commonly known as: NORVASC Take 1 tablet (5 mg total) by mouth daily.   atorvastatin 40 MG tablet Commonly known as: LIPITOR Take 1 tablet (40 mg total) by mouth daily.   blood glucose meter kit and supplies Dispense based on patient and insurance preference. Use up to four times daily as directed. (FOR ICD-10 E10.9, E11.9).   clobetasol cream 0.05 % Commonly  known as: TEMOVATE Apply 1 application topically every other day.   clopidogrel 75 MG tablet Commonly known as: PLAVIX Take 1 tablet (75 mg total) by mouth daily.   feeding supplement (GLUCERNA SHAKE) Liqd Take 237 mLs by mouth 2 (two) times daily between meals.   insulin glargine 100 UNIT/ML Solostar Pen Commonly known as: LANTUS Inject 5 Units into the skin 2 (two) times daily. Replaces: insulin glargine 100 UNIT/ML injection   lisinopril 20 MG tablet Commonly known as: ZESTRIL Take 20 mg by mouth daily.            Discharge Care Instructions  (From admission, onward)         Start     Ordered   10/27/20 0000  Leave dressing on - Keep it clean, dry, and intact until clinic visit        10/27/20 1127          Time coordinating discharge: 35 minutes  The results of significant diagnostics from this hospitalization (including imaging, microbiology, ancillary and laboratory) are listed below for reference.    Procedures and Diagnostic Studies:   CT Head Wo Contrast  Result Date: 10/25/2020 CLINICAL DATA:  Altered mental status/delirium EXAM: CT HEAD WITHOUT CONTRAST TECHNIQUE: Contiguous axial images were obtained from the base of the skull through the vertex without intravenous contrast. COMPARISON:  Head CT  September 17, 2020; brain MRI September 18, 2020 FINDINGS: Brain: Ventricles and sulci are normal in size and configuration. There is no appreciable intracranial mass, hemorrhage, extra-axial fluid collection, or midline shift. The brain parenchyma appears unremarkable. No demonstrable acute infarct. Vascular: No hyperdense vessel. There are foci of calcification in each carotid siphon region. Skull: The bony calvarium appears intact. Sinuses/Orbits: Visualized paranasal sinuses are clear. Evidence of probable hemorrhage within the right globe. Status post scleral banding. This appearance is stable compared to prior study. Left globe appears normal. Orbits otherwise appear symmetric bilaterally. Other: Mastoid air cells clear. IMPRESSION: 1.  Normal appearing brain parenchyma.  No mass or hemorrhage. 2. Presumed prior hemorrhage within the right globe with scleral banding noted. Appearance stable compared to prior study. 3.  Foci of arterial vascular calcification noted. Electronically Signed   By: Lowella Grip III M.D.   On: 10/25/2020 14:13   DG Chest Port 1 View  Result Date: 10/25/2020 CLINICAL DATA:  Concern for sepsis. Hypertension. Diabetes mellitus. EXAM: PORTABLE CHEST 1 VIEW COMPARISON:  October 03, 2020 FINDINGS: The lungs are clear. Heart is slightly enlarged with pulmonary vascularity normal. No adenopathy no bone lesions. IMPRESSION: Heart mildly enlarged.  Lungs clear. Electronically Signed   By: Lowella Grip III M.D.   On: 10/25/2020 10:46   VAS [place] LOWER EXTREMITY VENOUS (DVT) (ONLY [facility] & [facility])  Result Date: 10/26/2020  Lower Venous DVT Study Indications: Swelling.  Comparison Study: Previous exam 09/22/20 - negative Performing Technologist: Rogelia Rohrer  Examination Guidelines: A complete evaluation includes B-mode imaging, spectral Doppler, color Doppler, and power Doppler as needed of all accessible portions of each vessel. Bilateral testing is considered an integral part of a complete examination.  Limited examinations for reoccurring indications may be performed as noted. The reflux portion of the exam is performed with the patient in reverse Trendelenburg.  +---------+---------------+---------+-----------+----------+--------------+ RIGHT    CompressibilityPhasicitySpontaneityPropertiesThrombus Aging +---------+---------------+---------+-----------+----------+--------------+ CFV      Full           Yes      Yes                                 +---------+---------------+---------+-----------+----------+--------------+  SFJ      Full                                                        +---------+---------------+---------+-----------+----------+--------------+ FV Prox  Full           Yes      Yes                                 +---------+---------------+---------+-----------+----------+--------------+ FV Mid   Full           Yes      Yes                                 +---------+---------------+---------+-----------+----------+--------------+ FV DistalFull           Yes      Yes                                 +---------+---------------+---------+-----------+----------+--------------+ PFV      Full                                                        +---------+---------------+---------+-----------+----------+--------------+ POP      Full           Yes      Yes                                 +---------+---------------+---------+-----------+----------+--------------+ PTV      Full                                                        +---------+---------------+---------+-----------+----------+--------------+ PERO     Full                                                        +---------+---------------+---------+-----------+----------+--------------+ 2.7 x 0.83 x 1.9 cm popliteal cyst  +----+---------------+---------+-----------+----------+--------------+ LEFTCompressibilityPhasicitySpontaneityPropertiesThrombus Aging  +----+---------------+---------+-----------+----------+--------------+ CFV Full           Yes      Yes                                 +----+---------------+---------+-----------+----------+--------------+     Summary: RIGHT: - There is no evidence of deep vein thrombosis in the lower extremity. - There is no evidence of superficial venous thrombosis.  - A cystic structure is found in the popliteal fossa.  LEFT: - No evidence of common femoral vein obstruction.  *See table(s) above for measurements and observations. Electronically signed by Deitra Mayo MD on 10/26/2020  at 4:07:07 PM.    Final      Labs:   Basic Metabolic Panel: Recent Labs  Lab 10/25/20 0958 10/25/20 1300 10/25/20 1835 10/26/20 0908 10/27/20 0531  NA 133* 139 140 143 140  K 5.3* 4.1 4.1 3.8 3.6  CL 97* 109 110 113* 111  CO2 13* 10* 19* 23 21*  GLUCOSE 658* 463* 167* 128* 56*  BUN 23 22 18 13  6*  CREATININE 1.37* 1.38* 0.84 0.66 0.62  CALCIUM 9.6 9.2 9.6 9.3 8.8*  MG  --   --  1.8  --  1.6*  PHOS  --   --   --   --  3.4   GFR Estimated Creatinine Clearance: 63 mL/min (by C-G formula based on SCr of 0.62 mg/dL). Liver Function Tests: Recent Labs  Lab 10/25/20 0958  AST 22  ALT 20  ALKPHOS 77  BILITOT 1.8*  PROT 7.0  ALBUMIN 4.1   No results for input(s): LIPASE, AMYLASE in the last 168 hours. No results for input(s): AMMONIA in the last 168 hours. Coagulation profile Recent Labs  Lab 10/25/20 0958  INR 1.0    CBC: Recent Labs  Lab 10/25/20 0958 10/26/20 0116 10/27/20 0531  WBC 15.2* 23.4* 8.3  NEUTROABS 13.2*  --  5.1  HGB 12.0 11.9* 11.5*  HCT 39.9 36.6 35.1*  MCV 83.0 77.7* 77.0*  PLT 196 179 147*   Cardiac Enzymes: No results for input(s): CKTOTAL, CKMB, CKMBINDEX, TROPONINI in the last 168 hours. BNP: Invalid input(s): POCBNP CBG: Recent Labs  Lab 10/26/20 2228 10/27/20 0734 10/27/20 0805 10/27/20 0907 10/27/20 1206  GLUCAP 335* 49* 56* 220* 239*   D-Dimer No  results for input(s): DDIMER in the last 72 hours. Hgb A1c No results for input(s): HGBA1C in the last 72 hours. Lipid Profile No results for input(s): CHOL, HDL, LDLCALC, TRIG, CHOLHDL, LDLDIRECT in the last 72 hours. Thyroid function studies No results for input(s): TSH, T4TOTAL, T3FREE, THYROIDAB in the last 72 hours.  Invalid input(s): FREET3 Anemia work up No results for input(s): VITAMINB12, FOLATE, FERRITIN, TIBC, IRON, RETICCTPCT in the last 72 hours. Microbiology Recent Results (from the past 240 hour(s))  Blood culture (routine single)     Status: None (Preliminary result)   Collection Time: 10/25/20  9:58 AM   Specimen: BLOOD  Result Value Ref Range Status   Specimen Description   Final    BLOOD LEFT ARM Performed at [facility] [address], [place], [place] [address]    Special Requests   Final    BOTTLES DRAWN AEROBIC AND ANAEROBIC Blood Culture adequate volume Performed at [facility] [address] [place], [place] [address]    Culture   Final    NO GROWTH 2 DAYS Performed at [facility] [address] [place], [place] [address]    Report Status PENDING  Incomplete  Resp Panel by RT-PCR (Flu A&B, Covid) Nasopharyngeal Swab     Status: None   Collection Time: 10/25/20 10:37 AM   Specimen: Nasopharyngeal Swab; Nasopharyngeal(NP) swabs in vial transport medium  Result Value Ref Range Status   SARS Coronavirus 2 by RT PCR NEGATIVE NEGATIVE Final    Comment: (NOTE) SARS-CoV-2 target nucleic acids are NOT DETECTED.  The SARS-CoV-2 RNA is generally detectable in upper respiratory specimens during the acute phase of infection. The lowest concentration of SARS-CoV-2 viral copies this assay can detect is 138 copies/mL. A negative result does not preclude SARS-Cov-2 infection and  should not be used as the sole basis for treatment or other patient management decisions. A negative result may occur with  improper  specimen collection/handling, submission of specimen other than nasopharyngeal swab, presence of viral mutation(s) within the areas targeted by this assay, and inadequate number of viral copies(<138 copies/mL). A negative result must be combined with clinical observations, patient history, and epidemiological information. The expected result is Negative.  Fact Sheet for Patients:  EntrepreneurPulse.com.au  Fact Sheet for Healthcare Providers:  IncredibleEmployment.be  This test is no t yet approved or cleared by the [place] FDA and  has been authorized for detection and/or diagnosis of SARS-CoV-2 by FDA under an Emergency Use Authorization (EUA). This EUA will remain  in effect (meaning this test can be used) for the duration of the COVID-19 declaration under Section 564(b)(1) of the Act, 21 U.S.C.section 360bbb-3(b)(1), unless the authorization is terminated  or revoked sooner.       Influenza A by PCR NEGATIVE NEGATIVE Final   Influenza B by PCR NEGATIVE NEGATIVE Final    Comment: (NOTE) The Xpert Xpress SARS-CoV-2/FLU/RSV plus assay is intended as an aid in the diagnosis of influenza from Nasopharyngeal swab specimens and should not be used as a sole basis for treatment. Nasal washings and aspirates are unacceptable for Xpert Xpress SARS-CoV-2/FLU/RSV testing.  Fact Sheet for Patients: EntrepreneurPulse.com.au  Fact Sheet for Healthcare Providers: IncredibleEmployment.be  This test is not yet approved or cleared by the [place] FDA and has been authorized for detection and/or diagnosis of SARS-CoV-2 by FDA under an Emergency Use Authorization (EUA). This EUA will remain in effect (meaning this test can be used) for the duration of the COVID-19 declaration under Section 564(b)(1) of the Act, 21 U.S.C. section 360bbb-3(b)(1), unless the authorization is terminated or revoked.  Performed at  [facility], [place] [address] [place], [place] [address]   Urine culture     Status: None   Collection Time: 10/25/20  1:09 PM   Specimen: In/Out Cath Urine  Result Value Ref Range Status   Specimen Description   Final    IN/OUT CATH URINE Performed at [facility] [address], [place], [place] [address]    Special Requests   Final    NONE Performed at [facility], [place] [address] [place], [place] [address]    Culture   Final    NO GROWTH Performed at [facility], [place] [address], [place], [place] [address]    Report Status 10/26/2020 FINAL  Final  MRSA PCR Screening     Status: None   Collection Time: 10/25/20  4:21 PM   Specimen: Nasal Mucosa; Nasopharyngeal  Result Value Ref Range Status   MRSA by PCR NEGATIVE NEGATIVE Final    Comment:        The GeneXpert MRSA Assay (FDA approved for NASAL specimens only), is one component of a comprehensive MRSA colonization surveillance program. It is not intended to diagnose MRSA infection nor to guide or monitor treatment for MRSA infections. Performed at [facility], [place] [address], [place], [place] [address]      Signed: Marlowe Aschoff Lawonda Pretlow  [facility] 10/27/2020, 3:40 PM

## 2020-10-30 LAB — CULTURE, BLOOD (SINGLE)
Culture: NO GROWTH
Special Requests: ADEQUATE

## 2020-10-31 ENCOUNTER — Other Ambulatory Visit (HOSPITAL_COMMUNITY): Payer: Self-pay

## 2020-10-31 ENCOUNTER — Encounter: Payer: Self-pay | Admitting: Cardiology

## 2020-11-06 ENCOUNTER — Other Ambulatory Visit (HOSPITAL_COMMUNITY): Payer: Self-pay

## 2020-11-12 ENCOUNTER — Ambulatory Visit: Payer: Medicare Other | Admitting: Cardiology

## 2021-03-07 IMAGING — DX DG CHEST 1V PORT
1 series · 1 of 1 positions shown · non-contrast
Comparison: None.

CLINICAL DATA: Altered level of consciousness, found down

EXAM:
PORTABLE CHEST 1 VIEW

[chest ap]
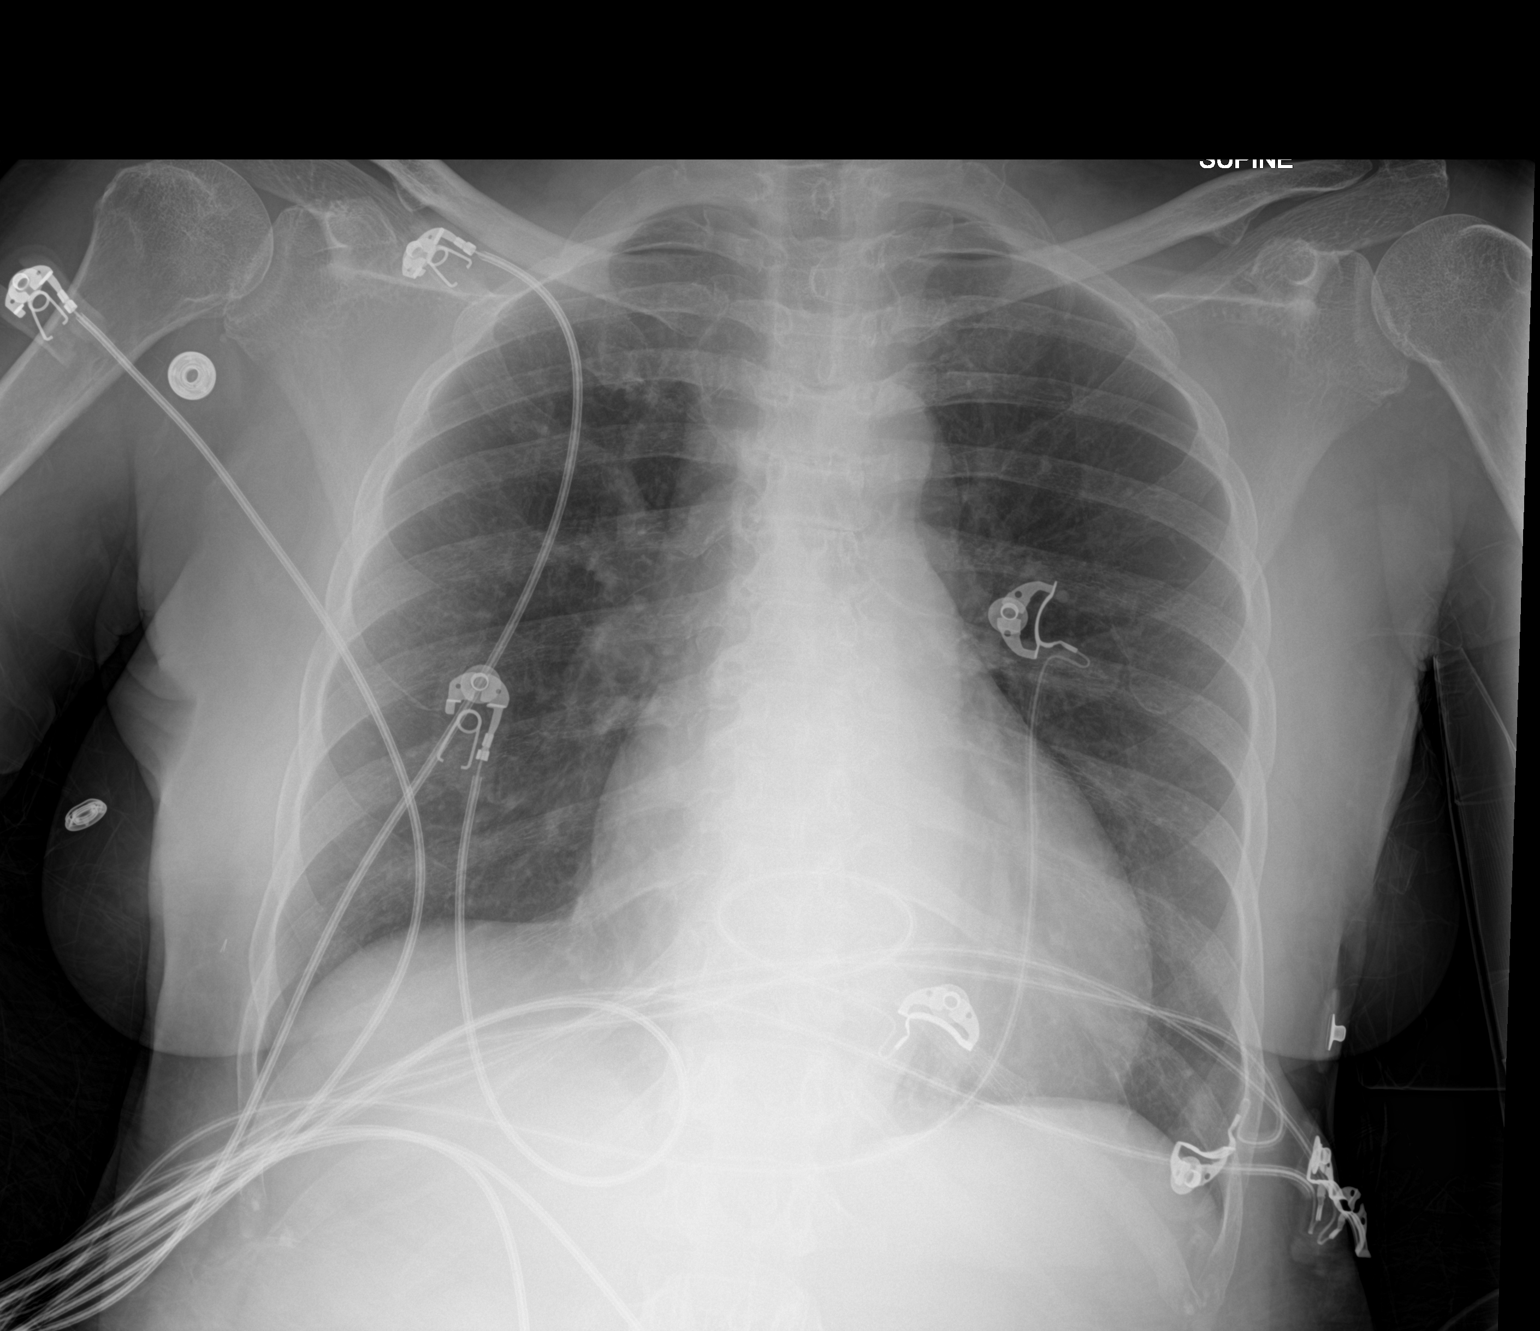

[1 of 1 positions shown; findings below may reference images not displayed]

FINDINGS: The heart size and mediastinal contours are within normal limits.
Both lungs are clear. The visualized skeletal structures are
unremarkable.
IMPRESSION: No active disease.

## 2021-03-07 IMAGING — CT CT CERVICAL SPINE W/O CM
3 of 4 series · 10 of 33 positions shown, 12 images · non-contrast
Comparison: None.

CLINICAL DATA: Found on floor

EXAM:
CT CERVICAL SPINE WITHOUT CONTRAST
TECHNIQUE: Multidetector CT imaging of the cervical spine was performed without
intravenous contrast. Multiplanar CT image reconstructions were also
generated.

[Series 5: orthogonal bone · axial · 0.18mm/px · z∈[-278,-222]mm · 2 of 90 slices shown, 3 images]
[im 30/90  soft-tissue]
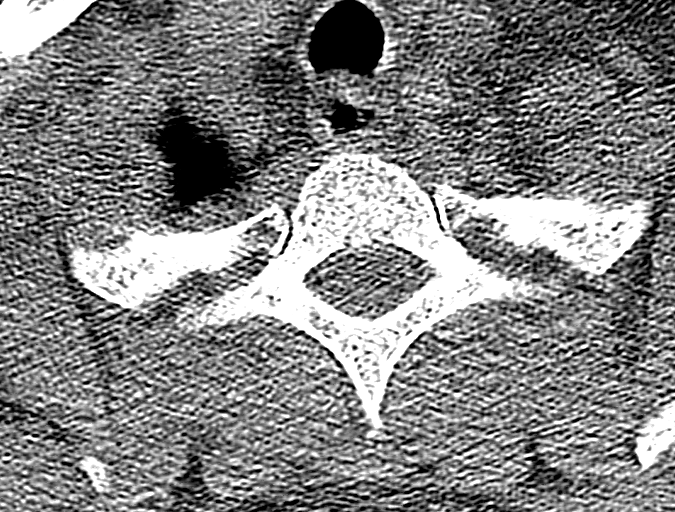
[im 30/90  bone]
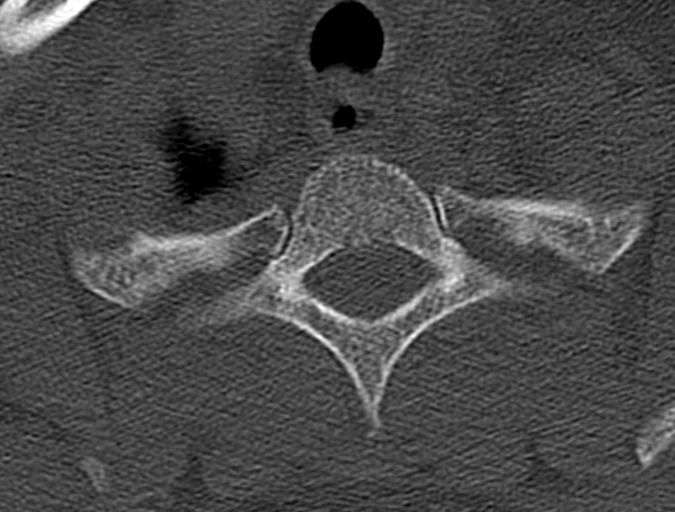
[im 60/90  bone]
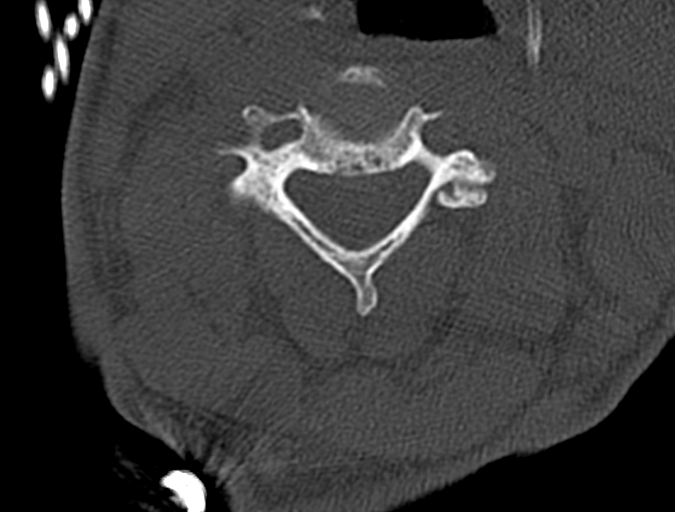

[Series 6: coronal bone · coronal · 0.22mm/px · 3 of 49 slices shown]
[im 10/49  bone]
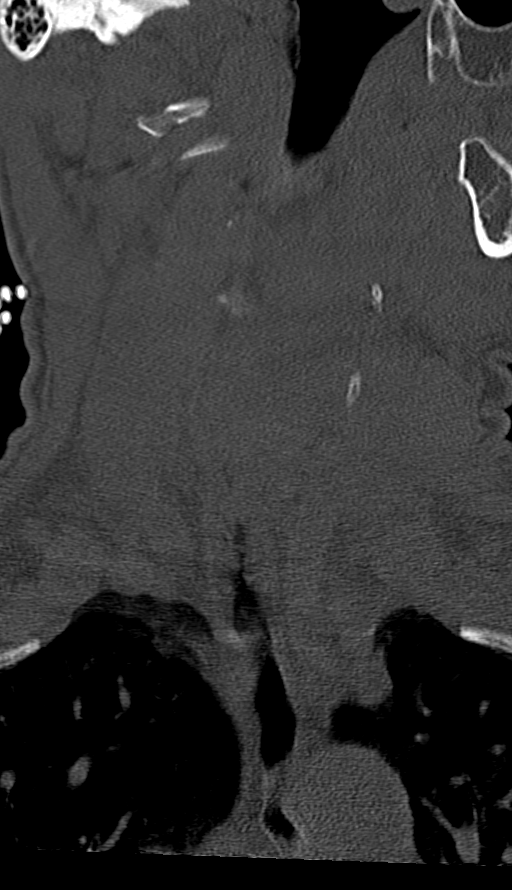
[im 20/49  bone]
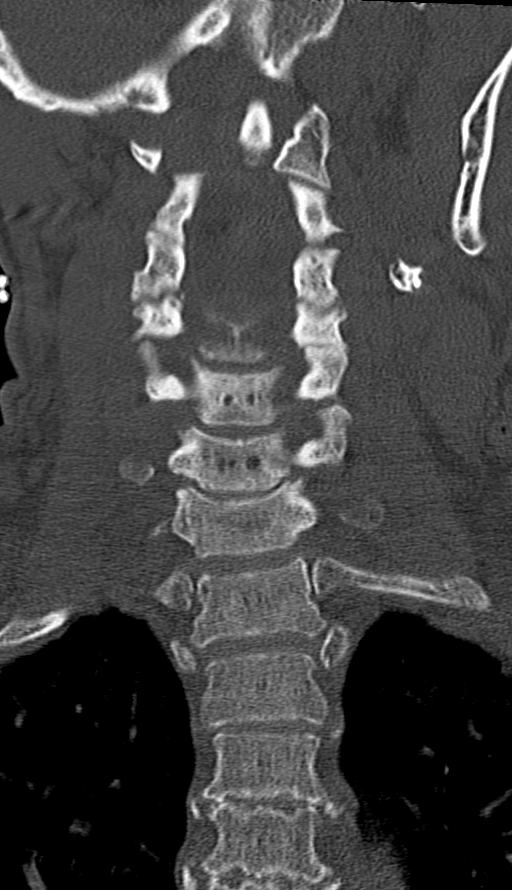
[im 29/49  bone]
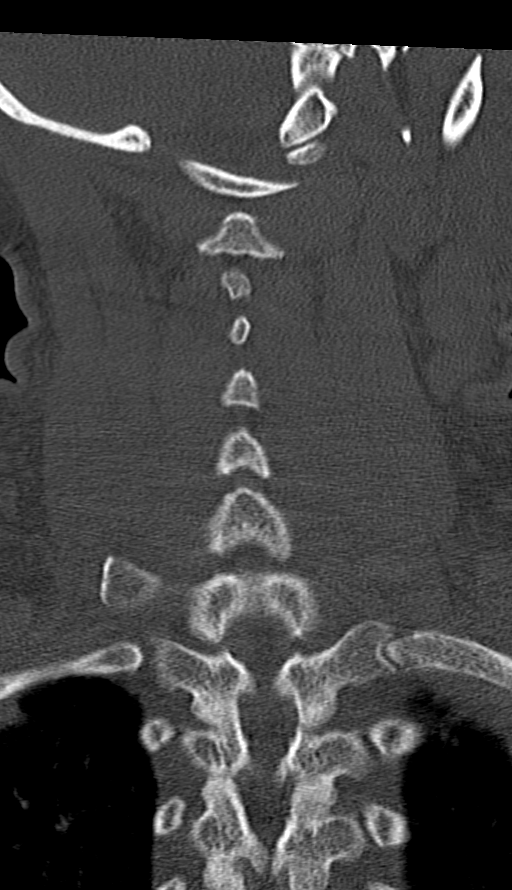

[Series 7: sagittal bone · sagittal · 0.20mm/px · 5 of 53 slices shown, 6 images]
[im 18/53  bone]
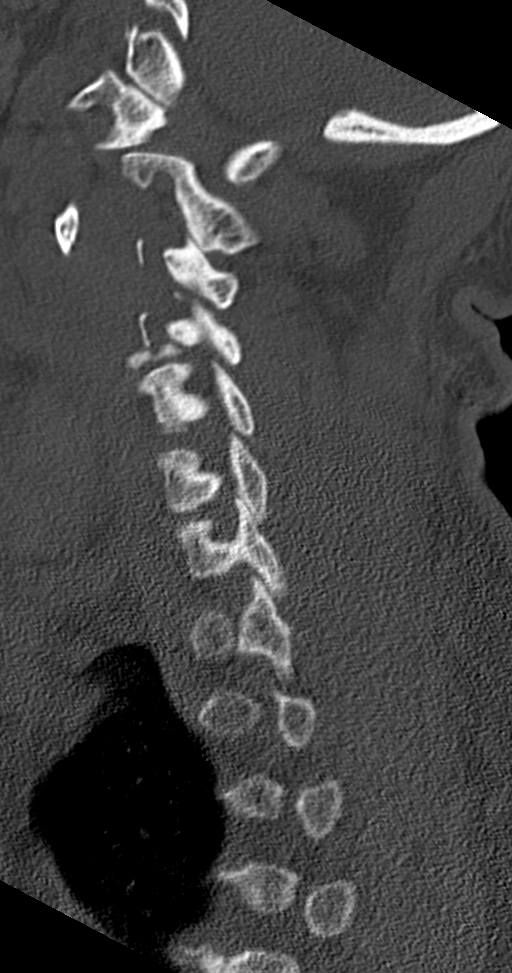
[im 22/53  bone]
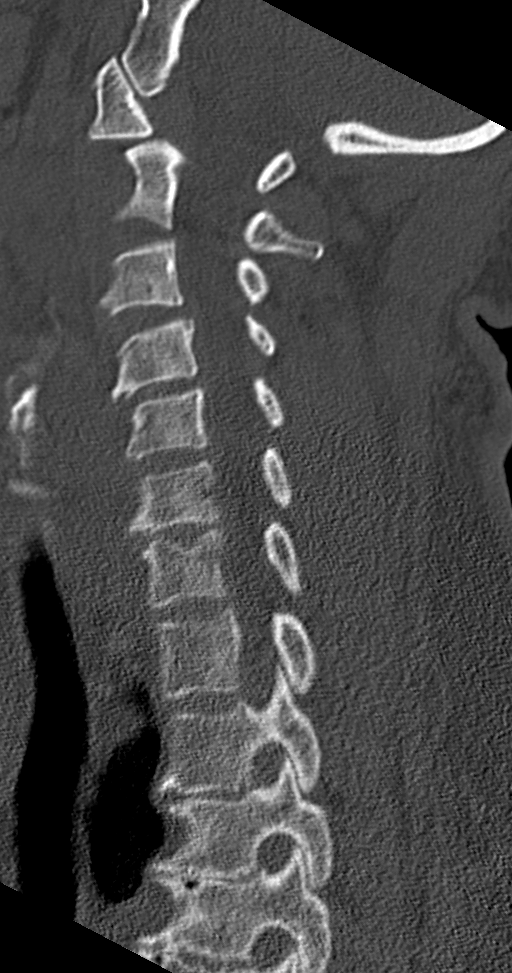
[im 27/53  soft-tissue]
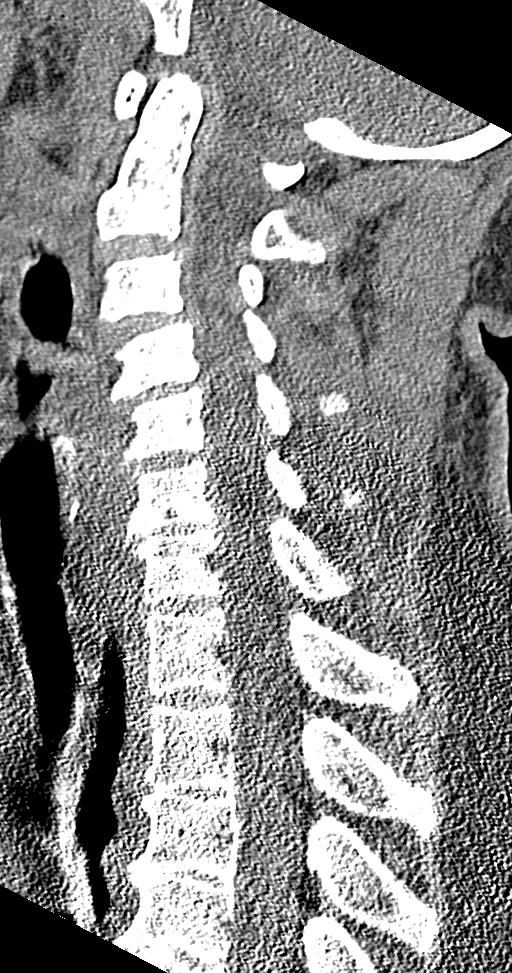
[im 27/53  bone]
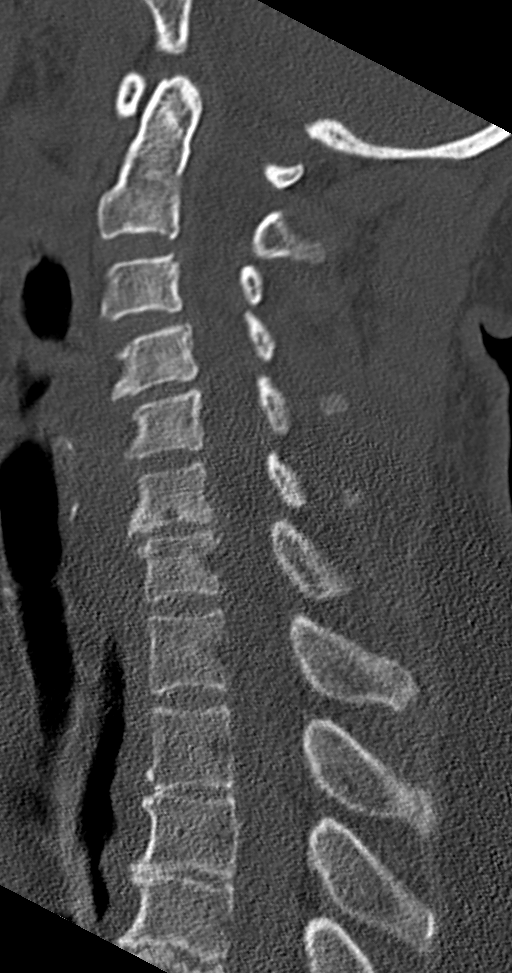
[im 31/53  bone]
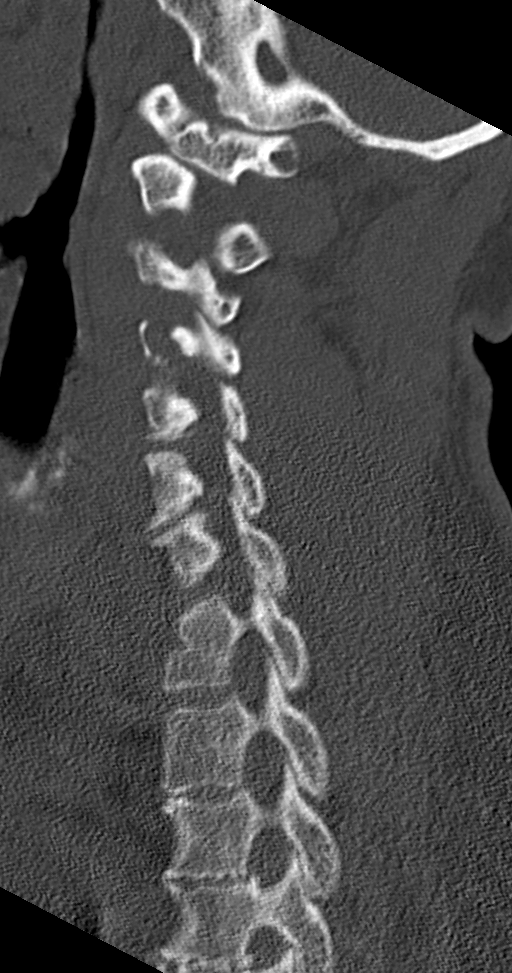
[im 35/53  bone]
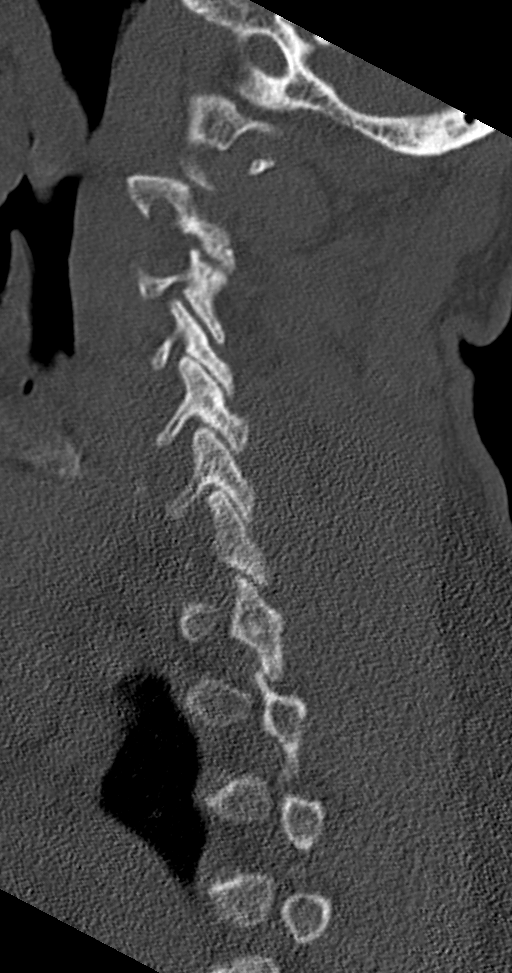

[10 of 33 positions shown; findings below may reference images not displayed]

FINDINGS: Alignment: Sagittal alignment shows trace anterolisthesis C3 on C4.
Facet alignment is within normal limits. There is marked rotation
of C1 on C2 presumably related to head positioning.

Skull base and vertebrae: No acute fracture. No primary bone lesion
or focal pathologic process.

Soft tissues and spinal canal: No prevertebral fluid or swelling. No
visible canal hematoma.

Disc levels: Mild degenerative changes C4-C5 and C6-C7. Facet
degenerative change at multiple levels

Upper chest: Negative.

Other: None
IMPRESSION: No fracture identified. There is marked rotation of C1 on C2 which
is presumably related to head positioning.

## 2021-03-19 DEATH — deceased

## 2021-03-23 IMAGING — DX DG CHEST 1V PORT
1 series · 1 of 1 positions shown · non-contrast
Comparison: September 17, 2020.

CLINICAL DATA: Shortness of breath.

EXAM:
PORTABLE CHEST 1 VIEW

[chest ap]
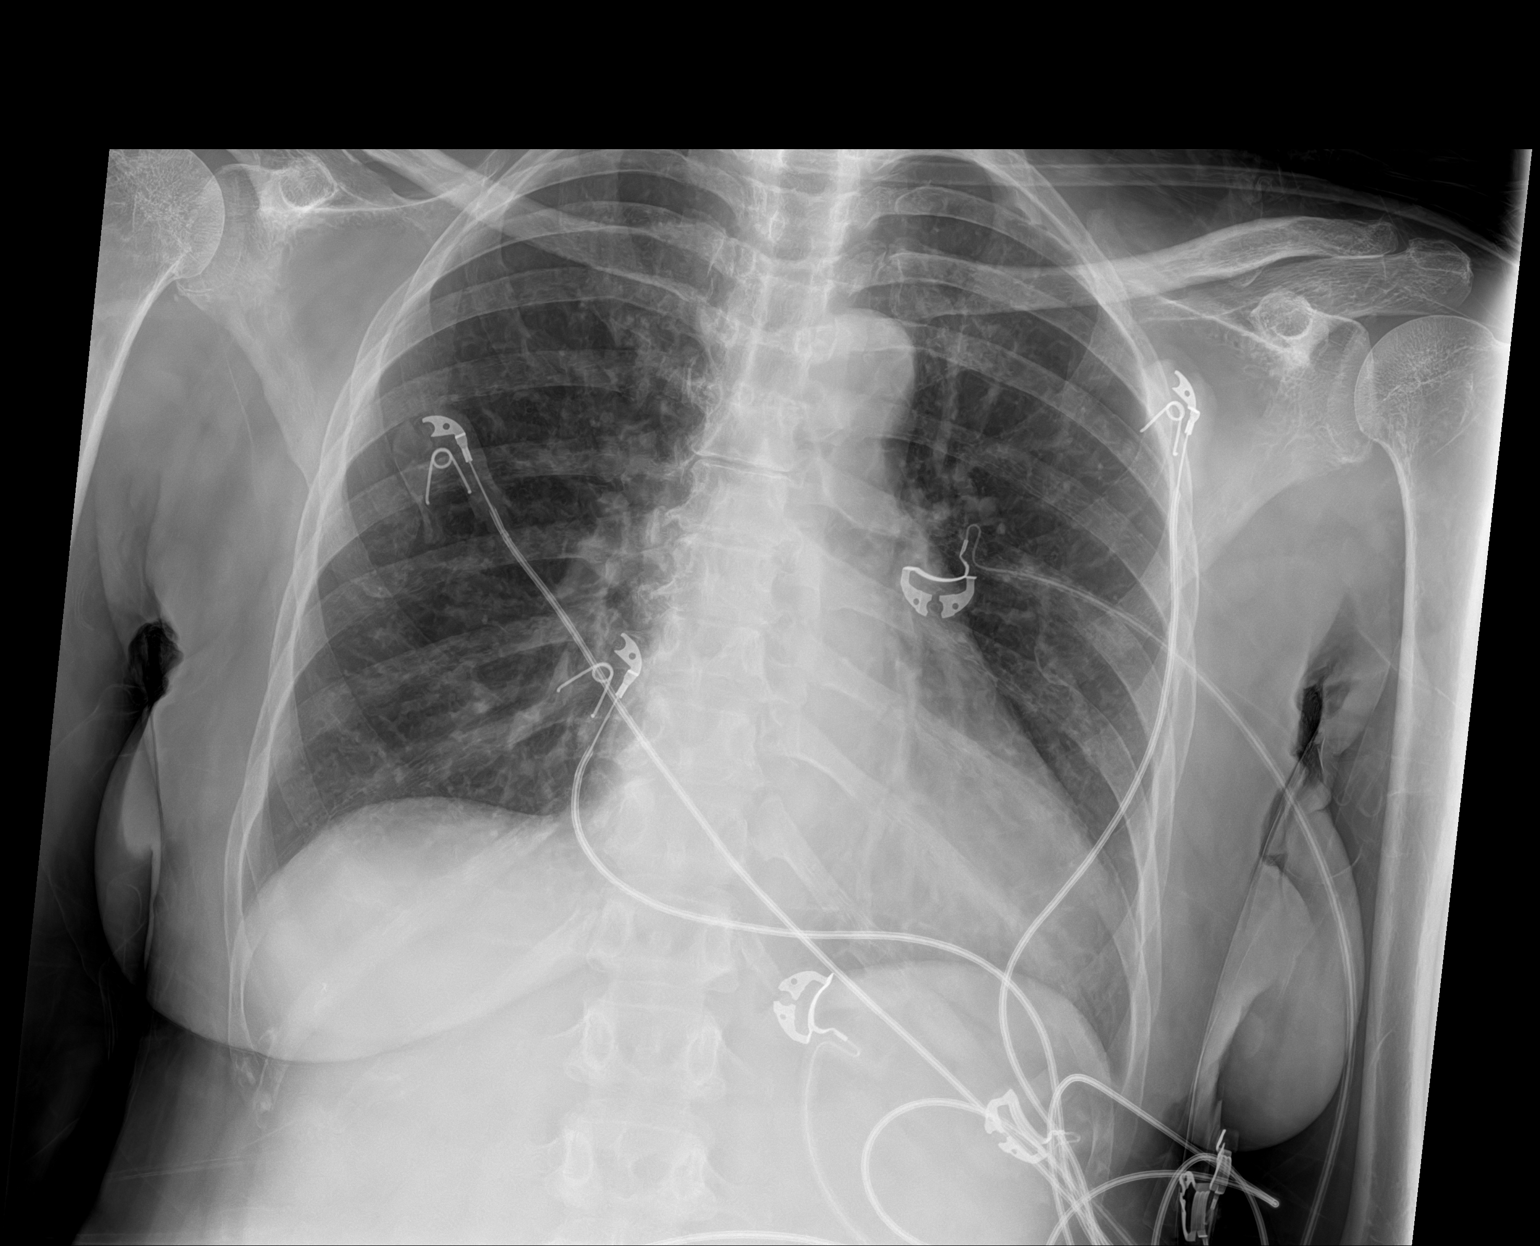

[1 of 1 positions shown; findings below may reference images not displayed]

FINDINGS: The heart size and mediastinal contours are within normal limits.
Both lungs are clear. No pneumothorax or pleural effusion is noted.
The visualized skeletal structures are unremarkable.
IMPRESSION: No active disease.
